# Patient Record
Sex: Male | Born: 1945 | Race: White | Hispanic: No | Smoking: Current every day smoker
Health system: Southern US, Community
[De-identification: ages and names within clinical notes are randomized; demographics above are authoritative.]

## PROBLEM LIST (undated history)

## (undated) DIAGNOSIS — E876 Hypokalemia: Secondary | ICD-10-CM

## (undated) DIAGNOSIS — R55 Syncope and collapse: Secondary | ICD-10-CM

## (undated) DIAGNOSIS — R269 Unspecified abnormalities of gait and mobility: Secondary | ICD-10-CM

## (undated) DIAGNOSIS — M4712 Other spondylosis with myelopathy, cervical region: Secondary | ICD-10-CM

## (undated) DIAGNOSIS — R4781 Slurred speech: Secondary | ICD-10-CM

## (undated) DIAGNOSIS — N529 Male erectile dysfunction, unspecified: Secondary | ICD-10-CM

## (undated) DIAGNOSIS — N400 Enlarged prostate without lower urinary tract symptoms: Secondary | ICD-10-CM

## (undated) DIAGNOSIS — S065X9A Traumatic subdural hemorrhage with loss of consciousness of unspecified duration, initial encounter: Secondary | ICD-10-CM

## (undated) DIAGNOSIS — G609 Hereditary and idiopathic neuropathy, unspecified: Secondary | ICD-10-CM

## (undated) DIAGNOSIS — D649 Anemia, unspecified: Secondary | ICD-10-CM

## (undated) DIAGNOSIS — K275 Chronic or unspecified peptic ulcer, site unspecified, with perforation: Secondary | ICD-10-CM

## (undated) DIAGNOSIS — K279 Peptic ulcer, site unspecified, unspecified as acute or chronic, without hemorrhage or perforation: Secondary | ICD-10-CM

## (undated) DIAGNOSIS — R1319 Other dysphagia: Secondary | ICD-10-CM

## (undated) DIAGNOSIS — G959 Disease of spinal cord, unspecified: Secondary | ICD-10-CM

## (undated) DIAGNOSIS — E785 Hyperlipidemia, unspecified: Secondary | ICD-10-CM

## (undated) DIAGNOSIS — M1990 Unspecified osteoarthritis, unspecified site: Secondary | ICD-10-CM

## (undated) DIAGNOSIS — Z720 Tobacco use: Secondary | ICD-10-CM

## (undated) DIAGNOSIS — F419 Anxiety disorder, unspecified: Secondary | ICD-10-CM

## (undated) HISTORY — DX: Hypokalemia: E87.6

## (undated) HISTORY — DX: Anemia, unspecified: D64.9

## (undated) HISTORY — DX: Male erectile dysfunction, unspecified: N52.9

## (undated) HISTORY — DX: Anxiety disorder, unspecified: F41.9

## (undated) HISTORY — PX: HEMORRHOID SURGERY: SHX153

## (undated) HISTORY — DX: Disease of spinal cord, unspecified: G95.9

## (undated) HISTORY — DX: Other dysphagia: R13.19

## (undated) HISTORY — DX: Hereditary and idiopathic neuropathy, unspecified: G60.9

## (undated) HISTORY — DX: Unspecified abnormalities of gait and mobility: R26.9

## (undated) HISTORY — PX: PARTIAL GASTRECTOMY: SHX2172

## (undated) HISTORY — DX: Tobacco use: Z72.0

## (undated) HISTORY — DX: Benign prostatic hyperplasia without lower urinary tract symptoms: N40.0

## (undated) HISTORY — DX: Traumatic subdural hemorrhage with loss of consciousness of unspecified duration, initial encounter: S06.5X9A

## (undated) HISTORY — DX: Peptic ulcer, site unspecified, unspecified as acute or chronic, without hemorrhage or perforation: K27.9

## (undated) HISTORY — DX: Unspecified osteoarthritis, unspecified site: M19.90

## (undated) HISTORY — DX: Slurred speech: R47.81

## (undated) HISTORY — DX: Syncope and collapse: R55

## (undated) HISTORY — DX: Hyperlipidemia, unspecified: E78.5

## (undated) HISTORY — DX: Other spondylosis with myelopathy, cervical region: M47.12

---

## 2002-12-27 ENCOUNTER — Encounter: Admission: RE | Admit: 2002-12-27 | Discharge: 2002-12-27 | Payer: Self-pay | Admitting: Family Medicine

## 2003-01-12 ENCOUNTER — Ambulatory Visit (HOSPITAL_COMMUNITY): Admission: RE | Admit: 2003-01-12 | Discharge: 2003-01-12 | Payer: Self-pay | Admitting: Family Medicine

## 2003-01-23 ENCOUNTER — Emergency Department (HOSPITAL_COMMUNITY): Admission: EM | Admit: 2003-01-23 | Discharge: 2003-01-23 | Payer: Self-pay | Admitting: Emergency Medicine

## 2003-03-21 ENCOUNTER — Encounter: Admission: RE | Admit: 2003-03-21 | Discharge: 2003-03-30 | Payer: Self-pay | Admitting: Occupational Medicine

## 2003-04-05 ENCOUNTER — Encounter: Admission: RE | Admit: 2003-04-05 | Discharge: 2003-04-05 | Payer: Self-pay | Admitting: Family Medicine

## 2003-07-03 ENCOUNTER — Ambulatory Visit (HOSPITAL_BASED_OUTPATIENT_CLINIC_OR_DEPARTMENT_OTHER): Admission: RE | Admit: 2003-07-03 | Discharge: 2003-07-03 | Payer: Self-pay | Admitting: Orthopedic Surgery

## 2004-03-22 ENCOUNTER — Ambulatory Visit: Payer: Self-pay | Admitting: Family Medicine

## 2008-02-12 ENCOUNTER — Emergency Department (HOSPITAL_COMMUNITY): Admission: EM | Admit: 2008-02-12 | Discharge: 2008-02-13 | Payer: Self-pay | Admitting: Emergency Medicine

## 2008-03-03 ENCOUNTER — Encounter: Admission: RE | Admit: 2008-03-03 | Discharge: 2008-03-03 | Payer: Self-pay | Admitting: Neurology

## 2008-03-22 ENCOUNTER — Inpatient Hospital Stay (HOSPITAL_COMMUNITY): Admission: RE | Admit: 2008-03-22 | Discharge: 2008-03-24 | Payer: Self-pay | Admitting: Neurosurgery

## 2008-07-18 ENCOUNTER — Encounter: Admission: RE | Admit: 2008-07-18 | Discharge: 2008-07-18 | Payer: Self-pay | Admitting: Internal Medicine

## 2008-08-22 ENCOUNTER — Encounter: Payer: Self-pay | Admitting: Internal Medicine

## 2008-08-25 ENCOUNTER — Emergency Department (HOSPITAL_COMMUNITY): Admission: EM | Admit: 2008-08-25 | Discharge: 2008-08-25 | Payer: Self-pay | Admitting: Emergency Medicine

## 2008-09-06 ENCOUNTER — Ambulatory Visit: Payer: Self-pay | Admitting: Internal Medicine

## 2008-09-06 DIAGNOSIS — D649 Anemia, unspecified: Secondary | ICD-10-CM

## 2008-09-06 DIAGNOSIS — R1319 Other dysphagia: Secondary | ICD-10-CM

## 2008-09-06 DIAGNOSIS — D539 Nutritional anemia, unspecified: Secondary | ICD-10-CM | POA: Insufficient documentation

## 2008-09-06 HISTORY — DX: Anemia, unspecified: D64.9

## 2008-09-06 HISTORY — DX: Other dysphagia: R13.19

## 2008-09-13 ENCOUNTER — Telehealth (INDEPENDENT_AMBULATORY_CARE_PROVIDER_SITE_OTHER): Payer: Self-pay | Admitting: *Deleted

## 2008-09-14 ENCOUNTER — Ambulatory Visit: Payer: Self-pay | Admitting: Internal Medicine

## 2008-09-14 LAB — CONVERTED CEMR LAB
Ferritin: 249.9 ng/mL (ref 22.0–322.0)
Folate: 14.8 ng/mL
Iron: 80 ug/dL (ref 42–165)
Saturation Ratios: 29.5 % (ref 20.0–50.0)
Transferrin: 194 mg/dL — ABNORMAL LOW (ref 212.0–360.0)
Vitamin B-12: 564 pg/mL (ref 211–911)

## 2008-09-18 ENCOUNTER — Encounter: Payer: Self-pay | Admitting: Internal Medicine

## 2008-09-22 ENCOUNTER — Ambulatory Visit (HOSPITAL_COMMUNITY): Admission: RE | Admit: 2008-09-22 | Discharge: 2008-09-22 | Payer: Self-pay | Admitting: Internal Medicine

## 2008-09-22 ENCOUNTER — Ambulatory Visit: Payer: Self-pay | Admitting: Internal Medicine

## 2009-02-24 HISTORY — PX: BACK SURGERY: SHX140

## 2010-01-14 ENCOUNTER — Observation Stay (HOSPITAL_COMMUNITY): Admission: EM | Admit: 2010-01-14 | Discharge: 2010-01-16 | Payer: Self-pay | Admitting: Emergency Medicine

## 2010-01-15 ENCOUNTER — Encounter (INDEPENDENT_AMBULATORY_CARE_PROVIDER_SITE_OTHER): Payer: Self-pay | Admitting: Internal Medicine

## 2010-03-18 ENCOUNTER — Encounter: Payer: Self-pay | Admitting: Internal Medicine

## 2010-03-28 NOTE — Miscellaneous (Signed)
Summary: Waiver of Liability/[employer], industrial/product of Liability/[facility]   Imported By: Lester Manito 09/08/2008 09:59:25  _____________________________________________________________________  External Attachment:    Type:   Image     Comment:   External Document

## 2010-05-07 LAB — CK TOTAL AND CKMB (NOT AT ARMC)
CK, MB: 5.8 ng/mL — ABNORMAL HIGH (ref 0.3–4.0)
CK, MB: 7.4 ng/mL (ref 0.3–4.0)
Relative Index: 1.6 (ref 0.0–2.5)
Relative Index: 1.7 (ref 0.0–2.5)
Total CK: 380 U/L — ABNORMAL HIGH (ref 7–232)
Total CK: 470 U/L — ABNORMAL HIGH (ref 7–232)

## 2010-05-07 LAB — BASIC METABOLIC PANEL
BUN: 4 mg/dL — ABNORMAL LOW (ref 6–23)
BUN: 6 mg/dL (ref 6–23)
BUN: 6 mg/dL (ref 6–23)
CO2: 24 mEq/L (ref 19–32)
CO2: 26 mEq/L (ref 19–32)
CO2: 27 mEq/L (ref 19–32)
Calcium: 8.8 mg/dL (ref 8.4–10.5)
Calcium: 8.9 mg/dL (ref 8.4–10.5)
Chloride: 110 mEq/L (ref 96–112)
Chloride: 110 mEq/L (ref 96–112)
Creatinine, Ser: 0.75 mg/dL (ref 0.4–1.5)
Creatinine, Ser: 0.98 mg/dL (ref 0.4–1.5)
GFR calc Af Amer: >60 mL/min (ref >60)
GFR calc Af Amer: >60 mL/min (ref >60)
GFR calc non Af Amer: >60 mL/min (ref >60)
GFR calc non Af Amer: >60 mL/min (ref >60)
Glucose, Bld: 79 mg/dL (ref 70–99)
Glucose, Bld: 85 mg/dL (ref 70–99)
Glucose, Bld: 98 mg/dL (ref 70–99)
Potassium: 3.7 mEq/L (ref 3.5–5.1)
Potassium: 3.8 mEq/L (ref 3.5–5.1)
Potassium: 4 mEq/L (ref 3.5–5.1)
Sodium: 139 mEq/L (ref 135–145)
Sodium: 142 mEq/L (ref 135–145)
Sodium: 142 mEq/L (ref 135–145)

## 2010-05-07 LAB — POCT I-STAT, CHEM 8
BUN: 5 mg/dL — ABNORMAL LOW (ref 6–23)
Calcium, Ion: 1.09 mmol/L — ABNORMAL LOW (ref 1.12–1.32)
Chloride: 109 mEq/L (ref 96–112)
Creatinine, Ser: 1.2 mg/dL (ref 0.4–1.5)
Glucose, Bld: 87 mg/dL (ref 70–99)
HCT: 36 % — ABNORMAL LOW (ref 39.0–52.0)
Hemoglobin: 12.2 g/dL — ABNORMAL LOW (ref 13.0–17.0)
Potassium: 4 mEq/L (ref 3.5–5.1)
Sodium: 140 mEq/L (ref 135–145)
TCO2: 22 mmol/L (ref 0–100)

## 2010-05-07 LAB — DIFFERENTIAL
Basophils Absolute: 0 10*3/uL (ref 0.0–0.1)
Basophils Relative: 0 % (ref 0–1)
Eosinophils Absolute: 0.2 10*3/uL (ref 0.0–0.7)
Eosinophils Relative: 2 % (ref 0–5)
Lymphocytes Relative: 17 % (ref 12–46)
Lymphs Abs: 1.7 10*3/uL (ref 0.7–4.0)
Monocytes Absolute: 0.7 10*3/uL (ref 0.1–1.0)
Monocytes Relative: 7 % (ref 3–12)
Neutro Abs: 7.3 10*3/uL (ref 1.7–7.7)
Neutrophils Relative %: 74 % (ref 43–77)

## 2010-05-07 LAB — CBC
HCT: 34.4 % — ABNORMAL LOW (ref 39.0–52.0)
HCT: 34.7 % — ABNORMAL LOW (ref 39.0–52.0)
HCT: 35 % — ABNORMAL LOW (ref 39.0–52.0)
Hemoglobin: 11.7 g/dL — ABNORMAL LOW (ref 13.0–17.0)
Hemoglobin: 11.8 g/dL — ABNORMAL LOW (ref 13.0–17.0)
Hemoglobin: 11.8 g/dL — ABNORMAL LOW (ref 13.0–17.0)
MCH: 33.9 pg (ref 26.0–34.0)
MCH: 34 pg (ref 26.0–34.0)
MCH: 34.1 pg — ABNORMAL HIGH (ref 26.0–34.0)
MCHC: 33.7 g/dL (ref 30.0–36.0)
MCHC: 34 g/dL (ref 30.0–36.0)
MCHC: 34 g/dL (ref 30.0–36.0)
MCV: 100 fL (ref 78.0–100.0)
MCV: 100.6 fL — ABNORMAL HIGH (ref 78.0–100.0)
Platelets: 229 10*3/uL (ref 150–400)
RBC: 3.44 MIL/uL — ABNORMAL LOW (ref 4.22–5.81)
RBC: 3.46 MIL/uL — ABNORMAL LOW (ref 4.22–5.81)
RDW: 13.7 % (ref 11.5–15.5)
RDW: 14 % (ref 11.5–15.5)
WBC: 9.9 10*3/uL (ref 4.0–10.5)

## 2010-05-07 LAB — TROPONIN I
Troponin I: 0.01 ng/mL (ref 0.00–0.06)
Troponin I: 0.01 ng/mL (ref 0.00–0.06)

## 2010-05-07 LAB — CORTISOL-AM, BLOOD: Cortisol - AM: 6.4 ug/dL (ref 4.3–22.4)

## 2010-05-07 LAB — POCT CARDIAC MARKERS
CKMB, poc: 1.9 ng/mL (ref 1.0–8.0)
Myoglobin, poc: 265 ng/mL (ref 12–200)
Troponin i, poc: <0.05 ng/mL (ref 0.00–0.09)

## 2010-05-07 LAB — TSH: TSH: 1.337 u[IU]/mL (ref 0.350–4.500)

## 2010-06-02 LAB — CBC
HCT: 34.3 % — ABNORMAL LOW (ref 39.0–52.0)
MCHC: 35 g/dL (ref 30.0–36.0)
MCV: 103.7 fL — ABNORMAL HIGH (ref 78.0–100.0)
Platelets: ADEQUATE 10*3/uL (ref 150–400)
RDW: 13.9 % (ref 11.5–15.5)

## 2010-06-02 LAB — POCT I-STAT, CHEM 8
BUN: 10 mg/dL (ref 6–23)
Calcium, Ion: 1.24 mmol/L (ref 1.12–1.32)
HCT: 35 % — ABNORMAL LOW (ref 39.0–52.0)
Hemoglobin: 11.9 g/dL — ABNORMAL LOW (ref 13.0–17.0)
Sodium: 141 mEq/L (ref 135–145)
TCO2: 27 mmol/L (ref 0–100)

## 2010-06-02 LAB — HEMOCCULT GUIAC POC 1CARD (OFFICE): Fecal Occult Bld: NEGATIVE

## 2010-06-10 LAB — CBC
HCT: 41.7 % (ref 39.0–52.0)
Platelets: 238 10*3/uL (ref 150–400)
RDW: 12.3 % (ref 11.5–15.5)
WBC: 7.9 10*3/uL (ref 4.0–10.5)

## 2010-07-09 NOTE — Op Note (Signed)
NAME:  Curtis Gentry, Curtis Gentry NO.:  [id number]   MEDICAL RECORD NO.:  [id number]          PATIENT TYPE:  INP   LOCATION:  3533                         FACILITY:  [facility]   PHYSICIAN:  Cristi Loron, M.D.DATE OF BIRTH:  1945-07-16   DATE OF PROCEDURE:  DATE OF DISCHARGE:                               OPERATIVE REPORT   BRIEF HISTORY:  The patient is a 65 year old white male who had suffered  from neck and arm pain with weakness, consistent with a cervical  myelopathy.  He was worked up with a cervical CT and MRI, which  demonstrated he had severe stenosis at C3-4, C4-5, and C6-7 and evidence  of spinal cord signal change.  I discussed the various treatment options  with the patient including surgery.  He has weighed the risks, benefits,  and alternatives of the surgery and decided to proceed with the surgery,  i.e., C3-4, C4-5, and C6-7 anterior cervical diskectomy fusion and  plating.   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   PROCEDURE:  C3-4, C4-5, C6-7 extensive anterior cervical  diskectomy/decompression; C3-4, C4-5 and C6-7 anterior interbody  arthrodesis with local morselized autograft bone and active fused bone  graft extender; insertion of C3-4, C4-5 and C6-7 interbody prosthesis  (Novel PEEK interbody prosthesis); C3, C5, and C6-7 intracervical  plating with Codman Slim-Loc titanium plate and screws.   SURGEON:  Cristi Loron, MD   ASSISTANT:  Payton Doughty, MD   ANESTHESIA:  General endotracheal.   ESTIMATED BLOOD LOSS:  100 mL.   SPECIMENS:  None.   DRAINS:  One prevertebral Jackson-Pratt drain.   COMPLICATIONS:  None.   DESCRIPTION OF PROCEDURE:  The patient was brought to the operating  by  the anesthesia team.  General endotracheal anesthesia was induced.  The  patient remained in the supine position.  A roll was placed under  shoulders and placed his neck in slight extension.  His anterior  cervical region was then prepared with Betadine scrub with Betadine  solution after we shaved with the clippers.  Sterile drapes were  applied.  I then injected the area to be incised with Marcaine with  epinephrine solution.  I used a scalpel to make a transverse incision in  the patient's left anterior neck.  I used a Metzenbaum scissors to  divide the platysma muscle and then to dissect medial to  sternocleidomastoid muscle, jugular vein, and carotid artery.  I  carefully dissected down towards the anterior cervical spine identifying  the esophagus and retracting it medially.  I then used skin swabs to  clear the soft tissue from the anterior cervical spine, and then  inserted a bent spinal needle into the upper exposed intervertebral disk  space.  We obtained intraoperative radiograph to confirm our location.  I then used electrocautery to detach the medial border of the longus  colli muscle bilaterally from the C3-4, C4-5, C6-7  intervertebral disk  space.  We inserted a Caspar self-retaining retractor underneath the  longus colli muscle bilaterally to provide exposure.  We began  decompression at C3-4.  I incised the C3-4 intervertebral disk with a 15-  blade scalpel and performed a partial intervertebral diskectomy with a  pituitary forceps and Carlen curettes.  I inserted traction screws to C3-  4, distracted interspace, and then used a high-speed drill to  decorticate the vertebral endplates, drilled away the remaining  intervertebral disk to thin out the posterior longitudinal ligament, and  drill away some posterior spondylosis.  We then incised the thinned out  ligament with arachnoid knife and then removed it with Kerrison punch  undercutting the vertebral endplates at  C3-4 decompressing the thecal  sac.  We then performed foraminotomies above the bilateral C4 nerve root  completing the decompression at this level.   We then repeated this decompression in an analogous fashion at C4-5 and  C6-7, decompressing the C4-5, C6-7 thecal sac  and a bilateral C5 and C7  nerve roots, this completed our decompression.  We now turned our  attention to the arthrodesis.  We used the trial spacers and determined  the use a medium 6-mm Novel PEEK interbody prosthesis at C3-4 and C4-5,  and a medium 7-mm prosthesis at C6-7.  We prefilled this prosthesis with  combination of local autograft bone we obtained during the decompression  as well as Actifuse bone graft extender.  We inserted the prosthesis  into distracted interspaces and then removed distraction screws.  There  was a good snug fit of the prosthesis in each level.   We now turned the attention to instrumentation.  We obtained appropriate  length Codman Slim-Loc anterior cervical plate and bent to the  appropriate lordosis.  We laid it along the vertebral bodies from C3,  C5, and at C6-7.  We then drilled to 40-mm holes at C3-4, C5, C6, and  C7.  We secured the plates to the vertebral bodies by placing two 40-mm  self-tapping screws at the C3-4, C5, and as well as C6-7.  We then  obtained intraoperative radiograph in demonstrating good position of  plate, screws, interbody prosthesis.  We therefore secured the screws  and plate by locking each cam.   We then obtained hemostasis using bipolar electrocautery.  We irrigated  the wound out with bacitracin solution.  We then removed the retractor  and inspected the esophagus for any damage, there was none apparent.  We  placed a 10-mm flat Jackson-Pratt drain in the prevertebral space and  tunneled it out through a seperate stab wound.  We then reapproximated  the patient's platysma muscle with interrupted 3-0 Vicryl suture and  subcutaneous tissue with  interrupted 3-0 Vicryl suture and skin with  Steri-Strips and Benzoin.  The wound was then coated with bacitracin  ointment.  A sterile dressing was applied.  The drapes were removed.  The patient was subsequently extubated by the anesthesia team and  transported to post anesthesia care unit in stable condition.  All  sponge, instrument, and needle counts were correct at the end of this  case.      Cristi Loron, M.D.  Electronically Signed     JDJ/MEDQ  D:  03/22/2008  T:  03/23/2008  Job:  [id number]

## 2010-07-12 NOTE — Op Note (Signed)
NAME:  TAJI, BARRETTO                           ACCOUNT NO.:  [id number]   MEDICAL RECORD NO.:  [id number]                   PATIENT TYPE:  AMB   LOCATION:  DSC                                  FACILITY:  [facility]   PHYSICIAN:  Feliberto Gottron. Turner Daniels, M.D.                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   PROCEDURE:  Left carpal tunnel release, left Guyon's canal release.   SURGEON:  Feliberto Gottron. Turner Daniels, M.D.   FIRST ASSISTANT:  Skip Mayer, P.A.-C.   ANESTHESIA:  IV regional.   ESTIMATED BLOOD LOSS:  Minimal.   FLUID REPLACEMENT:  800 mL crystalloid.   DRAINS PLACED:  None.   TOURNIQUET TIME:  20 minutes.   INDICATIONS:  This 65 year old gentleman with EMG proven severe left carpal  tunnel syndrome and ulnar nerve impingement or entrapment at Guyon's canal.  He was taken for elective decompression of both and has failed conservative  treatment with splinting and anti-inflammatory medicine and because of  excellent drop out is a candidate for a sooner than later release.   DESCRIPTION OF PROCEDURE:  The patient was identified by the arm band and  taken to the operating room at [facility].  Appropriate  anesthetic monitors were attached and IV regional anesthesia induced into  the left upper extremity.  The left hand was prepped and draped into the  usual sterile fashion from the fingertips to the forearm tourniquet.  We  began the procedure by making a volar incision starting at the wrist flexion  crease and going distally and 2 mm to the ulnar side of the thenar crease.  Smaller bleeders in the skin and subcutaneous tissue were identified and  cauterized.  The length of the incision was about 5 cm.  We found the palmar  fascia and made a small incision in it distally, passed a Therapist, nutritional  into the carpal tunnel and cut down on the [place] elevator keeping medial and  lateral traction performing a carpal tunnel release which was extended into  the forearm fascia with the black-handled scissors.  We then found the ulnar  artery and neurovascular bundle distally and traced it back to Guyon's canal  and then carefully decompressed Guyon's canal to the ulnar side of the  carpal tunnel until this bundle  was also freed up.  The wound was then irrigated out with normal saline  solution and the skin only closed with a running 5-0 nylon suture.  A  dressing of Xeroform, 4 x 4 dressings, sponges, Webril, and an Ace wrap were  applied.  The patient was then awakened and taken to the recovery room  without difficulty.                                               Feliberto Gottron. Turner Daniels, M.D.    Ovid Curd  D:  07/03/2003  T:  07/03/2003  Job:  [id number]

## 2010-11-29 LAB — BASIC METABOLIC PANEL
BUN: 8 mg/dL (ref 6–23)
GFR calc Af Amer: >60 mL/min (ref >60)
GFR calc non Af Amer: >60 mL/min (ref >60)
Potassium: 4.1 mEq/L (ref 3.5–5.1)

## 2010-11-29 LAB — DIFFERENTIAL
Lymphocytes Relative: 27 % (ref 12–46)
Lymphs Abs: 1.6 10*3/uL (ref 0.7–4.0)
Monocytes Relative: 10 % (ref 3–12)
Neutrophils Relative %: 58 % (ref 43–77)

## 2010-11-29 LAB — CBC
HCT: 45.2 % (ref 39.0–52.0)
Platelets: 198 10*3/uL (ref 150–400)
RBC: 4.12 MIL/uL — ABNORMAL LOW (ref 4.22–5.81)
WBC: 6.1 10*3/uL (ref 4.0–10.5)

## 2010-11-29 LAB — POCT CARDIAC MARKERS
Myoglobin, poc: 196 ng/mL (ref 12–200)
Troponin i, poc: <0.05 ng/mL (ref 0.00–0.09)

## 2011-08-20 ENCOUNTER — Observation Stay (HOSPITAL_COMMUNITY)
Admission: EM | Admit: 2011-08-20 | Discharge: 2011-08-21 | Disposition: A | Discharge status: Home / Self Care | Payer: Medicare Other | Attending: Internal Medicine | Admitting: Internal Medicine

## 2011-08-20 ENCOUNTER — Emergency Department (HOSPITAL_COMMUNITY): Payer: Medicare Other

## 2011-08-20 ENCOUNTER — Encounter (HOSPITAL_COMMUNITY): Payer: Self-pay

## 2011-08-20 DIAGNOSIS — R55 Syncope and collapse: Secondary | ICD-10-CM

## 2011-08-20 DIAGNOSIS — I951 Orthostatic hypotension: Secondary | ICD-10-CM

## 2011-08-20 DIAGNOSIS — F17200 Nicotine dependence, unspecified, uncomplicated: Secondary | ICD-10-CM | POA: Insufficient documentation

## 2011-08-20 DIAGNOSIS — D649 Anemia, unspecified: Secondary | ICD-10-CM

## 2011-08-20 DIAGNOSIS — R1319 Other dysphagia: Secondary | ICD-10-CM

## 2011-08-20 DIAGNOSIS — J438 Other emphysema: Secondary | ICD-10-CM | POA: Insufficient documentation

## 2011-08-20 DIAGNOSIS — E782 Mixed hyperlipidemia: Secondary | ICD-10-CM

## 2011-08-20 DIAGNOSIS — E785 Hyperlipidemia, unspecified: Secondary | ICD-10-CM | POA: Diagnosis present

## 2011-08-20 DIAGNOSIS — E876 Hypokalemia: Secondary | ICD-10-CM | POA: Diagnosis present

## 2011-08-20 HISTORY — DX: Chronic or unspecified peptic ulcer, site unspecified, with perforation: K27.5

## 2011-08-20 HISTORY — DX: Syncope and collapse: R55

## 2011-08-20 LAB — CBC
HCT: 33 % — ABNORMAL LOW (ref 39.0–52.0)
HCT: 38.2 % — ABNORMAL LOW (ref 39.0–52.0)
Hemoglobin: 12.9 g/dL — ABNORMAL LOW (ref 13.0–17.0)
MCH: 33.6 pg (ref 26.0–34.0)
MCHC: 34.5 g/dL (ref 30.0–36.0)
MCHC: 33.8 g/dL (ref 30.0–36.0)
MCV: 97.3 fL (ref 78.0–100.0)
MCV: 100.5 fL — ABNORMAL HIGH (ref 78.0–100.0)
RDW: 13.9 % (ref 11.5–15.5)
RDW: 14.1 % (ref 11.5–15.5)
WBC: 7 10*3/uL (ref 4.0–10.5)

## 2011-08-20 LAB — CREATININE, SERUM: GFR calc Af Amer: >90 mL/min (ref >90)

## 2011-08-20 LAB — CARDIAC PANEL(CRET KIN+CKTOT+MB+TROPI)
CK, MB: 3.9 ng/mL (ref 0.3–4.0)
Relative Index: INVALID (ref 0.0–2.5)
Total CK: 80 U/L (ref 7–232)

## 2011-08-20 LAB — BASIC METABOLIC PANEL
BUN: 7 mg/dL (ref 6–23)
Calcium: 8.5 mg/dL (ref 8.4–10.5)
Creatinine, Ser: 1.03 mg/dL (ref 0.50–1.35)
GFR calc Af Amer: 85 mL/min — ABNORMAL LOW (ref >90)
GFR calc non Af Amer: 74 mL/min — ABNORMAL LOW (ref >90)

## 2011-08-20 LAB — URINALYSIS, ROUTINE W REFLEX MICROSCOPIC
Bilirubin Urine: NEGATIVE
Hgb urine dipstick: NEGATIVE
Specific Gravity, Urine: 1.009 (ref 1.005–1.030)
pH: 5.5 (ref 5.0–8.0)

## 2011-08-20 LAB — MAGNESIUM: Magnesium: 1.8 mg/dL (ref 1.5–2.5)

## 2011-08-20 MED ORDER — ALBUTEROL SULFATE (5 MG/ML) 0.5% IN NEBU
2.5000 mg | INHALATION_SOLUTION | Freq: Four times a day (QID) | RESPIRATORY_TRACT | Status: DC
  Administered 2011-08-20 – 2011-08-21 (×2): 2.5 mg via RESPIRATORY_TRACT
  Filled 2011-08-20 (×2): qty 0.5

## 2011-08-20 MED ORDER — SODIUM CHLORIDE 0.9 % IV BOLUS (SEPSIS)
1000.0000 mL | Freq: Once | INTRAVENOUS | Status: AC
  Administered 2011-08-20: 1000 mL via INTRAVENOUS

## 2011-08-20 MED ORDER — ATORVASTATIN CALCIUM 10 MG PO TABS
10.0000 mg | ORAL_TABLET | Freq: Every day | ORAL | Status: DC
  Administered 2011-08-21: 10 mg via ORAL
  Filled 2011-08-20: qty 1

## 2011-08-20 MED ORDER — POLYETHYLENE GLYCOL 3350 17 G PO PACK
17.0000 g | PACK | Freq: Every day | ORAL | Status: DC | PRN
  Filled 2011-08-20: qty 1

## 2011-08-20 MED ORDER — ONDANSETRON HCL 4 MG/2ML IJ SOLN: 4.0000 mg | Freq: Four times a day (QID) | INTRAMUSCULAR | Status: DC | PRN

## 2011-08-20 MED ORDER — DIAZEPAM 5 MG PO TABS: 5.0000 mg | ORAL_TABLET | Freq: Four times a day (QID) | ORAL | Status: DC | PRN

## 2011-08-20 MED ORDER — ACETAMINOPHEN 325 MG PO TABS: 650.0000 mg | ORAL_TABLET | Freq: Four times a day (QID) | ORAL | Status: DC | PRN

## 2011-08-20 MED ORDER — SODIUM CHLORIDE 0.9 % IV SOLN
INTRAVENOUS | Status: AC
  Administered 2011-08-20: 20:00:00 via INTRAVENOUS

## 2011-08-20 MED ORDER — ONDANSETRON HCL 4 MG PO TABS: 4.0000 mg | ORAL_TABLET | Freq: Four times a day (QID) | ORAL | Status: DC | PRN

## 2011-08-20 MED ORDER — HYDROCODONE-ACETAMINOPHEN 5-325 MG PO TABS: 1.0000 | ORAL_TABLET | ORAL | Status: DC | PRN

## 2011-08-20 MED ORDER — ACETAMINOPHEN 650 MG RE SUPP: 650.0000 mg | Freq: Four times a day (QID) | RECTAL | Status: DC | PRN

## 2011-08-20 MED ORDER — SODIUM CHLORIDE 0.9 % IV SOLN: INTRAVENOUS | Status: AC

## 2011-08-20 MED ORDER — SODIUM CHLORIDE 0.9 % IJ SOLN
3.0000 mL | Freq: Two times a day (BID) | INTRAMUSCULAR | Status: DC
  Administered 2011-08-20 – 2011-08-21 (×2): 3 mL via INTRAVENOUS

## 2011-08-20 MED ORDER — HEPARIN SODIUM (PORCINE) 5000 UNIT/ML IJ SOLN
5000.0000 [IU] | Freq: Three times a day (TID) | INTRAMUSCULAR | Status: DC
  Administered 2011-08-20 – 2011-08-21 (×3): 5000 [IU] via SUBCUTANEOUS
  Filled 2011-08-20 (×5): qty 1

## 2011-08-20 NOTE — ED Provider Notes (Signed)
I saw and evaluated the patient, reviewed the resident's note and I agree with the findings and plan.   Shayda Kalka, MD 08/20/11 2351 

## 2011-08-20 NOTE — H&P (Signed)
[facility] History and Physical  Curtis Gentry ZOX:[id number] DOB: 15-Sep-1945 DOA: 08/20/2011   PCP: Hoyle Sauer, MD   Chief Complaint: Syncope and collapse  HPI:  This is a 66 year old male with no significant past medical history is ongoing tobacco abuse one pack a day that comes in for syncopal episode this afternoon. He relates he was mowing his lawn when this happened. He sat down and he light up a cigarette and then lost consciousness. His Levoxine. He was unresponsive. For about 30-50 seconds. He relates no prodromal symptoms no palpitations , chest pain shortness of breath, notes recent diarrhea, no fever or melanotic stools. There is no seizure activity no loss of control of sphincters.  Review of Systems:  Constitutional:  No weight loss, night sweats, Fevers, chills, fatigue.  HEENT:  No headaches, Difficulty swallowing,Tooth/dental problems,Sore throat,  No sneezing, itching, ear ache, nasal congestion, post nasal drip,  Cardio-vascular:  No chest pain, Orthopnea, PND, swelling in lower extremities, anasarca, dizziness, palpitations  GI:  No heartburn, indigestion, abdominal pain, nausea, vomiting, diarrhea, change in bowel habits, loss of appetite  Resp:  No shortness of breath with exertion or at rest. No excess mucus, no productive cough, No non-productive cough, No coughing up of blood.No change in color of mucus.No wheezing.No chest wall deformity  Skin:  no rash or lesions.  GU:  no dysuria, change in color of urine, no urgency or frequency. No flank pain.  Musculoskeletal:  No joint pain or swelling. No decreased range of motion. No back pain.  Psych:  No change in mood or affect. No depression or anxiety. No memory loss.    Past Medical History  Diagnosis Date  . Perforated ulcer    Past Surgical History  Procedure Date  . Back surgery    Social History:  reports that he has been smoking.  He does not have any smokeless tobacco history on file.  He reports that he does not drink alcohol or use illicit drugs.  No Known Allergies  Family History  Problem Relation Age of Onset  . Stroke Mother   . COPD Father   . Heart attack Father     Prior to Admission medications   Medication Sig Start Date End Date Taking? Authorizing Provider  atorvastatin (LIPITOR) 10 MG tablet Take 10 mg by mouth daily.   Yes Historical Provider, MD  diazepam (VALIUM) 5 MG tablet Take 5 mg by mouth every 6 (six) hours as needed. For nerves.   Yes Historical Provider, MD  HYDROcodone-acetaminophen (VICODIN) 5-500 MG per tablet Take 1 tablet by mouth 4 (four) times daily as needed. For pain.   Yes Historical Provider, MD   Physical Exam: Filed Vitals:   08/20/11 1516 08/20/11 1518 08/20/11 1520 08/20/11 1530  BP: 61/47 59/46 96/58  92/61  Pulse: 73 81  71  Temp:      TempSrc:      Resp:    21  SpO2:    98%   BP 92/61  Pulse 71  Temp 97.3 F (36.3 C) (Oral)  Resp 21  SpO2 98%  General Appearance:    Alert, cooperative, no distress,  thin appearing appearsolder than his age   Head:    Normocephalic, without obvious abnormality, atraumatic  Eyes:    PERRL, conjunctiva/corneas clear, EOM's intact, fundi    benign, both eyes       Ears:    Normal TM's and external ear canals, both ears  Nose:   Nares normal, septum  midline, mucosa normal, no drainage    or sinus tenderness  Throat:  dry lips and mucosa.   Neck:   Supple, symmetrical, trachea midline, no adenopathy;       thyroid:  No enlargement/tenderness/nodules; no carotid   bruit or JVD  Back:     Symmetric, no curvature, ROM normal, no CVA tenderness  Lungs:     Clear to auscultation bilaterally, respirations unlabored  Chest wall:    No tenderness or deformity  Heart:    Regular rate and rhythm, S1 and S2 normal, no murmur, rub   or gallop  Abdomen:     Soft, non-tender, bowel sounds active all four quadrants,    no masses, no organomegaly        Extremities:   Extremities normal,  atraumatic, no cyanosis or edema  Pulses:   2+ and symmetric all extremities  Skin:   Skin color, texture, turgor normal, no rashes or lesions  Lymph nodes:   Cervical, supraclavicular, and axillary nodes normal  Neurologic:   CNII-XII intact. Normal strength, sensation and reflexes      throughout    Labs on Admission:  Basic Metabolic Panel:  Lab 08/20/11 1610  NA 139  K 3.7  CL 106  CO2 23  GLUCOSE 101*  BUN 7  CREATININE 1.03  CALCIUM 8.5  MG --  PHOS --   Liver Function Tests: No results found for this basename: AST:5,ALT:5,ALKPHOS:5,BILITOT:5,PROT:5,ALBUMIN:5 in the last 168 hours No results found for this basename: LIPASE:5,AMYLASE:5 in the last 168 hours No results found for this basename: AMMONIA:5 in the last 168 hours CBC:  Lab 08/20/11 1435  WBC 8.0  NEUTROABS --  HGB 11.4*  HCT 33.0*  MCV 97.3  PLT 189   Cardiac Enzymes: No results found for this basename: CKTOTAL:5,CKMB:5,CKMBINDEX:5,TROPONINI:5 in the last 168 hours BNP: No components found with this basename: POCBNP:5 CBG: No results found for this basename: GLUCAP:5 in the last 168 hours  Radiological Exams on Admission: Dg Chest 2 View  08/20/2011  *RADIOLOGY REPORT*  Clinical Data: Loss of consciousness.  Smoking history.  CHEST - 2 VIEW  Comparison: 01/14/2010  Findings: The lungs are hyperinflated with scarring consistent with diffuse emphysema.  No evidence of infiltrate, collapse or effusion.  Heart size is normal.  Mediastinal shadows are unremarkable.  No significant bony finding.  IMPRESSION: Severe diffuse emphysema.  No acute process evident.  Original Report Authenticated By: Thomasenia Sales, M.D.    EKG: Independently reviewed. Normal sinus rhythm  Assessment/Plan: Principal Problem:  *Syncope and collapse: Is most likely secondary to orthostatic hypotension. His blood pressure was low at the scene he was given a 250 cc bolus bthe EMS staff. Orthostatics were done in the ED that was  positive. The omega bolus. At this time his creatinine is at baseline as is his BUN. We'll go ahead and give him another bolus of IV fluids and continue him on 125 cc an hour for the next 24 hours. Will admit to telemetry check for any cardiac events, this is unlikely. We'll check a TSH and a course alone the morning. We'll check strict I.'s and O.'s and we'll cycle his cardiac enzymes. At this time he doesn't have a murmur. His loss of consciousness is not exertional. So I doubt this aortic stenosis.   Hyperlipidemia Continue statin  Time spend: 30 minute Code Status: Full code Family Communication: Clearence Cheek [phone] Disposition Plan: inpatient  Marinda Elk, MD  [facility] Pager [phone]  If 7PM-7AM, please contact night-coverage [email or web address] Password [facility] 08/20/2011, 4:34 PM

## 2011-08-20 NOTE — ED Provider Notes (Signed)
History     CSN: [id number]  Arrival date & time 08/20/11  1356   First MD Initiated Contact with Patient 08/20/11 1505      Chief Complaint  Patient presents with  . Loss of Consciousness    Patient is a 67 y.o. male presenting with syncope. The history is provided by the patient, the spouse and a relative.  Loss of Consciousness This is a new problem. The current episode started today (at approximately noon). Episode frequency: once today, while smoking a cigarette. The problem has been resolved. Pertinent negatives include no abdominal pain, chest pain, chills, coughing, fever, headaches, nausea, neck pain, numbness, rash, vomiting or weakness. Exacerbated by: Patient had been mowing his lawn this morning and then sat down aqnd was smoking a cigarette outside when he syncopized. No prodromal signs or symptoms. Patient is asymptomatic now.  He has tried rest (250 mL NS bolus) for the symptoms. The treatment provided moderate relief.    Past Medical History  Diagnosis Date  . Perforated ulcer     Past Surgical History  Procedure Date  . Back surgery     History reviewed. No pertinent family history.  History  Substance Use Topics  . Smoking status: Current Everyday Smoker -- 1.0 packs/day  . Smokeless tobacco: Not on file  . Alcohol Use: No      Review of Systems  Constitutional: Negative for fever, chills, activity change and appetite change.  HENT: Negative for neck pain and neck stiffness.   Respiratory: Negative for cough and shortness of breath.   Cardiovascular: Positive for syncope. Negative for chest pain, palpitations and leg swelling.  Gastrointestinal: Negative for nausea, vomiting, abdominal pain, diarrhea and constipation.  Skin: Negative for rash and wound.  Neurological: Positive for syncope. Negative for dizziness, seizures, facial asymmetry, speech difficulty, weakness, light-headedness, numbness and headaches.  All other systems reviewed and are  negative.    Allergies  Review of patient's allergies indicates no known allergies.  Home Medications   Current Outpatient Rx  Name Route Sig Dispense Refill  . ATORVASTATIN CALCIUM 10 MG PO TABS Oral Take 10 mg by mouth daily.    Marland Kitchen DIAZEPAM 5 MG PO TABS Oral Take 5 mg by mouth every 6 (six) hours as needed. For nerves.    Marland Kitchen HYDROCODONE-ACETAMINOPHEN 5-500 MG PO TABS Oral Take 1 tablet by mouth 4 (four) times daily as needed. For pain.      BP 96/58  Pulse 81  Temp 97.3 F (36.3 C) (Oral)  Resp 16  SpO2 98%  Physical Exam  Nursing note and vitals reviewed. Constitutional: He is oriented to person, place, and time. He appears well-developed and well-nourished.  HENT:  Head: Normocephalic and atraumatic.  Right Ear: External ear normal.  Left Ear: External ear normal.  Nose: Nose normal.  Mouth/Throat: Oropharynx is clear and moist. No oropharyngeal exudate.  Eyes: Conjunctivae are normal. Pupils are equal, round, and reactive to light.  Neck: Normal range of motion. Neck supple.  Cardiovascular: Normal rate, regular rhythm, normal heart sounds and intact distal pulses.   Pulmonary/Chest: Effort normal. No respiratory distress. He has wheezes. He has no rales. He exhibits no tenderness.  Abdominal: Soft. Bowel sounds are normal. He exhibits no distension and no mass. There is no tenderness. There is no rebound and no guarding.  Musculoskeletal: Normal range of motion. He exhibits no edema and no tenderness.  Neurological: He is alert and oriented to person, place, and time. He displays normal reflexes.  No cranial nerve deficit. He exhibits normal muscle tone. Coordination normal.       Decreased sensation to hands bilaterally, which patient states is at its baseline since a surgery to his upper back   Skin: Skin is warm and dry. No rash noted. No erythema. No pallor.  Psychiatric: He has a normal mood and affect. His behavior is normal. Judgment and thought content normal.     ED Course  Procedures (including critical care time)  Labs Reviewed  CBC - Abnormal; Notable for the following:    RBC 3.39 (*)     Hemoglobin 11.4 (*)     HCT 33.0 (*)     All other components within normal limits  BASIC METABOLIC PANEL - Abnormal; Notable for the following:    Glucose, Bld 101 (*)     GFR calc non Af Amer 74 (*)     GFR calc Af Amer 85 (*)     All other components within normal limits  POCT I-STAT TROPONIN I  URINALYSIS, ROUTINE W REFLEX MICROSCOPIC   Dg Chest 2 View  08/20/2011  *RADIOLOGY REPORT*  Clinical Data: Loss of consciousness.  Smoking history.  CHEST - 2 VIEW  Comparison: 01/14/2010  Findings: The lungs are hyperinflated with scarring consistent with diffuse emphysema.  No evidence of infiltrate, collapse or effusion.  Heart size is normal.  Mediastinal shadows are unremarkable.  No significant bony finding.  IMPRESSION: Severe diffuse emphysema.  No acute process evident.  Original Report Authenticated By: Thomasenia Sales, M.D.     1. Syncope   2. Orthostatic syncope      Date: 08/20/2011  Rate: 50 bpm  Rhythm: sinus bradycardia  QRS Axis: normal  Intervals: normal  ST/T Wave abnormalities: normal  Conduction Disutrbances:none  Narrative Interpretation: Sinus bradycardia without evidence of ischemia, arrythmia, or heart block  Old EKG Reviewed: unchanged    MDM  66 yo M presents after syncopal episode. No prodromal symptoms and patient awoke and returned to his baseline alertness quickly. Patient did not hit head but slumped over while smoking a cigarette. No burns from the cigarette. Patient's systolic blood pressure was 84 on scene, which improved to 112 with a NS bolus. SBP 93 here. No focal neuro signs or symptoms and picture not concerning for CVA; head CT not indicated. No dyspnea or chest pain and clinical picture not concerning for PE or ACS. Orthostatics positive and pt's response to IVF also suggests dehydration. Will continue  to rehydrate. Hospitalist service consulted and will admit.         Clemetine Marker, MD 08/20/11 1701

## 2011-08-20 NOTE — ED Notes (Signed)
Per ems- pt experienced syncopal episode at home while sitting on front porch. Pt did not fall. Per family pt was out for . Pt did urinate on self. Pt initial BP was 84 systolic. Pt given 250 bolus and BP went up to 112/58. Pt was alert and oriented entire trip with EMS. Pt c/o bilateral arm pain and dizziness prior to syncopal episode. Denies any cardiac history.

## 2011-08-20 NOTE — ED Notes (Signed)
Patient transported to X-ray 

## 2011-08-20 NOTE — ED Provider Notes (Signed)
66 year old male had a syncopal episode at home. He had been using the weed whack her and was sitting down the porch following that drinking some iced tea and smoking a cigarette when he was noted to pass out. He was not dizzy before then he denies any usual amount of sweating. Denies any chest pain. He did have urinary incontinence but no fecal incontinence. He did have a prior syncopal episode where he was admitted and had no clear cause was found. He was noted to be hypotensive in the field and in the ED, he is noted to have marked orthostatic drop in blood pressure. On exam, lungs are clear and heart has regular rate and rhythm. He is noted to have muscle wasting of the intrinsic muscles of his hands which he relates to being a sequelae of neck surgery. Old records were reviewed and indeed he was admitted in November of 2011 for syncope with no clear cause being found. He will need to be admitted for further workup. Also, his bradycardia is inappropriate for her degree of hypotension and may indicate some degree of sick sinus syndrome.   Date: 08/20/2011  Rate: 50  Rhythm: sinus bradycardia  QRS Axis: normal  Intervals: normal  ST/T Wave abnormalities: normal  Conduction Disutrbances:none  Narrative Interpretation: Sinus bradycardia, otherwise normal ECG. No old ECG available for comparison.  Old EKG Reviewed: unchanged    Dione Booze, MD 08/20/11 [phone]

## 2011-08-20 NOTE — ED Notes (Signed)
Pt states that he was resting, felt dizzy and then experienced syncopal episode. Pt states that he had this happen 1 year ago and "my doctor didn't find anything wrong with me." Pt in NAD. Pt states he has Bilateral arm pain but that is normal for him since he had back surgery. Denies any new complaints.

## 2011-08-20 NOTE — ED Notes (Signed)
Pt given peanut butter & crackers. 

## 2011-08-21 DIAGNOSIS — E876 Hypokalemia: Secondary | ICD-10-CM

## 2011-08-21 DIAGNOSIS — R55 Syncope and collapse: Secondary | ICD-10-CM

## 2011-08-21 DIAGNOSIS — E782 Mixed hyperlipidemia: Secondary | ICD-10-CM

## 2011-08-21 HISTORY — DX: Hypokalemia: E87.6

## 2011-08-21 LAB — VITAMIN B12: Vitamin B-12: 766 pg/mL (ref 211–911)

## 2011-08-21 LAB — COMPREHENSIVE METABOLIC PANEL
Albumin: 2.7 g/dL — ABNORMAL LOW (ref 3.5–5.2)
BUN: 5 mg/dL — ABNORMAL LOW (ref 6–23)
Calcium: 7.6 mg/dL — ABNORMAL LOW (ref 8.4–10.5)
Creatinine, Ser: 0.86 mg/dL (ref 0.50–1.35)
GFR calc Af Amer: >90 mL/min (ref >90)
Total Protein: 4.9 g/dL — ABNORMAL LOW (ref 6.0–8.3)

## 2011-08-21 LAB — CBC
HCT: 32.8 % — ABNORMAL LOW (ref 39.0–52.0)
MCH: 32.7 pg (ref 26.0–34.0)
MCHC: 32.9 g/dL (ref 30.0–36.0)
MCV: 99.4 fL (ref 78.0–100.0)
RDW: 14.3 % (ref 11.5–15.5)

## 2011-08-21 LAB — CARDIAC PANEL(CRET KIN+CKTOT+MB+TROPI): Total CK: 73 U/L (ref 7–232)

## 2011-08-21 MED ORDER — POTASSIUM CHLORIDE CRYS ER 20 MEQ PO TBCR
40.0000 meq | EXTENDED_RELEASE_TABLET | Freq: Three times a day (TID) | ORAL | Status: AC
  Administered 2011-08-21 (×3): 40 meq via ORAL
  Filled 2011-08-21 (×3): qty 2

## 2011-08-21 MED ORDER — ALBUTEROL SULFATE (5 MG/ML) 0.5% IN NEBU: 2.5000 mg | INHALATION_SOLUTION | Freq: Two times a day (BID) | RESPIRATORY_TRACT | Status: DC

## 2011-08-21 MED ORDER — POTASSIUM CHLORIDE 20 MEQ/15ML (10%) PO LIQD
40.0000 meq | Freq: Every day | ORAL | Status: DC
  Administered 2011-08-21: 40 meq via ORAL
  Filled 2011-08-21: qty 30

## 2011-08-21 MED ORDER — SODIUM CHLORIDE 0.9 % IV BOLUS (SEPSIS)
500.0000 mL | Freq: Once | INTRAVENOUS | Status: AC
  Administered 2011-08-21: 500 mL via INTRAVENOUS

## 2011-08-21 NOTE — Discharge Summary (Signed)
Physician Discharge Summary  Curtis Gentry ZOX:[id number] DOB: 08-14-1945 DOA: 08/20/2011  PCP: Hoyle Sauer, MD  Admit date: 08/20/2011 Discharge date: 08/21/2011  Discharge Diagnoses:  Principal Problem:  *Syncope and collapse Active Problems:  Hyperlipidemia   Discharge Condition: Stable at time of discharge  Disposition:   Diet: Regular diet  History of present illness:  66 year old male with no significant past medical history with ongoing tobacco abuse one pack a day that comes in for syncopal episode this afternoon. He relates he was mowing his lawn when this happened. He sat down and he light up a cigarette and then lost consciousness. His Levoxine. He was unresponsive. For about 30-50 seconds. He relates no prodromal symptoms no palpitations , chest pain shortness of breath, notes recent diarrhea, no fever or melanotic stools. There is no seizure activity no loss of control of sphincters.   Hospital Course:  Syncope and collapse: He was admitted to the telemetry unit and monitored overnight orthostatics were checked in the emergency room which were positive he was given a liter bolus and his orthostasis does not improve. An EKG was done that showed normal sinus rhythm, in telemetry he was monitored overnight and he remained in normal sinus rhythm with a couple PVCs. Cardiac enzymes were checked which were negative x2, his creatinine dropped from 1.1-0.8. Which assures that he was significantly intravascularly depleted. I vitamin B12 was checked which was 766 TSH was within normal limits. After being overnight at 150 cc of normal saline and a positive urine output orthostatics were checked which were negative. He was informed of the results and was discharged in stable condition.  Hypokalemia: After IV hydration overnight his potassium dropped to 3.1 it was repleted her magnesium was checked and it was within normal. This probably secondary to decreased intravascular  volume. Discharge Exam: Filed Vitals:   08/21/11 1400  BP: 117/71  Pulse: 55  Temp: 97.9 F (36.6 C)  Resp:    Filed Vitals:   08/21/11 1205 08/21/11 1206 08/21/11 1209 08/21/11 1400  BP: 105/55 94/60 96/63  117/71  Pulse: 79 71 90 55  Temp:    97.9 F (36.6 C)  TempSrc:    Oral  Resp:      Height:      Weight:      SpO2:    94%   General: Alert and oriented x3 in no acute distress Cardiovascular: Regular rate and rhythm with positive S1-S2 no murmurs rubs gallops Respiratory: Good air movement clear to auscultation.  Discharge Instructions  Discharge Orders    Future Orders Please Complete By Expires   Diet - low sodium heart healthy      Increase activity slowly        Medication List  As of 08/21/2011  4:07 PM   TAKE these medications         atorvastatin 10 MG tablet   Commonly known as: LIPITOR   Take 10 mg by mouth daily.      diazepam 5 MG tablet   Commonly known as: VALIUM   Take 5 mg by mouth every 6 (six) hours as needed. For nerves.      HYDROcodone-acetaminophen 5-500 MG per tablet   Commonly known as: VICODIN   Take 1 tablet by mouth 4 (four) times daily as needed. For pain.              The results of significant diagnostics from this hospitalization (including imaging, microbiology, ancillary and laboratory) are listed below for reference.  Significant Diagnostic Studies: Dg Chest 2 View  08/20/2011  *RADIOLOGY REPORT*  Clinical Data: Loss of consciousness.  Smoking history.  CHEST - 2 VIEW  Comparison: 01/14/2010  Findings: The lungs are hyperinflated with scarring consistent with diffuse emphysema.  No evidence of infiltrate, collapse or effusion.  Heart size is normal.  Mediastinal shadows are unremarkable.  No significant bony finding.  IMPRESSION: Severe diffuse emphysema.  No acute process evident.  Original Report Authenticated By: Thomasenia Sales, M.D.    Microbiology: No results found for this or any previous visit (from the past  240 hour(s)).   Labs: Basic Metabolic Panel:  Lab 08/21/11 6962 08/20/11 1928 08/20/11 1435  NA 142 -- 139  K 3.0* -- 3.7  CL 112 -- 106  CO2 21 -- 23  GLUCOSE 111* -- 101*  BUN 5* -- 7  CREATININE 0.86 0.90 1.03  CALCIUM 7.6* -- 8.5  MG -- 1.8 --  PHOS -- -- --   Liver Function Tests:  Lab 08/21/11 0253  AST 12  ALT 8  ALKPHOS 81  BILITOT 0.3  PROT 4.9*  ALBUMIN 2.7*   No results found for this basename: LIPASE:5,AMYLASE:5 in the last 168 hours No results found for this basename: AMMONIA:5 in the last 168 hours CBC:  Lab 08/21/11 0253 08/20/11 1928 08/20/11 1435  WBC 5.7 7.0 8.0  NEUTROABS -- -- --  HGB 10.8* 12.9* 11.4*  HCT 32.8* 38.2* 33.0*  MCV 99.4 100.5* 97.3  PLT 192 218 189   Cardiac Enzymes:  Lab 08/21/11 0253 08/20/11 1928  CKTOTAL 73 80  CKMB 3.6 3.9  CKMBINDEX -- --  TROPONINI <0.30 <0.30   BNP: No components found with this basename: POCBNP:5 CBG: No results found for this basename: GLUCAP:5 in the last 168 hours  Time coordinating discharge: Greater than 30 minutes  Signed:  Marinda Elk  [facility] 08/21/2011, 4:07 PM

## 2011-08-21 NOTE — Progress Notes (Signed)
UR Completed [place] Nonaka Graves-Bigelow, RN,BSN [phone]  

## 2011-08-21 NOTE — Care Management Note (Signed)
    Page 1 of 1   08/21/2011     11:36:11 AM   CARE MANAGEMENT NOTE 08/21/2011  Patient:  Curtis Gentry, Curtis Gentry   Account Number:  [id number]  Date Initiated:  08/21/2011  Documentation initiated by:  GRAVES-BIGELOW,Warren Lindahl  Subjective/Objective Assessment:   Pt admitted with syncope. Pt was mowing lawn. + for orthostatic hypotension.     Action/Plan:   CM will continue to monitor for disposition needs if any.   Anticipated DC Date:  08/23/2011   Anticipated DC Plan:  HOME/SELF CARE      DC Planning Services  CM consult      Choice offered to / List presented to:             Status of service:  Completed, signed off Medicare Important Message given?   (If response is "NO", the following Medicare IM given date fields will be blank) Date Medicare IM given:   Date Additional Medicare IM given:    Discharge Disposition:  HOME/SELF CARE  Per UR Regulation:  Reviewed for med. necessity/level of care/duration of stay  If discussed at Long Length of Stay Meetings, dates discussed:    Comments:

## 2011-08-22 LAB — FOLATE RBC: RBC Folate: 287 ng/mL — ABNORMAL LOW (ref >=366)

## 2012-03-16 ENCOUNTER — Encounter: Payer: Self-pay | Admitting: Internal Medicine

## 2012-04-16 ENCOUNTER — Encounter: Payer: Self-pay | Admitting: Internal Medicine

## 2012-04-16 ENCOUNTER — Ambulatory Visit (INDEPENDENT_AMBULATORY_CARE_PROVIDER_SITE_OTHER): Payer: Medicare Other | Admitting: Internal Medicine

## 2012-04-16 VITALS — BP 110/70 | HR 68 | Ht 66.0 in | Wt 125.6 lb

## 2012-04-16 DIAGNOSIS — Z1211 Encounter for screening for malignant neoplasm of colon: Secondary | ICD-10-CM

## 2012-04-16 DIAGNOSIS — K222 Esophageal obstruction: Secondary | ICD-10-CM

## 2012-04-16 DIAGNOSIS — R1310 Dysphagia, unspecified: Secondary | ICD-10-CM

## 2012-04-16 MED ORDER — MOVIPREP 100 G PO SOLR: 1.0000 | Freq: Once | ORAL | Status: DC

## 2012-04-16 NOTE — Patient Instructions (Addendum)
You have been given a separate informational sheet regarding your tobacco use, the importance of quitting and local resources to help you quit.  You have been scheduled for an endoscopy and colonoscopy with propofol. Please follow the written instructions given to you at your visit today. Please pick up your prep at the pharmacy within the next 1-3 days. If you use inhalers (even only as needed) or a CPAP machine, please bring them with you on the day of your procedure.

## 2012-04-16 NOTE — Progress Notes (Signed)
HISTORY OF PRESENT ILLNESS:  Curtis Gentry is a 67 y.o. 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 He presents today regarding dysphagia. The patient was evaluated in the office 09/06/2008 for dysphagia. That same month, he underwent upper endoscopy with Savary dilation of the esophagus to 17 mm. He has not been seen since. He states that the dilation may have helped his swallowing. He now reports intermittent solid food dysphagia item such as steak. He is chronically edentulous and needs to gum his food. He has had no weight loss. Actually gained weight since his last visit. He has not had screening colonoscopy. We have discussed this. Review of outside laboratories from January 2014 reveal normal CBC and comprehensive metabolic panel. Normal TSH and PSA. Negative urinalysis. Hemoccult negative stool.  REVIEW OF SYSTEMS:  All non-GI ROS negative except for back pain, ankle edema  Past Medical History  Diagnosis Date  . Perforated ulcer   . Hyperlipidemia   . Anxiety   . Unspecified disease of spinal cord     myelopahty, cervical spine  . Peptic ulcer disease   . Arthritis   . Erectile dysfunction   . Anemia     Past Surgical History  Procedure Laterality Date  . 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 Hemorrhoid surgery    . Partial gastrectomy      Social History Curtis Gentry  reports that he has been smoking.  He uses smokeless tobacco. He reports that he does not drink alcohol or use illicit drugs.  family history includes CAD in his father and unspecified family member; COPD in his father; Diabetes in an unspecified family member; Heart attack in his father; and Stroke in his mother.  No Known Allergies     PHYSICAL EXAMINATION: Vital signs: BP 110/70   Pulse 68  Ht 5\' 6"  (1.676 m)  Wt 125 lb 9.6 oz (56.972 kg)  BMI 20.28 kg/m2  Constitutional: Thin, generally well-appearing, no acute distress Psychiatric: alert and oriented x3, cooperative Eyes: extraocular movements intact, anicteric, conjunctiva pink Mouth: oral pharynx moist, no lesions. Edentulous Neck: supple no lymphadenopathy Cardiovascular: heart regular rate and rhythm, no murmur Lungs: clear to auscultation bilaterally Abdomen: soft, nontender, nondistended, no obvious ascites, no peritoneal signs, normal bowel sounds, no organomegaly Rectal: Deferred until colonoscopy Extremities: no lower extremity edema bilaterally Skin: no lesions on visible extremities Neuro: No focal deficits.   ASSESSMENT:  #1. Recurrent dysphagia. Rule out peptic stricture. #2. Status post Billroth I gastrectomy #3. Screening colonoscopy. Baseline risk #4. General medical problems. Stable   PLAN:  #1. Upper endoscopy with possible esophageal dilation.The nature of the procedure, as well as the risks, benefits, and alternatives were carefully and thoroughly reviewed with the patient. Ample time for discussion and questions allowed. The patient understood, was satisfied, and agreed to proceed. #2. Screening colonoscopy.The nature of the procedure, as well as the risks, benefits, and alternatives were carefully and thoroughly reviewed with the patient. Ample time for discussion and questions allowed. The patient understood, was satisfied, and agreed to proceed. #3. Movi prep prescribed. The patient instructed on its use.

## 2012-04-26 ENCOUNTER — Encounter: Payer: Self-pay | Admitting: Internal Medicine

## 2012-04-29 ENCOUNTER — Encounter: Payer: Medicare Other | Admitting: Internal Medicine

## 2012-05-03 ENCOUNTER — Encounter: Payer: Medicare Other | Admitting: Internal Medicine

## 2012-05-26 ENCOUNTER — Ambulatory Visit (AMBULATORY_SURGERY_CENTER): Payer: Medicare Other | Admitting: Internal Medicine

## 2012-05-26 ENCOUNTER — Encounter: Payer: Self-pay | Admitting: Internal Medicine

## 2012-05-26 VITALS — BP 112/63 | HR 63 | Temp 97.4°F | Resp 18 | Ht 66.0 in | Wt 125.0 lb

## 2012-05-26 DIAGNOSIS — Z1211 Encounter for screening for malignant neoplasm of colon: Secondary | ICD-10-CM

## 2012-05-26 DIAGNOSIS — R1310 Dysphagia, unspecified: Secondary | ICD-10-CM

## 2012-05-26 DIAGNOSIS — K222 Esophageal obstruction: Secondary | ICD-10-CM

## 2012-05-26 DIAGNOSIS — D126 Benign neoplasm of colon, unspecified: Secondary | ICD-10-CM

## 2012-05-26 MED ORDER — SODIUM CHLORIDE 0.9 % IV SOLN: 500.0000 mL | INTRAVENOUS | Status: DC

## 2012-05-26 NOTE — Patient Instructions (Addendum)
YOU HAD AN ENDOSCOPIC PROCEDURE TODAY AT THE [facility]: Refer to the procedure report that was given to you for any specific questions about what was found during the examination.  If the procedure report does not answer your questions, please call your gastroenterologist to clarify.  If you requested that your care partner not be given the details of your procedure findings, then the procedure report has been included in a sealed envelope for you to review at your convenience later.  YOU SHOULD EXPECT: Some feelings of bloating in the abdomen. Passage of more gas than usual.  Walking can help get rid of the air that was put into your GI tract during the procedure and reduce the bloating. If you had a lower endoscopy (such as a colonoscopy or flexible sigmoidoscopy) you may notice spotting of blood in your stool or on the toilet paper. If you underwent a bowel prep for your procedure, then you may not have a normal bowel movement for a few days.  DIET: See dilation diet- Drink plenty of fluids but you should avoid alcoholic beverages for 24 hours.  ACTIVITY: Your care partner should take you home directly after the procedure.  You should plan to take it easy, moving slowly for the rest of the day.  You can resume normal activity the day after the procedure however you should NOT DRIVE or use heavy machinery for 24 hours (because of the sedation medicines used during the test).    SYMPTOMS TO REPORT IMMEDIATELY: A gastroenterologist can be reached at any hour.  During normal business hours, 8:30 AM to 5:00 PM Monday through Friday, call [phone].  After hours and on weekends, please call the GI answering service at [phone] who will take a message and have the physician on call contact you.   Following lower endoscopy (colonoscopy or flexible sigmoidoscopy):  Excessive amounts of blood in the stool  Significant tenderness or worsening of abdominal pains  Swelling of the  abdomen that is new, acute  Fever of 100F or higher  Following upper endoscopy (EGD)  Vomiting of blood or coffee ground material  New chest pain or pain under the shoulder blades  Painful or persistently difficult swallowing  New shortness of breath  Fever of 100F or higher  Black, tarry-looking stools  FOLLOW UP: If any biopsies were taken you will be contacted by phone or by letter within the next 1-3 weeks.  Call your gastroenterologist if you have not heard about the biopsies in 3 weeks.  Our staff will call the home number listed on your records the next business day following your procedure to check on you and address any questions or concerns that you may have at that time regarding the information given to you following your procedure. This is a courtesy call and so if there is no answer at the home number and we have not heard from you through the emergency physician on call, we will assume that you have returned to your regular daily activities without incident.  SIGNATURES/CONFIDENTIALITY: You and/or your care partner have signed paperwork which will be entered into your electronic medical record.  These signatures attest to the fact that that the information above on your After Visit Summary has been reviewed and is understood.  Full responsibility of the confidentiality of this discharge information lies with you and/or your care-partner.  Polyps, diverticulosis, and high fiber diet-handouts given  Repeat colonoscopy in 6 months depending on pathology  Dilation Diet-handout given  Stricture-handout given

## 2012-05-26 NOTE — Progress Notes (Signed)
Did not schedule colonoscopy at this time, schedule not out that far, advised pt to call in closer to time to schedule

## 2012-05-26 NOTE — Progress Notes (Signed)
Pt was drowsy so long after procedure, I kept him on my monitors for 10 extra mins to make sure he was good and awake before he left

## 2012-05-26 NOTE — Op Note (Signed)
[facility] [address] [place], [address]   ENDOSCOPY PROCEDURE REPORT  PATIENT: Curtis, Gentry  MR#: [id number] BIRTHDATE: 03-14-1945 , 67  yrs. old GENDER: Male ENDOSCOPIST: Roxy Cedar, MD REFERRED BY:  Chilton Greathouse, M.D. PROCEDURE DATE:  05/26/2012 PROCEDURE:  EGD, diagnostic and Maloney dilation of esophagus  - 89 F ASA CLASS:     Class II INDICATIONS:  Dysphagia. MEDICATIONS: MAC sedation, administered by CRNA and propofol (Diprivan) 250mg  IV TOPICAL ANESTHETIC: none  DESCRIPTION OF PROCEDURE: After the risks benefits and alternatives of the procedure were thoroughly explained, informed consent was obtained.  The LB GIF-H180 [id number] endoscope was introduced through the mouth and advanced to the second portion of the duodenum. Without limitations.  The instrument was slowly withdrawn as the mucosa was fully examined.      The esophagus revealed mild stricture at the gastroesophageal junction.  Mild inflammation as well.  Otherwise normal.  The stomach revealed evidence of Billroth I gastrectomy was otherwise normal. Retroflex view revealed a small hiatal hernia.  The duodenum was normal.  THERAPY: The 67 French Maloney dilator was passed into the esophagus without resistance or heme.  Tolerated well.  a.     The scope was then withdrawn from the patient and the procedure completed.  COMPLICATIONS: There were no complications. ENDOSCOPIC IMPRESSION: 1. 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 Status post Billroth I gastrectomy.  RECOMMENDATIONS: Clear liquids until  , then soft foods rest iof day.  Resume prior diet tomorrow.  REPEAT EXAM:  eSigned:  Roxy Cedar, MD 05/26/2012 3:03 PM   XB:[id number] Avva, MD and The Patient  PATIENT NAME:  Curtis, Gentry MR#: [id number]

## 2012-05-26 NOTE — Progress Notes (Signed)
Patient did not experience any of the following events: a burn prior to discharge; a fall within the facility; wrong site/side/patient/procedure/implant event; or a hospital transfer or hospital admission upon discharge from the facility. (G8907) Patient did not have preoperative order for IV antibiotic SSI prophylaxis. (G8918)  

## 2012-05-26 NOTE — Op Note (Signed)
[facility] [address] [place], [address]   COLONOSCOPY PROCEDURE REPORT  PATIENT: Sathvik, Tiedt  MR#: [id number] BIRTHDATE: 1945/11/09 , 67  yrs. old GENDER: Male ENDOSCOPIST: Roxy Cedar, MD REFERRED QM:[id number] Avva, M.D. PROCEDURE DATE:  05/26/2012 PROCEDURE:   Colonoscopy with snare polypectomy x 17 and Submucosal injection, [place] ink tattoo x 2 ASA CLASS:   Class II INDICATIONS:average risk screening. MEDICATIONS: MAC sedation, administered by CRNA and propofol (Diprivan) 500mg  IV  DESCRIPTION OF PROCEDURE:   After the risks benefits and alternatives of the procedure were thoroughly explained, informed consent was obtained.  A digital rectal exam revealed no abnormalities of the rectum.   The LB CF-H180AL [id number]  endoscope was introduced through the anus and advanced to the cecum, which was identified by both the appendix and ileocecal valve. No adverse events experienced.   The quality of the prep was excellent, using MoviPrep  The instrument was then slowly withdrawn as the colon was fully examined.      COLON FINDINGS: There was severe diverticulosis throughout the entire colon with associated sigmoid stenosis.  Multiple moderate and large-size polyps were identified throughout the colon.  They were located in the cecum, descending colon (5), transverse colon (8), descending( 3) and sigmoid colon.  2 polyps in the ascending colon were 15 mm and sessile.  These were removed with hot snare. In addition, 2 polyps in the distal transverse and or proximal descending colon (labeled descending) measured 25 mm (piecemeal, snare cautery) and 20 mm (hot snare).  The remainder of the polyps measured between 5 and 9 mm and were removed with cold snare.  All polyps were retrieved.  The 2 largest polyps were marked with tattoo Ink for future identification.  Retroflexed views revealed no abnormalities. The time to cecum=8 minutes 0  seconds. Withdrawal time=37 minutes 0 seconds.  The scope was withdrawn and the procedure completed.  COMPLICATIONS: There were no complications.  ENDOSCOPIC IMPRESSION: 1. Multiple polyps throughout the colon removed with cold snare 2. Sessile proximal ascending polyps removed with snare cautery 3. LargeProximal descending (possible distal transverse) polyps removed with snare cautery piecemeal and snare cautery whole . [place] ink tattoos placed around these lesions for future identification  RECOMMENDATIONS: 1.  Await pathology results 2.  Repeat Colonoscopy in 6 months if no cancer found. 3. EGD today. See results.   eSigned:  Roxy Cedar, MD 05/26/2012 2:55 PM   cc: Chilton Greathouse, MD and The Patient   PATIENT NAME:  Oniel, Meleski MR#: [id number]

## 2012-05-26 NOTE — Progress Notes (Signed)
Called to room to assist during endoscopic procedure.  Patient ID and intended procedure confirmed with present staff. Received instructions for my participation in the procedure from the performing physician.  

## 2012-05-27 ENCOUNTER — Telehealth: Payer: Self-pay | Admitting: *Deleted

## 2012-05-27 NOTE — Telephone Encounter (Signed)
  Follow up Call-  Call back number 05/26/2012  Post procedure Call Back phone  # [phone]  Permission to leave phone message Yes     Patient questions:  Do you have a fever, pain , or abdominal swelling? no Pain Score  0 *  Have you tolerated food without any problems? yes  Have you been able to return to your normal activities? yes  Do you have any questions about your discharge instructions: Diet   no Medications  no Follow up visit  no  Do you have questions or concerns about your Care? no  Actions: * If pain score is 4 or above: No action needed, pain <4.

## 2012-06-01 ENCOUNTER — Encounter: Payer: Self-pay | Admitting: Internal Medicine

## 2012-06-15 ENCOUNTER — Telehealth: Payer: Self-pay | Admitting: Internal Medicine

## 2012-06-15 NOTE — Telephone Encounter (Signed)
Dr. Vicente Males Office requested 04/16/2012 Consult Note on 06/07/2012  Faxed 8 pages Office Note from 04/16/2012, Colon and Endo Reports from 05/26/2012 on 06/08/2012  asw

## 2012-10-02 ENCOUNTER — Encounter: Payer: Self-pay | Admitting: Internal Medicine

## 2013-05-16 ENCOUNTER — Encounter: Payer: Self-pay | Admitting: Internal Medicine

## 2014-02-23 ENCOUNTER — Encounter (HOSPITAL_COMMUNITY): Payer: Self-pay | Admitting: Emergency Medicine

## 2014-02-23 ENCOUNTER — Emergency Department (HOSPITAL_COMMUNITY)
Admission: EM | Admit: 2014-02-23 | Discharge: 2014-02-23 | Disposition: A | Discharge status: Home / Self Care | Payer: Medicare HMO | Attending: Emergency Medicine | Admitting: Emergency Medicine

## 2014-02-23 DIAGNOSIS — M1990 Unspecified osteoarthritis, unspecified site: Secondary | ICD-10-CM | POA: Diagnosis not present

## 2014-02-23 DIAGNOSIS — Z87438 Personal history of other diseases of male genital organs: Secondary | ICD-10-CM | POA: Insufficient documentation

## 2014-02-23 DIAGNOSIS — R61 Generalized hyperhidrosis: Secondary | ICD-10-CM | POA: Diagnosis not present

## 2014-02-23 DIAGNOSIS — Z862 Personal history of diseases of the blood and blood-forming organs and certain disorders involving the immune mechanism: Secondary | ICD-10-CM | POA: Insufficient documentation

## 2014-02-23 DIAGNOSIS — E785 Hyperlipidemia, unspecified: Secondary | ICD-10-CM | POA: Diagnosis not present

## 2014-02-23 DIAGNOSIS — Z8719 Personal history of other diseases of the digestive system: Secondary | ICD-10-CM | POA: Insufficient documentation

## 2014-02-23 DIAGNOSIS — Z8669 Personal history of other diseases of the nervous system and sense organs: Secondary | ICD-10-CM | POA: Insufficient documentation

## 2014-02-23 DIAGNOSIS — R42 Dizziness and giddiness: Secondary | ICD-10-CM | POA: Diagnosis not present

## 2014-02-23 DIAGNOSIS — R55 Syncope and collapse: Secondary | ICD-10-CM | POA: Insufficient documentation

## 2014-02-23 DIAGNOSIS — Z79899 Other long term (current) drug therapy: Secondary | ICD-10-CM | POA: Diagnosis not present

## 2014-02-23 DIAGNOSIS — Z720 Tobacco use: Secondary | ICD-10-CM | POA: Diagnosis not present

## 2014-02-23 LAB — COMPREHENSIVE METABOLIC PANEL
ALK PHOS: 86 U/L (ref 39–117)
ALT: 19 U/L (ref 0–53)
ANION GAP: 7 (ref 5–15)
AST: 26 U/L (ref 0–37)
Albumin: 3.5 g/dL (ref 3.5–5.2)
BILIRUBIN TOTAL: 0.6 mg/dL (ref 0.3–1.2)
BUN: 10 mg/dL (ref 6–23)
CHLORIDE: 107 meq/L (ref 96–112)
CO2: 25 mmol/L (ref 19–32)
Calcium: 8.8 mg/dL (ref 8.4–10.5)
Creatinine, Ser: 1.04 mg/dL (ref 0.50–1.35)
GFR, EST AFRICAN AMERICAN: 83 mL/min — AB (ref >90)
GFR, EST NON AFRICAN AMERICAN: 72 mL/min — AB (ref >90)
GLUCOSE: 110 mg/dL — AB (ref 70–99)
POTASSIUM: 4.5 mmol/L (ref 3.5–5.1)
Sodium: 139 mmol/L (ref 135–145)
Total Protein: 5.9 g/dL — ABNORMAL LOW (ref 6.0–8.3)

## 2014-02-23 LAB — CBC WITH DIFFERENTIAL/PLATELET
Basophils Absolute: 0 10*3/uL (ref 0.0–0.1)
Basophils Relative: 0 % (ref 0–1)
Eosinophils Absolute: 0 10*3/uL (ref 0.0–0.7)
Eosinophils Relative: 0 % (ref 0–5)
HEMATOCRIT: 37.1 % — AB (ref 39.0–52.0)
HEMOGLOBIN: 12.5 g/dL — AB (ref 13.0–17.0)
LYMPHS ABS: 1.5 10*3/uL (ref 0.7–4.0)
Lymphocytes Relative: 14 % (ref 12–46)
MCH: 33.6 pg (ref 26.0–34.0)
MCHC: 33.7 g/dL (ref 30.0–36.0)
MCV: 99.7 fL (ref 78.0–100.0)
MONOS PCT: 4 % (ref 3–12)
Monocytes Absolute: 0.5 10*3/uL (ref 0.1–1.0)
NEUTROS ABS: 9 10*3/uL — AB (ref 1.7–7.7)
NEUTROS PCT: 82 % — AB (ref 43–77)
Platelets: 225 10*3/uL (ref 150–400)
RBC: 3.72 MIL/uL — AB (ref 4.22–5.81)
RDW: 13.8 % (ref 11.5–15.5)
WBC: 11.1 10*3/uL — ABNORMAL HIGH (ref 4.0–10.5)

## 2014-02-23 LAB — TROPONIN I: Troponin I: <0.03 ng/mL (ref <0.031)

## 2014-02-23 MED ORDER — SODIUM CHLORIDE 0.9 % IV BOLUS (SEPSIS)
1000.0000 mL | Freq: Once | INTRAVENOUS | Status: AC
  Administered 2014-02-23: 1000 mL via INTRAVENOUS

## 2014-02-23 NOTE — ED Notes (Signed)
pt reports standing in line at [employer], syncopal episode from standing postiion.  Pt was sat up and had another syncopal episode.  Systolic bp went from 90 to 70s upon sitting up.  Pt was diaphoretic, no pain, no dizziness, no n/v/d or bloody stools.  Pt hit head when he fell, c/o soreness.  No neck or back pain noted.

## 2014-02-23 NOTE — ED Provider Notes (Signed)
CSN: [id number]     Arrival date & time 02/23/14  1440 History   First MD Initiated Contact with Patient 02/23/14 1458     Chief Complaint  Patient presents with  . Loss of Consciousness      HPI Patient reports syncopal episode while standing in line for approximately 10 minutes at a store today.  He states he ate and drank normally yesterday.  He states he began feeling slightly lightheaded and diaphoretic and then ended up on the floor.  No preceding chest pain or shortness of breath.  No preceding palpitations.  He states he's had intermittent episodes like this before in the past.  He was admitted to the hospital in 2013 for syncope and it was determined at that time that he was orthostatic.  EMS reported vital signs were checked at the store and he was orthostatic on scene.  IV fluids infused on arrival to the emergency department.  At this time patient is without complaints.  He denies chest pain or shortness of breath.  He feels fine.  No tongue biting.  Denies significant head injury.  Denies headache.  Denies neck pain or hip pain..  Denies weakness in his arms or legs.  Denies abdominal pain.  Denies nausea vomiting diarrhea.  Denies bloody stools.   Past Medical History  Diagnosis Date  . Perforated ulcer   . Hyperlipidemia   . Anxiety   . Unspecified disease of spinal cord     myelopahty, cervical spine  . Peptic ulcer disease   . Arthritis   . Erectile dysfunction   . Anemia    Past Surgical History  Procedure Laterality Date  . 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 Hemorrhoid surgery    . Partial gastrectomy     Family History  Problem Relation Age of Onset  . Stroke Mother   . COPD Father     ?  Marland Kitchen Heart attack Father   . CAD Father   . Diabetes      family members  . CAD      family members   History  Substance Use Topics  . Smoking status: Current Every Day Smoker -- 1.00 packs/day for 50 years  . Smokeless tobacco: Current User   . Alcohol Use: No     Comment: 2-4 ounces alcohol per day    Review of Systems  All other systems reviewed and are negative.     Allergies  Review of patient's allergies indicates no known allergies.  Home Medications   Prior to Admission medications   Medication Sig Start Date End Date Taking? Authorizing Provider  atorvastatin (LIPITOR) 10 MG tablet Take 10 mg by mouth daily.   Yes Historical Provider, MD  diazepam (VALIUM) 5 MG tablet Take 5 mg by mouth every 6 (six) hours as needed. For nerves.   Yes Historical Provider, MD  gabapentin (NEURONTIN) 300 MG capsule Take 300 mg by mouth 3 (three) times daily.   Yes Historical Provider, MD  HYDROcodone-acetaminophen (NORCO/VICODIN) 5-325 MG per tablet Take 1-2 tablets by mouth every 6 (six) hours as needed for severe pain.  02/23/14  Yes Historical Provider, MD  HYDROcodone-acetaminophen (VICODIN) 5-500 MG per tablet Take 1 tablet by mouth 4 (four) times daily as needed. For pain.    Historical Provider, MD  sertraline (ZOLOFT) 25 MG tablet Take 25 mg by mouth at bedtime as needed.    Historical Provider, MD  BP 117/60 mmHg  Pulse 53  Temp(Src) 97.5 F (36.4 C) (Oral)  Resp 16  SpO2 100% Physical Exam  Constitutional: He is oriented to person, place, and time. He appears well-developed and well-nourished.  HENT:  Head: Normocephalic and atraumatic.  Eyes: EOM are normal.  Neck: Normal range of motion.  Cardiovascular: Normal rate, regular rhythm, normal heart sounds and intact distal pulses.   Pulmonary/Chest: Effort normal and breath sounds normal. No respiratory distress.  Abdominal: Soft. He exhibits no distension. There is no tenderness.  Musculoskeletal: Normal range of motion.  Neurological: He is alert and oriented to person, place, and time.  Skin: Skin is warm and dry.  Psychiatric: He has a normal mood and affect. Judgment normal.  Nursing note and vitals reviewed.   ED Course  Procedures (including critical  care time) Labs Review Labs Reviewed  CBC WITH DIFFERENTIAL - Abnormal; Notable for the following:    WBC 11.1 (*)    RBC 3.72 (*)    Hemoglobin 12.5 (*)    HCT 37.1 (*)    Neutrophils Relative % 82 (*)    Neutro Abs 9.0 (*)    All other components within normal limits  COMPREHENSIVE METABOLIC PANEL  TROPONIN I    Imaging Review No results found.   EKG Interpretation   Date/Time:  Thursday February 23 2014 14:44:23 EST Ventricular Rate:  55 PR Interval:  164 QRS Duration: 94 QT Interval:  481 QTC Calculation: 460 R Axis:   63 Text Interpretation:  Sinus rhythm No significant change was found  Confirmed by Tanairi Cypert  MD, Lennette Bihari ([id number]) on 02/23/2014 3:27:59 PM      MDM   Final diagnoses:  None    Patient is without complaints at this time.  He states he would like to go home.  He ambulated in the emergency department without any difficulty.  Normal sinus rhythm in the emergency department.  Plan will be to discharge home with primary care follow-up.  I will also refer the patient to outpatient cardiology for follow-up.  He may benefit from Holter monitoring.    Hoy Morn, MD 02/23/14 [phone]

## 2014-02-23 NOTE — ED Notes (Addendum)
Per EMS: pt reports standing in line at [employer], syncopal episode from standing postiion.  Pt was sat up and had another syncopal episode.  Systolic bp went from 90 to 70s upon sitting up.  Pt was diaphoretic, no pain, no dizziness, no n/v/d or bloody stools.  Pt hit head when he fell, c/o soreness.  No neck or back pain noted.

## 2014-02-27 ENCOUNTER — Other Ambulatory Visit: Payer: Self-pay | Admitting: *Deleted

## 2014-02-27 ENCOUNTER — Telehealth: Payer: Self-pay | Admitting: Cardiology

## 2014-02-27 NOTE — Telephone Encounter (Signed)
02-27-14 Per Curtis Gentry- after looking over notes from ed - she advised that patient need to see cardiology 1st. to decieded which monitor he would need. Patient is  Schedule to see Dr.Skains on 03-01-14. Patient is aware.  Will remove order for 72 hr holter -which we don't do.

## 2014-03-01 ENCOUNTER — Encounter: Payer: Self-pay | Admitting: Cardiology

## 2014-03-01 ENCOUNTER — Ambulatory Visit (INDEPENDENT_AMBULATORY_CARE_PROVIDER_SITE_OTHER): Payer: Medicare HMO | Admitting: Cardiology

## 2014-03-01 VITALS — BP 118/74 | HR 62 | Ht 66.0 in | Wt 122.0 lb

## 2014-03-01 DIAGNOSIS — R55 Syncope and collapse: Secondary | ICD-10-CM

## 2014-03-01 DIAGNOSIS — Z720 Tobacco use: Secondary | ICD-10-CM

## 2014-03-01 NOTE — Patient Instructions (Addendum)
Your physician recommends that you continue on your current medications as directed. Please refer to the Current Medication list given to you today.  Increase your salt intake. Try drinking sport drinks (Gatorade).   Your physician wants you to follow-up in: 1 year with Dr.Skians You will receive a reminder letter in the mail two months in advance. If you don't receive a letter, please call our office to schedule the follow-up appointment.

## 2014-03-01 NOTE — Progress Notes (Signed)
[facility]. [address], [address] [place], [place]  [address] Phone: [phone] Fax:  [phone]  Date:  03/01/2014   ID:  Curtis Gentry, Curtis Gentry 18-Nov-1945, MRN [id number]  PCP:  Tivis Ringer, MD   History of Present Illness: Curtis Gentry is a 69 y.o. male here for evaluation of syncope. He was in the emergency department on 02/23/14, reported syncopal episode while standing in line for approximate 10 minutes at a store, [place] paying for medication.  Getting money out.  He began to feel a prodrome of lightheadedness, diaphoresis, shirt wet then ended up on the floor. Had this happen at [facility] once before, 2 years ago.  No chest pain, no palpitations. He has had this previously. In 2013 he was admitted to the hospital with syncope and was determined at the time to have orthostasis. EMS reported that he was orthostatic on the scene. IV fluids were administered and he felt fine shortly thereafter.  Prior cardiac history, no prior stroke history. He is a smoker.  Had back surgery, after Dr. Arnoldo Morale. Trouble walking. No claudication, palpable distal pulses. Shakes in the evening sometimes. Stops when warm up. Daytime OK.    Wt Readings from Last 3 Encounters:  03/01/14 122 lb (55.339 kg)  05/26/12 125 lb (56.7 kg)  04/16/12 125 lb 9.6 oz (56.972 kg)     Past Medical History  Diagnosis Date  . Perforated ulcer   . Hyperlipidemia   . Anxiety   . Unspecified disease of spinal cord     myelopahty, cervical spine  . Peptic ulcer disease   . Arthritis   . Erectile dysfunction   . Anemia     Past Surgical History  Procedure Laterality Date  . 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 Hemorrhoid surgery    . Partial gastrectomy      Current Outpatient Prescriptions  Medication Sig Dispense Refill  . atorvastatin (LIPITOR) 10 MG tablet Take 10 mg by mouth daily.    . diazepam (VALIUM) 5 MG tablet Take 5 mg by mouth every 6 (six) hours as needed. For  nerves.    . gabapentin (NEURONTIN) 300 MG capsule Take 300 mg by mouth 3 (three) times daily.    Marland Kitchen HYDROcodone-acetaminophen (NORCO/VICODIN) 5-325 MG per tablet Take 1-2 tablets by mouth every 6 (six) hours as needed for severe pain.     . Multiple Vitamin (MULTIVITAMIN) capsule Take 1 capsule by mouth daily.    . sertraline (ZOLOFT) 25 MG tablet Take 25 mg by mouth at bedtime as needed.     No current facility-administered medications for this visit.    Allergies:   No Known Allergies  Social History:  The patient  reports that he has been smoking.  He uses smokeless tobacco. He reports that he does not drink alcohol or use illicit drugs.   Family History  Problem Relation Age of Onset  . Stroke Mother   . COPD Father     ?  Marland Kitchen Heart attack Father   . CAD Father   . Diabetes      family members  . CAD      family members    ROS:  Please see the history of present illness.   Chronic back pain, arm paresthesias, weakness, leg weakness following surgeries. No strokelike symptoms, no chest pain, no orthopnea, no rashes.   All other systems reviewed and negative.   PHYSICAL  EXAM: VS:  BP 118/74 mmHg  Pulse 62  Ht 5\' 6"  (1.676 m)  Wt 122 lb (55.339 kg)  BMI 19.70 kg/m2 Thin, in no acute distress HEENT: normal, [place]/AT, EOMI Neck: no JVD, normal carotid upstroke, no bruit Cardiac:  normal S1, S2; RRR; no murmur Lungs:  clear to auscultation bilaterally, no wheezing, rhonchi or rales Abd: soft, nontender, no hepatomegaly, no bruits Ext: no edema, 2+ distal pulses Skin: warm and dry GU: deferred Neuro: no focal abnormalities noted, AAO x 3  EKG:  02/23/17-sinus rhythm, 55, no other abnormalities, slightly prominent T waves precordial leads, normal intervals.  Echocardiogram 01/15/10-normal ejection fraction, no valvular abnormalities   Labs: Troponin normal, hemoglobin 12.5, potassium 4.5, creatinine 1.0, glucose 110, ALT 19  ASSESSMENT AND PLAN:  1. Syncope-likely  vasovagal syncope given his prodrome which is classic of lightheadedness, diaphoresis "drenching his shirt with sweat". He does not describe any tachycardia arrhythmias prior to episode. Previous echocardiogram reassuring. EKG reassuring. No signs of QT prolongation or other interval changes. For now, I would like to take a conservative approach and continue with aggressive hydration, salt liberalization, sports drink etc. We discussed at length. Also, upon the first onset of symptoms, he is to sit down/lay down. IV hydration seemed to alleviate his symptoms. Orthostatics playing a role in this as. Occasionally medications such as fludrocortisone or needed to bolster blood pressure but we will continue with salt/hydration. If symptoms return or become more worrisome, we will then proceed with event monitor. For now, seems like classic vasovagal syncope. He states that these episodes are rare, occurring about once every 2 years. No evidence of carotid bruit, palpable distal pulses. 2. Tobacco cessation discussed. 3. We will see him back in one year for follow-up.  Signed, Candee Furbish, MD [facility]  03/01/2014 8:34 AM

## 2014-05-11 ENCOUNTER — Ambulatory Visit (AMBULATORY_SURGERY_CENTER): Payer: Self-pay

## 2014-05-11 VITALS — Ht 68.0 in | Wt 120.4 lb

## 2014-05-11 DIAGNOSIS — Z860100 Personal history of colon polyps, unspecified: Secondary | ICD-10-CM

## 2014-05-11 MED ORDER — MOVIPREP 100 G PO SOLR: 1.0000 | Freq: Once | ORAL | Status: DC

## 2014-05-11 NOTE — Progress Notes (Signed)
No allergies to eggs or soy No diet/weight loss meds No home oxygen No past problems with anesthesia  No email 

## 2014-05-25 ENCOUNTER — Encounter: Payer: Medicare HMO | Admitting: Internal Medicine

## 2015-01-04 DIAGNOSIS — Z23 Encounter for immunization: Secondary | ICD-10-CM | POA: Diagnosis not present

## 2015-04-11 DIAGNOSIS — R7309 Other abnormal glucose: Secondary | ICD-10-CM | POA: Diagnosis not present

## 2015-04-11 DIAGNOSIS — Z125 Encounter for screening for malignant neoplasm of prostate: Secondary | ICD-10-CM | POA: Diagnosis not present

## 2015-04-11 DIAGNOSIS — E784 Other hyperlipidemia: Secondary | ICD-10-CM | POA: Diagnosis not present

## 2015-04-18 DIAGNOSIS — K635 Polyp of colon: Secondary | ICD-10-CM | POA: Diagnosis not present

## 2015-04-18 DIAGNOSIS — Z0000 Encounter for general adult medical examination without abnormal findings: Secondary | ICD-10-CM | POA: Diagnosis not present

## 2015-04-18 DIAGNOSIS — K279 Peptic ulcer, site unspecified, unspecified as acute or chronic, without hemorrhage or perforation: Secondary | ICD-10-CM | POA: Diagnosis not present

## 2015-04-18 DIAGNOSIS — L8991 Pressure ulcer of unspecified site, stage 1: Secondary | ICD-10-CM | POA: Diagnosis not present

## 2015-04-18 DIAGNOSIS — G6289 Other specified polyneuropathies: Secondary | ICD-10-CM | POA: Diagnosis not present

## 2015-04-18 DIAGNOSIS — H9193 Unspecified hearing loss, bilateral: Secondary | ICD-10-CM | POA: Diagnosis not present

## 2015-04-18 DIAGNOSIS — R7309 Other abnormal glucose: Secondary | ICD-10-CM | POA: Diagnosis not present

## 2015-04-18 DIAGNOSIS — E784 Other hyperlipidemia: Secondary | ICD-10-CM | POA: Diagnosis not present

## 2015-04-18 DIAGNOSIS — J449 Chronic obstructive pulmonary disease, unspecified: Secondary | ICD-10-CM | POA: Diagnosis not present

## 2015-04-18 DIAGNOSIS — I499 Cardiac arrhythmia, unspecified: Secondary | ICD-10-CM | POA: Diagnosis not present

## 2015-04-23 DIAGNOSIS — Z1212 Encounter for screening for malignant neoplasm of rectum: Secondary | ICD-10-CM | POA: Diagnosis not present

## 2015-05-01 ENCOUNTER — Encounter: Payer: Self-pay | Admitting: Neurology

## 2015-05-01 ENCOUNTER — Ambulatory Visit (INDEPENDENT_AMBULATORY_CARE_PROVIDER_SITE_OTHER): Payer: Medicare HMO | Admitting: Neurology

## 2015-05-01 ENCOUNTER — Telehealth: Payer: Self-pay | Admitting: *Deleted

## 2015-05-01 VITALS — BP 103/71 | HR 73 | Resp 20 | Ht 67.0 in | Wt 120.0 lb

## 2015-05-01 DIAGNOSIS — G609 Hereditary and idiopathic neuropathy, unspecified: Secondary | ICD-10-CM

## 2015-05-01 DIAGNOSIS — E538 Deficiency of other specified B group vitamins: Secondary | ICD-10-CM | POA: Diagnosis not present

## 2015-05-01 DIAGNOSIS — G629 Polyneuropathy, unspecified: Secondary | ICD-10-CM | POA: Diagnosis not present

## 2015-05-01 DIAGNOSIS — R269 Unspecified abnormalities of gait and mobility: Secondary | ICD-10-CM | POA: Diagnosis not present

## 2015-05-01 DIAGNOSIS — M4712 Other spondylosis with myelopathy, cervical region: Secondary | ICD-10-CM

## 2015-05-01 DIAGNOSIS — E531 Pyridoxine deficiency: Secondary | ICD-10-CM | POA: Diagnosis not present

## 2015-05-01 DIAGNOSIS — E519 Thiamine deficiency, unspecified: Secondary | ICD-10-CM | POA: Diagnosis not present

## 2015-05-01 NOTE — Progress Notes (Addendum)
[facility]    Provider:  Dr Jaynee Eagles Referring Provider: [name], MD Primary Care Physician:  Tivis Ringer, MD  CC:  Peripheral neuropathy  HPI:  Curtis Gentry is a 70 y.o. male here as a referral from Dr. Dagmar Hait for worsening upper and new lower peripheral neuropathy. PMHx anxiety, dysphagia, unsteady gait, peptic ulcer dz, cervical myelopathy, COPD, 3-level ACD with fusion and plating 2011, HLD, arrhythmia. Feet feel warm, started 3-4 months ago or longer. He feels it on the bottom of the feet of both. They get hot, burning. He had an operation  on his spine which he feels caused all his symptoms. He used have a lot of strength and now his arms feel numb. It can be 80 degress outside he will shiver like he is freezing to death. When he gets to the house and car he feels better. He describes the sensory changes as encompassing the whole arm to the shoudlers from the fingers to the neck. No alcohol in the last 5 years but he used to drink mixed drinks at night. He smokes a pack a day ever since he was 55. The arms symptoms have not worsened since the operation, stable since the operation and not worsening. He has a lot of weakness, he can't pick things up and his walking ability is bad since the operation. Smokes 1PPD. No headache, dysarthria or dysphagia, diplopia.   Reviewed notes, labs and imaging from outside physicians, which showed:  HgbA1c 5.8  CT HEAD WITHOUT CONTRAST 12/2009 (personally reviewed and agree with following)  Technique: Contiguous axial images were obtained from the base of the skull through the vertex without contrast.  Comparison: Head CT scan 02/12/2008.  Findings: There is some cortical atrophy. No evidence of acute abnormality including acute infarction, hemorrhage, mass lesion, mass effect, midline shift or abnormal extra-axial fluid collection. No hydrocephalus. The calvarium is intact.  IMPRESSION: No acute  finding.  Reviewed notes from [facility] 03/02/2008. Patient had a fall 01/2008 resulting in ambulation difficulties, tingling and dysesthesias in both arms and tingling sensations in both arms. CT of the neck showed significant cervical degenerative disease at c4-c5. Reported smoking 1/2 pack per day and 2-3 oz of liquor a night. Exam c/w weakness of the intrinsic muscles of the hands with distal atrophy mostly of the FDI. 4-/5 and triceps weakness of the left arm. There was sensory loss in the distal upper extremities with intact position sense and vibration. Gait is wide based, tandem unsteady, romberg negative.   MRI of the cervical spine 02/2008 showed cervical spondylosis from c3-c5, c5-c6 with severe cord compression at c4-c5 and c6-c7 with altered signal in these areas.   Review of Systems: Patient complains of symptoms per HPI as well as the following symptoms: chills, weight loss, easy bleeding, hearing loss, ringing in ears, joint pain, cramps, aching muscles, moles, urination problems, weakness, passing out. Pertinent negatives per HPI. All others negative.   Social History   Social History  . Marital Status: Divorced    Spouse Name: N/A  . Number of Children: 3  . Years of Education: N/A   Occupational History  . retired    Social History Main Topics  . Smoking status: Current Every Day Smoker -- 1.00 packs/day for 50 years  . Smokeless tobacco: Current User  . Alcohol Use: No     Comment: 2-4 ounces alcohol per day  . Drug Use: No  . Sexual Activity: No   Other Topics Concern  .  Not on file   Social History Narrative    Family History  Problem Relation Age of Onset  . Stroke Mother   . COPD Father     ?  Marland Kitchen Heart attack Father   . CAD Father   . Diabetes      family members  . CAD      family members  . Colon cancer Neg Hx   . Neuropathy Neg Hx     Past Medical History  Diagnosis Date  . Perforated ulcer ([place])   . Hyperlipidemia   . Anxiety   . Unspecified  disease of spinal cord     myelopahty, cervical spine  . Peptic ulcer disease   . Arthritis   . Erectile dysfunction   . Anemia     Past Surgical History  Procedure Laterality Date  . 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 Hemorrhoid surgery    . Partial gastrectomy      Current Outpatient Prescriptions  Medication Sig Dispense Refill  . atorvastatin (LIPITOR) 10 MG tablet Take 10 mg by mouth daily.    . diazepam (VALIUM) 5 MG tablet Take 5 mg by mouth every 6 (six) hours as needed. For nerves.    . gabapentin (NEURONTIN) 300 MG capsule Take 300 mg by mouth 3 (three) times daily.    Marland Kitchen HYDROcodone-acetaminophen (NORCO/VICODIN) 5-325 MG per tablet Take 1-2 tablets by mouth every 6 (six) hours as needed for severe pain.     Marland Kitchen MOVIPREP 100 G SOLR Take 1 kit (200 g total) by mouth once. 1 kit 0  . Multiple Vitamin (MULTIVITAMIN) capsule Take 1 capsule by mouth daily.    Marland Kitchen OVER THE COUNTER MEDICATION Fish Oil 2 tabs daily     No current facility-administered medications for this visit.    Allergies as of 05/01/2015  . (No Known Allergies)    Vitals: BP 103/71 mmHg  Pulse 73  Resp 20  Ht '5\' 7"'  (1.702 m)  Wt 120 lb (54.432 kg)  BMI 18.79 kg/m2 Last Weight:  Wt Readings from Last 1 Encounters:  05/01/15 120 lb (54.432 kg)   Last Height:   Ht Readings from Last 1 Encounters:  05/01/15 '5\' 7"'  (1.702 m)   Physical exam: Exam: Gen: NAD, conversant, poorly groomed and heavily smelling of smoke                CV: RRR, no MRG. No Carotid Bruits. No peripheral edema, warm, nontender Eyes: Conjunctivae clear without exudates or hemorrhage  Neuro: Detailed Neurologic Exam  Speech:    Speech is slightly dysarthric but he adentulous today; fluent and spontaneous with normal comprehension.  Cognition:    The patient is oriented to person, place, and time;     recent and remote memory intact;     language fluent;     normal attention, concentration,      fund of knowledge Cranial Nerves:    The pupils are equal, round, and reactive to light. Attempted fundoscopic exam could not visualize. . Visual fields are full to finger confrontation. Extraocular movements are intact. Trigeminal sensation is intact and the muscles of mastication are normal. The face is symmetric. The palate elevates in the midline. Hearing impaired. Voice is normal. Shoulder shrug is normal. The tongue has normal motion without fasciculations.   Coordination:    Mild dysmetria finger to nose and heel to shin. Impaired Fine motor skills.  Gait:  Myelopathic gait, impaired coordination and balance,   Motor Observation: Distal UE atrophy most pronounced in the FDI no involuntary movements noted. Tone:    Normal muscle tone.    Posture:    Posture is erect    Strength: Left triceps weakness 3+/5. Weakness in the distal upper extremities 3/5 most prounounced in the ulnar innervated muscles. otherwise strength is intact.      Sensation: Decreased pin prick and temp to the elbows. Intact distally in the legs. Intact proprioception and vibration in the toes. Romberg negative     Reflex Exam:  DTR's:    Deep tendon reflexes in the upper and lower extremities are hyperreflexic bilaterally.   Toes:    The toes are downgoing bilaterally.   Clonus:    3 beats bilat AJs.       Assessment/Plan:  70 year old male with PMHx of cervical myelopathy with resultant stable UE numbness and weakness, gait disorder and new onset burning in the feet likely peripheral neuropathy. His exam is significant for mostly distal UE weakness and significant atrophy of the intrinsic hand muscles, abnormal gait, distal small-fiber sensory loss in the upper extremities but intact in the lower extremities, hyperreflexia and clonus. MRI of the cervical spine 02/2008 showed cervical spondylosis from c3-c5, c5-c6 with severe cord compression at c4-c5 and c6-c7 with altered signal in these areas and he  underwent 3-level anterior cervical diskectomy, fusion and plating.  EMG/NCS: One leg and one arm  Upper extremity weakness and numbness: stable and likely due to his cervical spondylosis and myelopathy. Exam today is similar to exam performed at [facility] in 01/2008 and in 2010 post surgery. Will perform an emg/ncs and afterwards consider MRI of the cervical spine.   Abnormal gait: Likely also due to to his cervical spondylosis and myelopathy. He reports burning on the bottom of his feet but his sensory exam is not significantly impaired in the lower extremities and his Romberg is negative so his neuropathy may not be significantly contributing.  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 Notes from 2009 stated 2-3 alcoholic drinks an evening, past alcohol use may be contributory but unclear. Will perform an emg/ncs. Will perform a thorough serum neuropathy panel. (tsh, hiv,sed, pan-anca, rpr, hepc, rf, celiac, b6,b1, b12,folate, IFE,lyme,mma,cmp)   Sarina Ill, MD  [facility] [address] [place], [place] [address]  Phone [phone] Fax [phone]

## 2015-05-01 NOTE — Telephone Encounter (Signed)
I faxed a release requesting patient office notes and labs, imaging reports on 05/01/15.

## 2015-05-01 NOTE — Telephone Encounter (Signed)
[place]. [phone] ext [phone] called to advise there is no information available to send to our office, patient last seen in 2010, any information would be stored offsite.

## 2015-05-01 NOTE — Patient Instructions (Signed)
Remember to drink plenty of fluid, eat healthy meals and do not skip any meals. Try to eat protein with a every meal and eat a healthy snack such as fruit or nuts in between meals. Try to keep a regular sleep-wake schedule and try to exercise daily, particularly in the form of walking, 20-30 minutes a day, if you can.   As far as diagnostic testing: labs  I would like to see you back in for emg/ncs, sooner if we need to. Please call us with any interim questions, concerns, problems, updates or refill requests.   Our phone number is [phone]. We also have an after hours call service for urgent matters and there is a physician on-call for urgent questions. For any emergencies you know to call 911 or go to the nearest emergency room

## 2015-05-03 ENCOUNTER — Encounter: Payer: Self-pay | Admitting: Neurology

## 2015-05-03 DIAGNOSIS — G609 Hereditary and idiopathic neuropathy, unspecified: Secondary | ICD-10-CM

## 2015-05-03 DIAGNOSIS — R269 Unspecified abnormalities of gait and mobility: Secondary | ICD-10-CM | POA: Insufficient documentation

## 2015-05-03 DIAGNOSIS — M4712 Other spondylosis with myelopathy, cervical region: Secondary | ICD-10-CM

## 2015-05-03 HISTORY — DX: Other spondylosis with myelopathy, cervical region: M47.12

## 2015-05-03 HISTORY — DX: Unspecified abnormalities of gait and mobility: R26.9

## 2015-05-03 HISTORY — DX: Hereditary and idiopathic neuropathy, unspecified: G60.9

## 2015-05-03 LAB — COMPREHENSIVE METABOLIC PANEL
A/G RATIO: 1.6 (ref 1.1–2.5)
ALT: 20 IU/L (ref 0–44)
AST: 25 IU/L (ref 0–40)
Albumin: 4 g/dL (ref 3.5–4.8)
Alkaline Phosphatase: 104 IU/L (ref 39–117)
BUN/Creatinine Ratio: 11 (ref 10–22)
BUN: 10 mg/dL (ref 8–27)
Bilirubin Total: 0.4 mg/dL (ref 0.0–1.2)
CALCIUM: 9.5 mg/dL (ref 8.6–10.2)
CO2: 23 mmol/L (ref 18–29)
CREATININE: 0.93 mg/dL (ref 0.76–1.27)
Chloride: 103 mmol/L (ref 96–106)
GFR calc Af Amer: 96 mL/min/{1.73_m2} (ref >59)
GFR, EST NON AFRICAN AMERICAN: 83 mL/min/{1.73_m2} (ref >59)
GLOBULIN, TOTAL: 2.5 g/dL (ref 1.5–4.5)
Glucose: 93 mg/dL (ref 65–99)
Potassium: 4.4 mmol/L (ref 3.5–5.2)
Sodium: 145 mmol/L — ABNORMAL HIGH (ref 134–144)
TOTAL PROTEIN: 6.5 g/dL (ref 6.0–8.5)

## 2015-05-03 LAB — B. BURGDORFI ANTIBODIES

## 2015-05-03 LAB — MULTIPLE MYELOMA PANEL, SERUM
ALPHA 1: 0.1 g/dL (ref 0.0–0.4)
ALPHA2 GLOB SERPL ELPH-MCNC: 0.8 g/dL (ref 0.4–1.0)
Albumin SerPl Elph-Mcnc: 3.6 g/dL (ref 2.9–4.4)
Albumin/Glob SerPl: 1.3 (ref 0.7–1.7)
B-GLOBULIN SERPL ELPH-MCNC: 1 g/dL (ref 0.7–1.3)
GLOBULIN, TOTAL: 2.9 g/dL (ref 2.2–3.9)
Gamma Glob SerPl Elph-Mcnc: 1 g/dL (ref 0.4–1.8)
IgA/Immunoglobulin A, Serum: 341 mg/dL (ref 61–437)
IgG (Immunoglobin G), Serum: 953 mg/dL (ref 700–1600)
IgM (Immunoglobulin M), Srm: 84 mg/dL (ref 20–172)

## 2015-05-03 LAB — PAN-ANCA
ANCA Proteinase 3: <3.5 U/mL (ref 0.0–3.5)
Myeloperoxidase Ab: <9 U/mL (ref 0.0–9.0)

## 2015-05-03 LAB — VITAMIN B1: Thiamine: 187.3 nmol/L (ref 66.5–200.0)

## 2015-05-03 LAB — SEDIMENTATION RATE: Sed Rate: 11 mm/hr (ref 0–30)

## 2015-05-03 LAB — B12 AND FOLATE PANEL
Folate: >20 ng/mL (ref >3.0)
Vitamin B-12: 653 pg/mL (ref 211–946)

## 2015-05-03 LAB — METHYLMALONIC ACID, SERUM: METHYLMALONIC ACID: 253 nmol/L (ref 0–378)

## 2015-05-03 LAB — ANGIOTENSIN CONVERTING ENZYME: Angio Convert Enzyme: 89 U/L — ABNORMAL HIGH (ref 14–82)

## 2015-05-03 LAB — RHEUMATOID FACTOR: Rhuematoid fact SerPl-aCnc: <10 IU/mL (ref 0.0–13.9)

## 2015-05-03 LAB — GLIADIN ANTIBODIES, SERUM
Antigliadin Abs, IgA: 4 units (ref 0–19)
Gliadin IgG: 5 units (ref 0–19)

## 2015-05-03 LAB — HIV ANTIBODY (ROUTINE TESTING W REFLEX): HIV Screen 4th Generation wRfx: NONREACTIVE

## 2015-05-03 LAB — HEAVY METALS, BLOOD
Arsenic: 6 ug/L (ref 2–23)
Lead, Blood: 3 ug/dL (ref 0–19)
MERCURY: NOT DETECTED ug/L (ref 0.0–14.9)

## 2015-05-03 LAB — RPR: RPR: NONREACTIVE

## 2015-05-03 LAB — TSH: TSH: 2.66 u[IU]/mL (ref 0.450–4.500)

## 2015-05-03 LAB — TISSUE TRANSGLUTAMINASE, IGA: Transglutaminase IgA: <2 U/mL (ref 0–3)

## 2015-05-03 LAB — VITAMIN B6: VITAMIN B6: 16.5 ug/L (ref 5.3–46.7)

## 2015-05-03 LAB — HEPATITIS C ANTIBODY

## 2015-05-04 ENCOUNTER — Telehealth: Payer: Self-pay | Admitting: *Deleted

## 2015-05-04 NOTE — Telephone Encounter (Signed)
Called pt and relayed unremarkable labs per Dr Jaynee Eagles. Pt verbalized understanding.

## 2015-05-04 NOTE — Telephone Encounter (Signed)
-----   Message from Melvenia Beam, MD sent at 05/03/2015  6:17 PM EST ----- Labs unremarkable thanks

## 2015-05-07 ENCOUNTER — Telehealth: Payer: Self-pay | Admitting: *Deleted

## 2015-05-07 NOTE — Telephone Encounter (Signed)
Per Dr Arnoldo Morale office patient records are off site, it could take up to 3 weeks before we receive.

## 2015-05-10 ENCOUNTER — Ambulatory Visit (INDEPENDENT_AMBULATORY_CARE_PROVIDER_SITE_OTHER): Payer: Self-pay | Admitting: Neurology

## 2015-05-10 ENCOUNTER — Ambulatory Visit (INDEPENDENT_AMBULATORY_CARE_PROVIDER_SITE_OTHER): Payer: Medicare HMO | Admitting: Neurology

## 2015-05-10 DIAGNOSIS — Z0289 Encounter for other administrative examinations: Secondary | ICD-10-CM

## 2015-05-10 DIAGNOSIS — G629 Polyneuropathy, unspecified: Secondary | ICD-10-CM

## 2015-05-10 DIAGNOSIS — R531 Weakness: Secondary | ICD-10-CM

## 2015-05-14 NOTE — Progress Notes (Signed)
See procedure note.

## 2015-05-17 NOTE — Progress Notes (Signed)
  [facility]    Provider:  Dr Jaynee Eagles Referring Provider: [name], MD Primary Care Physician:  Tivis Ringer, MD  HPI:  Curtis Gentry is a 70 year old male with PMHx of cervical myelopathy with resultant stable UE numbness and weakness, gait disorder and new onset burning in the feet likely peripheral neuropathy. His exam is significant for mostly distal UE weakness and significant atrophy of the intrinsic hand muscles, abnormal gait, distal sensory loss in the upper extremities but intact sensory exam of the lower extremities, hyperreflexia and clonus. 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   Summary  Nerve conduction studies were performed on the left upper left right lower extremities:  Left APB Median motor nerve showed normal conductions with normal F Wave latency Left  ADM Ulnar motor nerve showed normal conductions with normal F Wave latency Left  Peroneal motor nerve showed normal conductions with normal F Wave latency Left  Tibial motor nerve showed normal conductions with normal F Wave latency Left  second-digit Median sensory nerve conduction was within normal limits Left  fifth-digit Ulnar sensory nerve conduction was within normal limits Left  Sural sensory nerve conduction was within normal limits Left  Superficial Peroneal sensory nerve conduction was within normal limits Left H Reflex showed normal latencies  EMG Needle study was performed on selected left upper and lower extremity muscles:   The left Deltoid and Triceps showed increased motor unit amplitude and reduced motor unit recruitment. The left Biceps was within normal limits. The left Pronator Teres showed reduced motor unit recruitment. The left First Dorsal Interosseous and left opponens  Pollicis showed giant motor unit amplitude, reduced motor unit recruitment and prolonged motor unit duration. The left  Vastus Medialis, Anterior Tibialis, Medial Gastrocnemius, Extensor Hallucis Longus and Abductor Hallucis muscles were within normal limits.  Conclusion: There is no electrophysiologic evidence for ongoing/acute cervical radiculopathy, lower extremity large fiber peripheral polyneuropathy. However a small fiber neuropathy could be responsible for patient's distal foot paresthesias and still evade detection by this study.   Sarina Ill, MD  [facility] [address] [place], [place] [address]  Phone [phone] Fax [phone]

## 2015-05-17 NOTE — Procedures (Signed)
[facility]    Provider:  Dr Jaynee Eagles Referring Provider: [name], MD Primary Care Physician:  Tivis Ringer, MD  HPI:  Curtis Gentry is a 70 year old male with PMHx of cervical myelopathy with resultant stable UE numbness and weakness, gait disorder and new onset burning in the feet likely peripheral neuropathy. His exam is significant for mostly distal UE weakness and significant atrophy of the intrinsic hand muscles, abnormal gait, distal sensory loss in the upper extremities but intact sensory exam of the lower extremities, hyperreflexia and clonus. 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   Summary  Nerve conduction studies were performed on the left upper left right lower extremities:  Left APB Median motor nerve showed normal conductions with normal F Wave latency Left  ADM Ulnar motor nerve showed normal conductions with normal F Wave latency Left  Peroneal motor nerve showed normal conductions with normal F Wave latency Left  Tibial motor nerve showed normal conductions with normal F Wave latency Left  second-digit Median sensory nerve conduction was within normal limits Left  fifth-digit Ulnar sensory nerve conduction was within normal limits Left  Sural sensory nerve conduction was within normal limits Left  Superficial Peroneal sensory nerve conduction was within normal limits Left H Reflex showed normal latencies  EMG Needle study was performed on selected left upper and lower extremity muscles:   The left Deltoid and Triceps showed increased motor unit amplitude and reduced motor unit recruitment. The left Biceps was within normal limits. The left Pronator Teres showed reduced motor unit recruitment. The left First Dorsal Interosseous and left opponens  Pollicis showed giant motor unit amplitude, reduced motor unit recruitment and prolonged motor unit duration. The left  Vastus Medialis, Anterior Tibialis, Medial Gastrocnemius, Extensor Hallucis Longus and Abductor Hallucis muscles were within normal limits.  Conclusion: There is no electrophysiologic evidence for ongoing/acute cervical radiculopathy or lower extremity large fiber peripheral polyneuropathy. However a small fiber neuropathy could be responsible for patient's distal foot paresthesias and still evade detection by this study.   Sarina Ill, MD  [facility] [address] [place], [place] [address]  Phone [phone] Fax [phone]

## 2015-08-30 DIAGNOSIS — R69 Illness, unspecified: Secondary | ICD-10-CM | POA: Diagnosis not present

## 2015-08-30 DIAGNOSIS — Z0000 Encounter for general adult medical examination without abnormal findings: Secondary | ICD-10-CM | POA: Diagnosis not present

## 2015-08-30 DIAGNOSIS — E785 Hyperlipidemia, unspecified: Secondary | ICD-10-CM | POA: Diagnosis not present

## 2015-08-31 DIAGNOSIS — R69 Illness, unspecified: Secondary | ICD-10-CM | POA: Diagnosis not present

## 2015-08-31 DIAGNOSIS — G6289 Other specified polyneuropathies: Secondary | ICD-10-CM | POA: Diagnosis not present

## 2015-08-31 DIAGNOSIS — G959 Disease of spinal cord, unspecified: Secondary | ICD-10-CM | POA: Diagnosis not present

## 2015-08-31 DIAGNOSIS — E538 Deficiency of other specified B group vitamins: Secondary | ICD-10-CM | POA: Diagnosis not present

## 2015-08-31 DIAGNOSIS — J449 Chronic obstructive pulmonary disease, unspecified: Secondary | ICD-10-CM | POA: Diagnosis not present

## 2015-08-31 DIAGNOSIS — G629 Polyneuropathy, unspecified: Secondary | ICD-10-CM | POA: Diagnosis not present

## 2015-08-31 DIAGNOSIS — R739 Hyperglycemia, unspecified: Secondary | ICD-10-CM | POA: Diagnosis not present

## 2016-01-03 DIAGNOSIS — J449 Chronic obstructive pulmonary disease, unspecified: Secondary | ICD-10-CM | POA: Diagnosis not present

## 2016-01-03 DIAGNOSIS — G9589 Other specified diseases of spinal cord: Secondary | ICD-10-CM | POA: Diagnosis not present

## 2016-01-03 DIAGNOSIS — R69 Illness, unspecified: Secondary | ICD-10-CM | POA: Diagnosis not present

## 2016-01-03 DIAGNOSIS — Z681 Body mass index (BMI) 19 or less, adult: Secondary | ICD-10-CM | POA: Diagnosis not present

## 2016-01-03 DIAGNOSIS — R7309 Other abnormal glucose: Secondary | ICD-10-CM | POA: Diagnosis not present

## 2016-01-03 DIAGNOSIS — K279 Peptic ulcer, site unspecified, unspecified as acute or chronic, without hemorrhage or perforation: Secondary | ICD-10-CM | POA: Diagnosis not present

## 2016-01-03 DIAGNOSIS — Z23 Encounter for immunization: Secondary | ICD-10-CM | POA: Diagnosis not present

## 2016-01-03 DIAGNOSIS — G6289 Other specified polyneuropathies: Secondary | ICD-10-CM | POA: Diagnosis not present

## 2016-03-06 DIAGNOSIS — R55 Syncope and collapse: Secondary | ICD-10-CM | POA: Diagnosis not present

## 2016-03-06 DIAGNOSIS — J449 Chronic obstructive pulmonary disease, unspecified: Secondary | ICD-10-CM | POA: Diagnosis not present

## 2016-03-06 DIAGNOSIS — I959 Hypotension, unspecified: Secondary | ICD-10-CM | POA: Diagnosis not present

## 2016-03-06 DIAGNOSIS — J209 Acute bronchitis, unspecified: Secondary | ICD-10-CM | POA: Diagnosis not present

## 2016-03-06 DIAGNOSIS — G629 Polyneuropathy, unspecified: Secondary | ICD-10-CM | POA: Diagnosis not present

## 2016-03-25 DIAGNOSIS — M159 Polyosteoarthritis, unspecified: Secondary | ICD-10-CM | POA: Diagnosis not present

## 2016-03-25 DIAGNOSIS — G8929 Other chronic pain: Secondary | ICD-10-CM | POA: Diagnosis not present

## 2016-03-25 DIAGNOSIS — Z681 Body mass index (BMI) 19 or less, adult: Secondary | ICD-10-CM | POA: Diagnosis not present

## 2016-03-25 DIAGNOSIS — E785 Hyperlipidemia, unspecified: Secondary | ICD-10-CM | POA: Diagnosis not present

## 2016-03-25 DIAGNOSIS — G629 Polyneuropathy, unspecified: Secondary | ICD-10-CM | POA: Diagnosis not present

## 2016-03-25 DIAGNOSIS — R69 Illness, unspecified: Secondary | ICD-10-CM | POA: Diagnosis not present

## 2016-03-25 DIAGNOSIS — Z974 Presence of external hearing-aid: Secondary | ICD-10-CM | POA: Diagnosis not present

## 2016-03-25 DIAGNOSIS — Z0000 Encounter for general adult medical examination without abnormal findings: Secondary | ICD-10-CM | POA: Diagnosis not present

## 2016-03-25 DIAGNOSIS — Z79891 Long term (current) use of opiate analgesic: Secondary | ICD-10-CM | POA: Diagnosis not present

## 2016-03-25 DIAGNOSIS — H9113 Presbycusis, bilateral: Secondary | ICD-10-CM | POA: Diagnosis not present

## 2016-03-25 DIAGNOSIS — M545 Low back pain: Secondary | ICD-10-CM | POA: Diagnosis not present

## 2016-04-14 DIAGNOSIS — R739 Hyperglycemia, unspecified: Secondary | ICD-10-CM | POA: Diagnosis not present

## 2016-04-14 DIAGNOSIS — E784 Other hyperlipidemia: Secondary | ICD-10-CM | POA: Diagnosis not present

## 2016-04-14 DIAGNOSIS — Z125 Encounter for screening for malignant neoplasm of prostate: Secondary | ICD-10-CM | POA: Diagnosis not present

## 2016-04-21 DIAGNOSIS — J449 Chronic obstructive pulmonary disease, unspecified: Secondary | ICD-10-CM | POA: Diagnosis not present

## 2016-04-21 DIAGNOSIS — M1990 Unspecified osteoarthritis, unspecified site: Secondary | ICD-10-CM | POA: Diagnosis not present

## 2016-04-21 DIAGNOSIS — Z681 Body mass index (BMI) 19 or less, adult: Secondary | ICD-10-CM | POA: Diagnosis not present

## 2016-04-21 DIAGNOSIS — R69 Illness, unspecified: Secondary | ICD-10-CM | POA: Diagnosis not present

## 2016-04-21 DIAGNOSIS — E784 Other hyperlipidemia: Secondary | ICD-10-CM | POA: Diagnosis not present

## 2016-04-21 DIAGNOSIS — Z0000 Encounter for general adult medical examination without abnormal findings: Secondary | ICD-10-CM | POA: Diagnosis not present

## 2016-04-21 DIAGNOSIS — K279 Peptic ulcer, site unspecified, unspecified as acute or chronic, without hemorrhage or perforation: Secondary | ICD-10-CM | POA: Diagnosis not present

## 2016-04-21 DIAGNOSIS — K635 Polyp of colon: Secondary | ICD-10-CM | POA: Diagnosis not present

## 2016-04-21 DIAGNOSIS — R3911 Hesitancy of micturition: Secondary | ICD-10-CM | POA: Diagnosis not present

## 2016-05-12 DIAGNOSIS — Z1212 Encounter for screening for malignant neoplasm of rectum: Secondary | ICD-10-CM | POA: Diagnosis not present

## 2016-07-08 ENCOUNTER — Emergency Department (HOSPITAL_COMMUNITY)
Admission: EM | Admit: 2016-07-08 | Discharge: 2016-07-08 | Disposition: A | Discharge status: Home / Self Care | Payer: Medicare HMO | Attending: Emergency Medicine | Admitting: Emergency Medicine

## 2016-07-08 ENCOUNTER — Encounter (HOSPITAL_COMMUNITY): Payer: Self-pay | Admitting: *Deleted

## 2016-07-08 ENCOUNTER — Other Ambulatory Visit: Payer: Self-pay

## 2016-07-08 DIAGNOSIS — F17200 Nicotine dependence, unspecified, uncomplicated: Secondary | ICD-10-CM | POA: Diagnosis not present

## 2016-07-08 DIAGNOSIS — R55 Syncope and collapse: Secondary | ICD-10-CM | POA: Diagnosis not present

## 2016-07-08 DIAGNOSIS — Z79899 Other long term (current) drug therapy: Secondary | ICD-10-CM | POA: Diagnosis not present

## 2016-07-08 DIAGNOSIS — R404 Transient alteration of awareness: Secondary | ICD-10-CM | POA: Diagnosis not present

## 2016-07-08 DIAGNOSIS — R69 Illness, unspecified: Secondary | ICD-10-CM | POA: Diagnosis not present

## 2016-07-08 LAB — CBC WITH DIFFERENTIAL/PLATELET
Basophils Absolute: 0 10*3/uL (ref 0.0–0.1)
Basophils Relative: 0 %
Eosinophils Absolute: 0.1 10*3/uL (ref 0.0–0.7)
Eosinophils Relative: 1 %
HCT: 37.2 % — ABNORMAL LOW (ref 39.0–52.0)
Hemoglobin: 12.7 g/dL — ABNORMAL LOW (ref 13.0–17.0)
Lymphocytes Relative: 15 %
Lymphs Abs: 1.4 10*3/uL (ref 0.7–4.0)
MCH: 35 pg — ABNORMAL HIGH (ref 26.0–34.0)
MCHC: 34.1 g/dL (ref 30.0–36.0)
MCV: 102.5 fL — ABNORMAL HIGH (ref 78.0–100.0)
Monocytes Absolute: 0.4 10*3/uL (ref 0.1–1.0)
Monocytes Relative: 4 %
Neutro Abs: 7.4 10*3/uL (ref 1.7–7.7)
Neutrophils Relative %: 80 %
Platelets: 205 10*3/uL (ref 150–400)
RBC: 3.63 MIL/uL — ABNORMAL LOW (ref 4.22–5.81)
RDW: 14.2 % (ref 11.5–15.5)
WBC: 9.3 10*3/uL (ref 4.0–10.5)

## 2016-07-08 LAB — BASIC METABOLIC PANEL
Anion gap: 7 (ref 5–15)
BUN: <5 mg/dL — ABNORMAL LOW (ref 6–20)
CO2: 25 mmol/L (ref 22–32)
Calcium: 8.6 mg/dL — ABNORMAL LOW (ref 8.9–10.3)
Chloride: 109 mmol/L (ref 101–111)
Creatinine, Ser: 0.8 mg/dL (ref 0.61–1.24)
GFR calc Af Amer: >60 mL/min (ref >60)
GFR calc non Af Amer: >60 mL/min (ref >60)
Glucose, Bld: 101 mg/dL — ABNORMAL HIGH (ref 65–99)
Potassium: 4 mmol/L (ref 3.5–5.1)
Sodium: 141 mmol/L (ref 135–145)

## 2016-07-08 LAB — URINALYSIS, ROUTINE W REFLEX MICROSCOPIC
Bilirubin Urine: NEGATIVE
Glucose, UA: 50 mg/dL — AB
Hgb urine dipstick: NEGATIVE
Ketones, ur: NEGATIVE mg/dL
Leukocytes, UA: NEGATIVE
Nitrite: NEGATIVE
Protein, ur: NEGATIVE mg/dL
Specific Gravity, Urine: 1.004 — ABNORMAL LOW (ref 1.005–1.030)
pH: 6 (ref 5.0–8.0)

## 2016-07-08 LAB — TROPONIN I

## 2016-07-08 MED ORDER — SODIUM CHLORIDE 0.9 % IV BOLUS (SEPSIS)
1000.0000 mL | Freq: Once | INTRAVENOUS | Status: AC
  Administered 2016-07-08: 1000 mL via INTRAVENOUS

## 2016-07-08 NOTE — ED Notes (Signed)
Got patient undress on the monitor did ekg shown to Dr Canary Brim patient is resting

## 2016-07-08 NOTE — Discharge Instructions (Addendum)
Drink plenty of fluids. Please follow-up with family doctor closely for further evaluation. Return if worsening symptoms.

## 2016-07-08 NOTE — ED Notes (Signed)
Pt ambulated the hall with a steady gait, No sign of dizziness, or headache

## 2016-07-08 NOTE — ED Provider Notes (Signed)
Patient signed out to me at shift change. Patient with syncopal episode while standing up in line. He reports multiple prior similar episodes. He is currently asymptomatic. No chest pain or shortness of breath. Denies headache. No injuries during the fall. He does report drinking lots of sodas during the day, admits to not may be drinking enough water. Initially orthostatic by EMS, improving with IV fluids. Labs pending. EKG with no acute findings.   7:20 PM Labs unremarkable. Patient states he feels well and denies any complaints. He would like to be discharged home. He was ambulatory and in department with no difficulty. His family is at bedside and agreeable to take him home. He will follow up with family doctor closely for further evaluation and syncope workup. Patient does not want to be admitted to the hospital. He again redirected it to me that he has had "tons of fainting spells in the past." VS normal. Pt in NAD. Stable for dc home.   Vitals:   07/08/16 1800 07/08/16 1815 07/08/16 1830 07/08/16 1900  BP: 125/76 135/73 124/76 138/70  Pulse: 67 70 70 64  Resp: '18 18 15 16  '$ Temp:      TempSrc:      SpO2: 99% 100% 98% 99%      Jeannett Senior, PA-C 07/08/16 Thelma Barge, MD 07/09/16 [phone]

## 2016-07-08 NOTE — ED Provider Notes (Addendum)
[facility] DEPT Provider Note   CSN: [id number] Arrival date & time: 07/08/16  1351     History   Chief Complaint Chief Complaint  Patient presents with  . Loss of Consciousness    HPI Curtis Gentry is a 71 y.o. male with history of hyperlipidemia, peptic ulcer disease who presents following syncopal episode. Patient was at the pharmacy at [facility] sitting in a chair when he began feeling lightheaded and passed out. Patient was unconscious for approximately a minute per bystanders. Patient noted he had associated nausea around the time of passing out, however this is resolved now. Patient did not fall or hit his head. Per EMS, patient was noted to have orthostatic hypotension changes upon standing from 104/70 to 80/40. Patient states he is feeling well now. Patient denies any chest pain, shortness of breath, palpitations, abdominal pain, vomiting, urinary symptoms, headache, dizziness. Patient states he has an episode of syncope around once a year, However patient's family states that he has had simple episodes about once a month for the past year. Patient states he ate and drank normally today. Patient has seen by cardiology on 03/01/2014 about prior episodes of syncope that were that should be due to vasovagal syncope and was treated with aggressive hydration, salt increase, sports drinks.  HPI  Past Medical History:  Diagnosis Date  . Anemia   . Anxiety   . Arthritis   . Erectile dysfunction   . Hyperlipidemia   . Peptic ulcer disease   . Perforated ulcer ([place])   . Unspecified disease of spinal cord    myelopahty, cervical spine    Patient Active Problem List   Diagnosis Date Noted  . 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 Abnormality of gait 05/03/2015  . Hereditary and idiopathic peripheral neuropathy 05/03/2015  . Syncope and collapse 08/20/2011  . ANEMIA-UNSPECIFIED 09/06/2008  . DYSPHAGIA 09/06/2008    Past Surgical History:  Procedure Laterality  Date  . BACK SURGERY  2011   3 level ACD with fusion and plating  . HEMORRHOID SURGERY    . PARTIAL GASTRECTOMY         Home Medications    Prior to Admission medications   Medication Sig Start Date End Date Taking? Authorizing Provider  atorvastatin (LIPITOR) 10 MG tablet Take 10 mg by mouth daily.   Yes [provider]  gabapentin (NEURONTIN) 300 MG capsule Take 300 mg by mouth 3 (three) times daily.   Yes [provider]  HYDROcodone-acetaminophen (NORCO/VICODIN) 5-325 MG per tablet Take 1-2 tablets by mouth every 6 (six) hours as needed for severe pain.  02/23/14  Yes [provider]  Multiple Vitamin (MULTIVITAMIN WITH MINERALS) TABS tablet Take 1 tablet by mouth daily.   Yes [provider]  Omega-3 Fatty Acids (FISH OIL PO) Take 1 capsule by mouth 2 (two) times daily.   Yes [provider]  pantoprazole (PROTONIX) 40 MG tablet Take 40 mg by mouth daily. 05/22/16  Yes [provider]  tamsulosin (FLOMAX) 0.4 MG CAPS capsule Take 0.4 mg by mouth daily. 06/25/16  Yes [provider]  MOVIPREP 100 G SOLR Take 1 kit (200 g total) by mouth once. Patient not taking: Reported on 07/08/2016 05/11/14   Irene Shipper, MD    Family History Family History  Problem Relation Age of Onset  . Stroke Mother   . COPD Father        ?  Marland Kitchen Heart attack Father   . CAD Father   .  Diabetes Unknown        family members  . CAD Unknown        family members  . Colon cancer Neg Hx   . Neuropathy Neg Hx     Social History Social History  Substance Use Topics  . Smoking status: Current Every Day Smoker    Packs/day: 1.00    Years: 50.00  . Smokeless tobacco: Current User  . Alcohol use No     Comment: 2-4 ounces alcohol per day     Allergies   Patient has no known allergies.   Review of Systems Review of Systems  Constitutional: Negative for chills and fever.  HENT: Negative for facial swelling and sore throat.     Respiratory: Negative for shortness of breath.   Cardiovascular: Negative for chest pain and palpitations.  Gastrointestinal: Negative for abdominal pain, nausea and vomiting.  Genitourinary: Negative for dysuria.  Musculoskeletal: Negative for back pain.  Skin: Negative for rash and wound.  Neurological: Positive for syncope. Negative for dizziness and headaches.  Psychiatric/Behavioral: The patient is not nervous/anxious.      Physical Exam Updated Vital Signs BP 131/66   Pulse 92   Temp 97.8 F (36.6 C) (Oral)   Resp 18   SpO2 100%   Physical Exam  Constitutional: He appears well-developed and well-nourished. No distress.  HENT:  Head: Normocephalic and atraumatic.  Mouth/Throat: Oropharynx is clear and moist. No oropharyngeal exudate.  Eyes: Conjunctivae are normal. Pupils are equal, round, and reactive to light. Right eye exhibits no discharge. Left eye exhibits no discharge. No scleral icterus.  Neck: Normal range of motion. Neck supple. No thyromegaly present.  Cardiovascular: Normal rate, regular rhythm, normal heart sounds and intact distal pulses.  Exam reveals no gallop and no friction rub.   No murmur heard. Pulmonary/Chest: Effort normal and breath sounds normal. No stridor. No respiratory distress. He has no wheezes. He has no rales.  Abdominal: Soft. Bowel sounds are normal. He exhibits no distension. There is no tenderness. There is no rebound and no guarding.  Musculoskeletal: He exhibits no edema.  Lymphadenopathy:    He has no cervical adenopathy.  Neurological: He is alert. Coordination normal.  Skin: Skin is warm and dry. No rash noted. He is not diaphoretic. No pallor.  Psychiatric: He has a normal mood and affect.  Nursing note and vitals reviewed.    ED Treatments / Results  Labs (all labs ordered are listed, but only abnormal results are displayed) Labs Reviewed  CBC WITH DIFFERENTIAL/PLATELET - Abnormal; Notable for the following:       Result  Value   RBC 3.63 (*)    Hemoglobin 12.7 (*)    HCT 37.2 (*)    MCV 102.5 (*)    MCH 35.0 (*)    All other components within normal limits  BASIC METABOLIC PANEL  URINALYSIS, ROUTINE W REFLEX MICROSCOPIC  TROPONIN I    EKG  EKG Interpretation None       Radiology No results found.  Procedures Procedures (including critical care time)  Medications Ordered in ED Medications  sodium chloride 0.9 % bolus 1,000 mL (1,000 mLs Intravenous New Bag/Given 07/08/16 1449)     Initial Impression / Assessment and Plan / ED Course  I have reviewed the triage vital signs and the nursing notes.  Pertinent labs & imaging results that were available during my care of the patient were reviewed by me and considered in my medical decision making (see chart for  details).     Patient with frequent syncopal episodes. Fluids given in ED. Patient mildly orthostatic. Labs pending. At shift change, patient care transferred to [facility], PA-C for continued evaluation, follow up of labs and determination of disposition.    Final Clinical Impressions(s) / ED Diagnoses   Final diagnoses:  Syncope, unspecified syncope type    New Prescriptions New Prescriptions   No medications on file     Caryl Ada 07/08/16 1658    Alfonzo Beers, MD 07/09/16 0812    Frederica Kuster, PA-C 08/25/16 1007    Alfonzo Beers, MD 08/25/16 1028

## 2016-07-08 NOTE — ED Provider Notes (Signed)
ED ECG REPORT    Date: 07/08/2016  Rate: 51  Rhythm: sinus bradycardia  QRS Axis: normal  Intervals: normal  ST/T Wave abnormalities: normal  Conduction Disutrbances: none  Narrative Interpretation: unremarkable Link not available in epic for interpretation into Curtis Loges, MD 07/08/16 [phone]

## 2016-07-08 NOTE — ED Triage Notes (Signed)
Pt arrives from [facility] via [facility]. Pt states he was getting dizzy and he sat down. Upon standing pt had a LOC and was unconscious for approx. 1 min per bystanders. EMS reports he had a significant orthostatic changes upon standing from 104/70 to 80/40 and pulses went from bounding to being unable to palpate per EMS.

## 2016-07-09 DIAGNOSIS — R2689 Other abnormalities of gait and mobility: Secondary | ICD-10-CM | POA: Diagnosis not present

## 2016-07-09 DIAGNOSIS — R69 Illness, unspecified: Secondary | ICD-10-CM | POA: Diagnosis not present

## 2016-07-09 DIAGNOSIS — J449 Chronic obstructive pulmonary disease, unspecified: Secondary | ICD-10-CM | POA: Diagnosis not present

## 2016-07-09 DIAGNOSIS — R7309 Other abnormal glucose: Secondary | ICD-10-CM | POA: Diagnosis not present

## 2016-07-09 DIAGNOSIS — R55 Syncope and collapse: Secondary | ICD-10-CM | POA: Diagnosis not present

## 2016-07-09 DIAGNOSIS — Z681 Body mass index (BMI) 19 or less, adult: Secondary | ICD-10-CM | POA: Diagnosis not present

## 2016-07-28 ENCOUNTER — Encounter: Payer: Self-pay | Admitting: Cardiology

## 2016-07-28 ENCOUNTER — Ambulatory Visit (INDEPENDENT_AMBULATORY_CARE_PROVIDER_SITE_OTHER): Payer: Medicare HMO | Admitting: Cardiology

## 2016-07-28 VITALS — BP 130/76 | HR 68 | Ht 68.0 in | Wt 118.6 lb

## 2016-07-28 DIAGNOSIS — R55 Syncope and collapse: Secondary | ICD-10-CM | POA: Diagnosis not present

## 2016-07-28 NOTE — Patient Instructions (Signed)
Medication Instructions:  The current medical regimen is effective;  continue present plan and medications.  Testing/Procedures: Your physician has requested that you have an echocardiogram. Echocardiography is a painless test that uses sound waves to create images of your heart. It provides your doctor with information about the size and shape of your heart and how well your heart's chambers and valves are working. This procedure takes approximately one hour. There are no restrictions for this procedure.  Your physician has recommended that you wear an event monitor. Event monitors are medical devices that record the heart's electrical activity. Doctors most often [place] these monitors to diagnose arrhythmias. Arrhythmias are problems with the speed or rhythm of the heartbeat. The monitor is a small, portable device. You can wear one while you do your normal daily activities. This is usually used to diagnose what is causing palpitations/syncope (passing out).  Follow-Up: Follow up in 6 months with Dr. Marlou Porch.  You will receive a letter in the mail 2 months before you are due.  Please call us when you receive this letter to schedule your follow up appointment.  If you need a refill on your cardiac medications before your next appointment, please call your pharmacy.  Thank you for choosing [facility]!!

## 2016-07-28 NOTE — Progress Notes (Signed)
[facility]. [address] [place], [place]  [address] Phone: [phone] Fax:  [phone]  Date:  07/28/2016   ID:  Curtis, Gentry 26-Jul-1945, MRN [id number]  PCP:  [name], MD   History of Present Illness: Curtis Gentry is a 71 y.o. male here for follow up of syncope.   Prior note:  ---He was in the emergency department on 02/23/14, reported syncopal episode while standing in line for approximate 10 minutes at a store, [place] paying for medication.  Getting money out.  He began to feel a prodrome of lightheadedness, diaphoresis, shirt wet then ended up on the floor. Had this happen at [facility] once before, 2 years ago.  No chest pain, no palpitations. He has had this previously. In 2013 he was admitted to the hospital with syncope and was determined at the time to have orthostasis. EMS reported that he was orthostatic on the scene. IV fluids were administered and he felt fine shortly thereafter.  Prior cardiac history, no prior stroke history. He is a smoker.  Had back surgery, after Dr. Arnoldo Morale. Trouble walking. No claudication, palpable distal pulses. Shakes in the evening sometimes. Stops when warm up. Daytime OK.   07/28/16-had another syncopal episode. Actually 2. One at the bank in line, one at [place]. Vasovagal like. During previous evaluation, orthostatics were positive. Denies any palpitations, chest pain.  Wt Readings from Last 3 Encounters:  07/28/16 118 lb 9.6 oz (53.8 kg)  05/01/15 120 lb (54.4 kg)  05/11/14 120 lb 6.4 oz (54.6 kg)     Past Medical History:  Diagnosis Date  . Anemia   . Anxiety   . Arthritis   . Erectile dysfunction   . Hyperlipidemia   . Peptic ulcer disease   . Perforated ulcer ([place])   . Unspecified disease of spinal cord    myelopahty, cervical spine    Past Surgical History:  Procedure Laterality Date  . BACK SURGERY  2011   3 level ACD with fusion and plating  . HEMORRHOID SURGERY    . PARTIAL GASTRECTOMY       Current Outpatient Prescriptions  Medication Sig Dispense Refill  . atorvastatin (LIPITOR) 10 MG tablet Take 10 mg by mouth daily.    . diazepam (VALIUM) 5 MG tablet Take 5 mg by mouth every 8 (eight) hours.    Marland Kitchen escitalopram (LEXAPRO) 5 MG tablet Take 5 mg by mouth daily.    Marland Kitchen gabapentin (NEURONTIN) 300 MG capsule Take 300 mg by mouth 3 (three) times daily.    Marland Kitchen HYDROcodone-acetaminophen (NORCO/VICODIN) 5-325 MG per tablet Take 1-2 tablets by mouth every 6 (six) hours as needed for severe pain.     . Multiple Vitamin (MULTIVITAMIN WITH MINERALS) TABS tablet Take 1 tablet by mouth daily.    . Omega-3 Fatty Acids (FISH OIL PO) Take 1 capsule by mouth 2 (two) times daily.    . tamsulosin (FLOMAX) 0.4 MG CAPS capsule Take 0.4 mg by mouth daily.     No current facility-administered medications for this visit.     Allergies:   No Known Allergies  Social History:  The patient  reports that he has been smoking.  He has a 50.00 pack-year smoking history. He uses smokeless tobacco. He reports that he does not drink alcohol or use drugs.   Family History  Problem Relation Age of Onset  . Stroke Mother   . COPD Father        ?  Marland Kitchen  Heart attack Father   . CAD Father   . Diabetes Unknown        family members  . CAD Unknown        family members  . Colon cancer Neg Hx   . Neuropathy Neg Hx     ROS:  Please see the history of present illness.   Has chronic back pain, leg weakness, occasional right leg tremor. Good distal pulses.  All other systems reviewed and negative.   PHYSICAL EXAM: VS:  BP 130/76   Pulse 68   Ht 5\' 8"  (1.727 m)   Wt 118 lb 9.6 oz (53.8 kg)   BMI 18.03 kg/m  GEN: Well nourished, well developed, in no acute distress  HEENT: normal  Neck: no JVD, carotid bruits, or masses Cardiac: RRR; no murmurs, rubs, or gallops,no edema  Respiratory:  clear to auscultation bilaterally, normal work of breathing GI: soft, nontender, nondistended, + BS MS: no deformity or  atrophy  Skin: warm and dry, no rash Neuro:  Alert and Oriented x 3, Strength and sensation are intact Psych: euthymic mood, full affect   EKG:  02/23/17-sinus rhythm, 55, no other abnormalities, slightly prominent T waves precordial leads, normal intervals.  Echocardiogram 01/15/10-normal ejection fraction, no valvular abnormalities   Labs: Troponin normal, hemoglobin 12.5, potassium 4.5, creatinine 1.0, glucose 110, ALT 19  ASSESSMENT AND PLAN:  Syncope  - Has happened again, prompting ER evaluation. [employer]. Prodrome, vasovagal like. Diaphoretic. Sweaty. Does not recall any tachycardia arrhythmias beforehand. Looking back in his records, he was admitted back in 2013 with similar symptoms. Telemetry at that time showed no adverse arrhythmias. I agree with Dr. Dagmar Hait, I will check an echocardiogram and event monitor now.  - Continue to aggressively hydrate. He did have a water bottle with him. Suggest Gatorade, salt liberalization. One could consider Florinef in the future. During prior hospitalization in 2013 his blood pressure did drop into the 90s over 60s.  Tobacco cessation discussed.  6 month follow up  Signed, Candee Furbish, MD [facility]  07/28/2016 5:02 PM

## 2016-07-31 DIAGNOSIS — M1990 Unspecified osteoarthritis, unspecified site: Secondary | ICD-10-CM | POA: Diagnosis not present

## 2016-07-31 DIAGNOSIS — R251 Tremor, unspecified: Secondary | ICD-10-CM | POA: Diagnosis not present

## 2016-07-31 DIAGNOSIS — R55 Syncope and collapse: Secondary | ICD-10-CM | POA: Diagnosis not present

## 2016-07-31 DIAGNOSIS — Z1389 Encounter for screening for other disorder: Secondary | ICD-10-CM | POA: Diagnosis not present

## 2016-07-31 DIAGNOSIS — J449 Chronic obstructive pulmonary disease, unspecified: Secondary | ICD-10-CM | POA: Diagnosis not present

## 2016-07-31 DIAGNOSIS — R69 Illness, unspecified: Secondary | ICD-10-CM | POA: Diagnosis not present

## 2016-07-31 DIAGNOSIS — Z681 Body mass index (BMI) 19 or less, adult: Secondary | ICD-10-CM | POA: Diagnosis not present

## 2016-08-12 ENCOUNTER — Ambulatory Visit (HOSPITAL_COMMUNITY): Payer: Medicare HMO | Attending: Cardiovascular Disease

## 2016-08-12 ENCOUNTER — Other Ambulatory Visit: Payer: Self-pay

## 2016-08-12 ENCOUNTER — Ambulatory Visit (INDEPENDENT_AMBULATORY_CARE_PROVIDER_SITE_OTHER): Payer: Medicare HMO

## 2016-08-12 DIAGNOSIS — R55 Syncope and collapse: Secondary | ICD-10-CM | POA: Diagnosis not present

## 2016-08-13 DIAGNOSIS — R55 Syncope and collapse: Secondary | ICD-10-CM | POA: Diagnosis not present

## 2016-08-15 ENCOUNTER — Telehealth: Payer: Self-pay | Admitting: Cardiology

## 2016-08-15 NOTE — Telephone Encounter (Signed)
Returned call-spoke to daughter (ok per DPR)-aware of results and verbalized understanding.

## 2016-08-15 NOTE — Telephone Encounter (Signed)
°  New Message   pt wife gladys verbalized that she is returning call for rn

## 2016-10-03 ENCOUNTER — Other Ambulatory Visit: Payer: Self-pay

## 2016-10-03 ENCOUNTER — Observation Stay (HOSPITAL_COMMUNITY)
Admission: EM | Admit: 2016-10-03 | Discharge: 2016-10-04 | Disposition: A | Discharge status: Home / Self Care | Payer: Medicare HMO | Attending: Family Medicine | Admitting: Family Medicine

## 2016-10-03 ENCOUNTER — Other Ambulatory Visit (HOSPITAL_COMMUNITY): Payer: Self-pay

## 2016-10-03 ENCOUNTER — Encounter (HOSPITAL_COMMUNITY): Payer: Self-pay | Admitting: *Deleted

## 2016-10-03 ENCOUNTER — Emergency Department (HOSPITAL_COMMUNITY): Payer: Medicare HMO

## 2016-10-03 DIAGNOSIS — Z833 Family history of diabetes mellitus: Secondary | ICD-10-CM | POA: Diagnosis not present

## 2016-10-03 DIAGNOSIS — R200 Anesthesia of skin: Secondary | ICD-10-CM | POA: Insufficient documentation

## 2016-10-03 DIAGNOSIS — G609 Hereditary and idiopathic neuropathy, unspecified: Secondary | ICD-10-CM | POA: Diagnosis not present

## 2016-10-03 DIAGNOSIS — Z8249 Family history of ischemic heart disease and other diseases of the circulatory system: Secondary | ICD-10-CM | POA: Diagnosis not present

## 2016-10-03 DIAGNOSIS — H02402 Unspecified ptosis of left eyelid: Secondary | ICD-10-CM | POA: Diagnosis not present

## 2016-10-03 DIAGNOSIS — N400 Enlarged prostate without lower urinary tract symptoms: Secondary | ICD-10-CM | POA: Diagnosis not present

## 2016-10-03 DIAGNOSIS — I6200 Nontraumatic subdural hemorrhage, unspecified: Secondary | ICD-10-CM | POA: Diagnosis not present

## 2016-10-03 DIAGNOSIS — Z7982 Long term (current) use of aspirin: Secondary | ICD-10-CM | POA: Insufficient documentation

## 2016-10-03 DIAGNOSIS — M1990 Unspecified osteoarthritis, unspecified site: Secondary | ICD-10-CM | POA: Insufficient documentation

## 2016-10-03 DIAGNOSIS — Z823 Family history of stroke: Secondary | ICD-10-CM | POA: Insufficient documentation

## 2016-10-03 DIAGNOSIS — Z8711 Personal history of peptic ulcer disease: Secondary | ICD-10-CM | POA: Diagnosis not present

## 2016-10-03 DIAGNOSIS — Z79899 Other long term (current) drug therapy: Secondary | ICD-10-CM | POA: Diagnosis not present

## 2016-10-03 DIAGNOSIS — Z720 Tobacco use: Secondary | ICD-10-CM | POA: Diagnosis present

## 2016-10-03 DIAGNOSIS — R4781 Slurred speech: Secondary | ICD-10-CM | POA: Diagnosis not present

## 2016-10-03 DIAGNOSIS — R29810 Facial weakness: Secondary | ICD-10-CM | POA: Diagnosis not present

## 2016-10-03 DIAGNOSIS — S065X9A Traumatic subdural hemorrhage with loss of consciousness of unspecified duration, initial encounter: Secondary | ICD-10-CM

## 2016-10-03 DIAGNOSIS — R270 Ataxia, unspecified: Secondary | ICD-10-CM | POA: Diagnosis not present

## 2016-10-03 DIAGNOSIS — D649 Anemia, unspecified: Secondary | ICD-10-CM | POA: Insufficient documentation

## 2016-10-03 DIAGNOSIS — M4712 Other spondylosis with myelopathy, cervical region: Secondary | ICD-10-CM | POA: Insufficient documentation

## 2016-10-03 DIAGNOSIS — R69 Illness, unspecified: Secondary | ICD-10-CM | POA: Diagnosis not present

## 2016-10-03 DIAGNOSIS — X58XXXS Exposure to other specified factors, sequela: Secondary | ICD-10-CM | POA: Diagnosis not present

## 2016-10-03 DIAGNOSIS — F419 Anxiety disorder, unspecified: Secondary | ICD-10-CM | POA: Insufficient documentation

## 2016-10-03 DIAGNOSIS — S065X0S Traumatic subdural hemorrhage without loss of consciousness, sequela: Secondary | ICD-10-CM | POA: Insufficient documentation

## 2016-10-03 DIAGNOSIS — S065X0A Traumatic subdural hemorrhage without loss of consciousness, initial encounter: Secondary | ICD-10-CM | POA: Diagnosis not present

## 2016-10-03 DIAGNOSIS — F17200 Nicotine dependence, unspecified, uncomplicated: Secondary | ICD-10-CM | POA: Diagnosis not present

## 2016-10-03 DIAGNOSIS — J449 Chronic obstructive pulmonary disease, unspecified: Secondary | ICD-10-CM | POA: Diagnosis not present

## 2016-10-03 DIAGNOSIS — R55 Syncope and collapse: Secondary | ICD-10-CM | POA: Diagnosis not present

## 2016-10-03 DIAGNOSIS — S065XAA Traumatic subdural hemorrhage with loss of consciousness status unknown, initial encounter: Secondary | ICD-10-CM

## 2016-10-03 DIAGNOSIS — R269 Unspecified abnormalities of gait and mobility: Secondary | ICD-10-CM

## 2016-10-03 DIAGNOSIS — I4581 Long QT syndrome: Secondary | ICD-10-CM | POA: Diagnosis not present

## 2016-10-03 DIAGNOSIS — E785 Hyperlipidemia, unspecified: Secondary | ICD-10-CM | POA: Diagnosis not present

## 2016-10-03 DIAGNOSIS — R0602 Shortness of breath: Secondary | ICD-10-CM | POA: Diagnosis not present

## 2016-10-03 HISTORY — DX: Benign prostatic hyperplasia without lower urinary tract symptoms: N40.0

## 2016-10-03 HISTORY — DX: Slurred speech: R47.81

## 2016-10-03 HISTORY — DX: Traumatic subdural hemorrhage with loss of consciousness of unspecified duration, initial encounter: S06.5X9A

## 2016-10-03 HISTORY — DX: Tobacco use: Z72.0

## 2016-10-03 HISTORY — DX: Traumatic subdural hemorrhage with loss of consciousness status unknown, initial encounter: S06.5XAA

## 2016-10-03 LAB — CBC
HCT: 33.8 % — ABNORMAL LOW (ref 39.0–52.0)
Hemoglobin: 11.5 g/dL — ABNORMAL LOW (ref 13.0–17.0)
MCH: 33.9 pg (ref 26.0–34.0)
MCHC: 34 g/dL (ref 30.0–36.0)
MCV: 99.7 fL (ref 78.0–100.0)
PLATELETS: 249 10*3/uL (ref 150–400)
RBC: 3.39 MIL/uL — ABNORMAL LOW (ref 4.22–5.81)
RDW: 13.9 % (ref 11.5–15.5)
WBC: 8.3 10*3/uL (ref 4.0–10.5)

## 2016-10-03 LAB — DIFFERENTIAL
BASOS PCT: 0 %
Basophils Absolute: 0 10*3/uL (ref 0.0–0.1)
EOS PCT: 1 %
Eosinophils Absolute: 0.1 10*3/uL (ref 0.0–0.7)
Lymphocytes Relative: 17 %
Lymphs Abs: 1.4 10*3/uL (ref 0.7–4.0)
MONO ABS: 0.6 10*3/uL (ref 0.1–1.0)
Monocytes Relative: 7 %
NEUTROS ABS: 6.1 10*3/uL (ref 1.7–7.7)
NEUTROS PCT: 75 %

## 2016-10-03 LAB — COMPREHENSIVE METABOLIC PANEL
ALT: 16 U/L — AB (ref 17–63)
ANION GAP: 8 (ref 5–15)
AST: 26 U/L (ref 15–41)
Albumin: 3.4 g/dL — ABNORMAL LOW (ref 3.5–5.0)
Alkaline Phosphatase: 80 U/L (ref 38–126)
BUN: 9 mg/dL (ref 6–20)
CHLORIDE: 107 mmol/L (ref 101–111)
CO2: 24 mmol/L (ref 22–32)
Calcium: 8.8 mg/dL — ABNORMAL LOW (ref 8.9–10.3)
Creatinine, Ser: 0.98 mg/dL (ref 0.61–1.24)
GFR calc non Af Amer: >60 mL/min (ref >60)
Glucose, Bld: 171 mg/dL — ABNORMAL HIGH (ref 65–99)
Potassium: 4 mmol/L (ref 3.5–5.1)
Sodium: 139 mmol/L (ref 135–145)
Total Bilirubin: 0.5 mg/dL (ref 0.3–1.2)
Total Protein: 5.9 g/dL — ABNORMAL LOW (ref 6.5–8.1)

## 2016-10-03 LAB — I-STAT TROPONIN, ED: Troponin i, poc: 0 ng/mL (ref 0.00–0.08)

## 2016-10-03 LAB — PROTIME-INR
INR: 1.19
Prothrombin Time: 15.2 seconds (ref 11.4–15.2)

## 2016-10-03 LAB — APTT: aPTT: 45 seconds — ABNORMAL HIGH (ref 24–36)

## 2016-10-03 MED ORDER — ONDANSETRON HCL 4 MG/2ML IJ SOLN: 4.0000 mg | Freq: Three times a day (TID) | INTRAMUSCULAR | Status: DC | PRN

## 2016-10-03 MED ORDER — ADULT MULTIVITAMIN W/MINERALS CH
1.0000 | ORAL_TABLET | Freq: Every day | ORAL | Status: DC
  Administered 2016-10-04: 1 via ORAL
  Filled 2016-10-03: qty 1

## 2016-10-03 MED ORDER — DIAZEPAM 5 MG PO TABS: 5.0000 mg | ORAL_TABLET | Freq: Every evening | ORAL | Status: DC | PRN

## 2016-10-03 MED ORDER — ATORVASTATIN CALCIUM 10 MG PO TABS: 10.0000 mg | ORAL_TABLET | Freq: Every day | ORAL | Status: DC

## 2016-10-03 MED ORDER — SENNOSIDES-DOCUSATE SODIUM 8.6-50 MG PO TABS: 1.0000 | ORAL_TABLET | Freq: Every evening | ORAL | Status: DC | PRN

## 2016-10-03 MED ORDER — ZOLPIDEM TARTRATE 5 MG PO TABS
5.0000 mg | ORAL_TABLET | Freq: Every evening | ORAL | Status: DC | PRN
Start: 2016-10-03 — End: 2016-10-03

## 2016-10-03 MED ORDER — ACETAMINOPHEN 325 MG PO TABS: 650.0000 mg | ORAL_TABLET | Freq: Four times a day (QID) | ORAL | Status: DC | PRN

## 2016-10-03 MED ORDER — SODIUM CHLORIDE 0.9 % IV SOLN
INTRAVENOUS | Status: DC
  Administered 2016-10-04: 02:00:00 via INTRAVENOUS

## 2016-10-03 MED ORDER — HYDROCODONE-ACETAMINOPHEN 5-325 MG PO TABS: 1.0000 | ORAL_TABLET | Freq: Every day | ORAL | Status: DC | PRN

## 2016-10-03 MED ORDER — TAMSULOSIN HCL 0.4 MG PO CAPS: 0.4000 mg | ORAL_CAPSULE | Freq: Every day | ORAL | Status: DC

## 2016-10-03 MED ORDER — STROKE: EARLY STAGES OF RECOVERY BOOK
Freq: Once | Status: AC
  Administered 2016-10-04: 02:00:00
  Filled 2016-10-03: qty 1

## 2016-10-03 MED ORDER — VITAMIN D 1000 UNITS PO TABS
2000.0000 [IU] | ORAL_TABLET | Freq: Every day | ORAL | Status: DC
  Administered 2016-10-04: 2000 [IU] via ORAL
  Filled 2016-10-03: qty 2

## 2016-10-03 MED ORDER — OMEGA-3-ACID ETHYL ESTERS 1 G PO CAPS
1.0000 g | ORAL_CAPSULE | Freq: Every day | ORAL | Status: DC
  Administered 2016-10-04: 1 g via ORAL
  Filled 2016-10-03: qty 1

## 2016-10-03 MED ORDER — GABAPENTIN 300 MG PO CAPS
300.0000 mg | ORAL_CAPSULE | Freq: Two times a day (BID) | ORAL | Status: DC | PRN
Start: 2016-10-03 — End: 2016-10-04

## 2016-10-03 MED ORDER — NICOTINE 21 MG/24HR TD PT24
21.0000 mg | MEDICATED_PATCH | Freq: Every day | TRANSDERMAL | Status: DC
  Filled 2016-10-03: qty 1

## 2016-10-03 NOTE — H&P (Signed)
History and Physical    Curtis Gentry DPO:[id number] DOB: 1945-12-04 DOA: 10/03/2016  Referring MD/NP/PA:   PCP: [name], MD   Patient coming from:  The patient is coming from home.  At baseline, pt is independent for most of ADL. SNF  Assistant living facility   [facility].       Chief Complaint: Syncope, slurred speech, ataxia, left facial droop, left ptosis  HPI: Curtis Gentry is a 71 y.o. male with medical history significant of hyperlipidemia, perforated peptic ulcer disease, anemia, arthritis, BPH, tobacco abuse, anxiety, who presents with syncope, slurred speech, ataxia, left facial droop, left ptosis.  Per his ex-wife, pt has been having syncope issue, almost once a month, which has been going on for nearly 2 years. Today, he was noted to have slurred speech and left eye ptosis by his Ex-wife when he was driving. Per his ex-wife, patient also had possible left facial droop. Patient does not have unilateral weakness, numbness or tingling in extremities. No hearing loss. Patient has chronic bilateral arm numbness, which has not changed. Patient does not have chest pain, shortness rest, cough, fever or chills. No nausea, vomiting, diarrhea or abdominal pain. Denies symptoms of UTI.  ED Course: pt was found to have  WBC 12.3, INR 1.19, PTT 45, troponin negative, electrolytes renal function okay, temperature normal, no tachycardia, O2 sats 97% on room air, negative chest x-ray. 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 Patient is placed on telemetry bed for observation. Neurosurgery will be consulted by EDP (Dr. Arnetha Massy).  Review of Systems:   General: no fevers, chills, no body weight gain, has fatigue HEENT: no blurry vision, hearing changes or sore throat Respiratory: no dyspnea, coughing, wheezing CV: no chest pain, no palpitations GI: no nausea, vomiting, abdominal pain, diarrhea,  constipation GU: no dysuria, burning on urination, increased urinary frequency, hematuria  Ext: no leg edema Neuro: has syncope, slurred speech, ataxia, left facial droop, left ptosis Skin: no rash, no skin tear. MSK: No muscle spasm, no deformity, no limitation of range of movement in spin Heme: No easy bruising.  Travel history: No recent long distant travel.  Allergy: No Known Allergies  Past Medical History:  Diagnosis Date  . Anemia   . Anxiety   . Arthritis   . Erectile dysfunction   . Hyperlipidemia   . Peptic ulcer disease   . Perforated ulcer ([place])   . Unspecified disease of spinal cord    myelopahty, cervical spine    Past Surgical History:  Procedure Laterality Date  . BACK SURGERY  2011   3 level ACD with fusion and plating  . HEMORRHOID SURGERY    . PARTIAL GASTRECTOMY      Social History:  reports that he has been smoking.  He has a 50.00 pack-year smoking history. He uses smokeless tobacco. He reports that he does not drink alcohol or use drugs.  Family History:  Family History  Problem Relation Age of Onset  . Stroke Mother   . COPD Father        ?  Marland Kitchen Heart attack Father   . CAD Father   . Diabetes Unknown        family members  . CAD Unknown        family members  . Colon cancer Neg Hx   . Neuropathy Neg Hx      Prior to Admission medications  Medication Sig Start Date End Date Taking? Authorizing Provider  aspirin EC 81 MG tablet Take 81 mg by mouth daily.   Yes [provider]  atorvastatin (LIPITOR) 10 MG tablet Take 10 mg by mouth at bedtime.    Yes [provider]  Cholecalciferol (VITAMIN D) 2000 units tablet Take 2,000 Units by mouth daily.   Yes [provider]  diazepam (VALIUM) 5 MG tablet Take 5 mg by mouth at bedtime as needed for anxiety (agitation).  04/29/16  Yes [provider]  gabapentin (NEURONTIN) 300 MG capsule Take 300 mg by mouth 2 (two) times daily as needed (nerve pain).    Yes  [provider]  HYDROcodone-acetaminophen (NORCO/VICODIN) 5-325 MG per tablet Take 1 tablet by mouth daily as needed for severe pain.  02/23/14  Yes [provider]  Multiple Vitamin (MULTIVITAMIN WITH MINERALS) TABS tablet Take 1 tablet by mouth daily.   Yes [provider]  Omega-3 Fatty Acids (FISH OIL PO) Take 1 capsule by mouth 2 (two) times daily.   Yes [provider]  tamsulosin (FLOMAX) 0.4 MG CAPS capsule Take 0.4 mg by mouth daily after supper.  06/25/16  Yes [provider]    Physical Exam: Vitals:   10/03/16 2348 10/04/16 0000 10/04/16 0015 10/04/16 0030  BP:  135/73 125/65 135/80  Pulse:  (!) 56 64 60  Resp:  19 18 19   Temp: 98 F (36.7 C)     TempSrc:      SpO2:  97% 99% 99%   General: Not in acute distress HEENT:       Eyes: PERRL, EOMI, no scleral icterus. Has ptosis in left eye       ENT: No discharge from the ears and nose, no pharynx injection, no tonsillar enlargement.        Neck: No JVD, no bruit, no mass felt. Heme: No neck lymph node enlargement. Cardiac: S1/S2, RRR, No murmurs, No gallops or rubs. Respiratory: No rales, wheezing, rhonchi or rubs. GI: Soft, nondistended, nontender, no rebound pain, no organomegaly, BS present. GU: No hematuria Ext: No pitting leg edema bilaterally. 2+DP/PT pulse bilaterally. Musculoskeletal: No joint deformities, No joint redness or warmth, no limitation of ROM in spin. Skin: No rashes.  Neuro: Alert, oriented X3, cranial nerves II-XII grossly intact. Moves all extremities normally. Muscle strength 5/5 in all extremities, sensation to light touch intact. Brachial reflex 2+ bilaterally.Negative Babinski's sign. Normal finger to nose test. Psych: Patient is not psychotic, no suicidal or hemocidal ideation.  Labs on Admission: I have personally reviewed following labs and imaging studies  CBC:  Recent Labs Lab 10/03/16 1810  WBC 8.3  NEUTROABS 6.1  HGB 11.5*  HCT 33.8*    MCV 99.7  PLT 355   Basic Metabolic Panel:  Recent Labs Lab 10/03/16 1810  NA 139  K 4.0  CL 107  CO2 24  GLUCOSE 171*  BUN 9  CREATININE 0.98  CALCIUM 8.8*   GFR: CrCl cannot be calculated (Unknown ideal weight.). Liver Function Tests:  Recent Labs Lab 10/03/16 1810  AST 26  ALT 16*  ALKPHOS 80  BILITOT 0.5  PROT 5.9*  ALBUMIN 3.4*   No results for input(s): LIPASE, AMYLASE in the last 168 hours. No results for input(s): AMMONIA in the last 168 hours. Coagulation Profile:  Recent Labs Lab 10/03/16 1810  INR 1.19   Cardiac Enzymes: No results for input(s): CKTOTAL, CKMB, CKMBINDEX, TROPONINI in the last 168 hours. BNP (last 3 results)  No results for input(s): PROBNP in the last 8760 hours. HbA1C: No results for input(s): HGBA1C in the last 72 hours. CBG: No results for input(s): GLUCAP in the last 168 hours. Lipid Profile: No results for input(s): CHOL, HDL, LDLCALC, TRIG, CHOLHDL, LDLDIRECT in the last 72 hours. Thyroid Function Tests: No results for input(s): TSH, T4TOTAL, FREET4, T3FREE, THYROIDAB in the last 72 hours. Anemia Panel: No results for input(s): VITAMINB12, FOLATE, FERRITIN, TIBC, IRON, RETICCTPCT in the last 72 hours. Urine analysis:    Component Value Date/Time   COLORURINE STRAW (A) 07/08/2016 1745   APPEARANCEUR CLEAR 07/08/2016 1745   LABSPEC 1.004 (L) 07/08/2016 1745   PHURINE 6.0 07/08/2016 1745   GLUCOSEU 50 (A) 07/08/2016 1745   HGBUR NEGATIVE 07/08/2016 1745   BILIRUBINUR NEGATIVE 07/08/2016 1745   KETONESUR NEGATIVE 07/08/2016 1745   PROTEINUR NEGATIVE 07/08/2016 1745   UROBILINOGEN 0.2 08/20/2011 1933   NITRITE NEGATIVE 07/08/2016 1745   LEUKOCYTESUR NEGATIVE 07/08/2016 1745   Sepsis Labs: @LABRCNTIP (procalcitonin:4,lacticidven:4) )No results found for this or any previous visit (from the past 240 hour(s)).   Radiological Exams on Admission: Dg Chest 2 View  Result Date: 10/03/2016 CLINICAL DATA:  Shortness of  breath EXAM: CHEST  2 VIEW COMPARISON:  Chest radiograph 08/20/2011 FINDINGS: Cardiomediastinal contours are normal. No pleural effusion or pneumothorax. Mild interstitial opacities diffusely. No focal consolidation. Lungs remain hyperexpanded. IMPRESSION: COPD without acute cardiopulmonary disease. Electronically Signed   By: Ulyses Jarred M.D.   On: 10/03/2016 19:08   Ct Head Wo Contrast  Result Date: 10/03/2016 CLINICAL DATA:  Left-sided facial droop with slurred speech EXAM: CT HEAD WITHOUT CONTRAST TECHNIQUE: Contiguous axial images were obtained from the base of the skull through the vertex without intravenous contrast. COMPARISON:  Head CT 01/14/2010 FINDINGS: Brain: There is a hypodense collection over the left parietal convexity measuring approximately 7 mm in thickness. No intraparenchymal hemorrhage. No CT evidence of acute cortical infarct. No midline shift or other mass effect. No hydrocephalus. There is periventricular hypoattenuation compatible with chronic microvascular disease. No evidence of acute cortical infarct. Vascular: No hyperdense vessel or unexpected calcification. Skull: Normal visualized skull base, calvarium and extracranial soft tissues. Sinuses/Orbits: No sinus fluid levels or advanced mucosal thickening. No mastoid effusion. Normal orbits. IMPRESSION: 1. 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 No evidence of acute hemorrhage. 2. No associated mass effect. 3. Chronic microvascular ischemia without CT evidence of acute infarct. Critical Value/emergent results were called by telephone at the time of interpretation on 10/03/2016 at 6:53 pm to Dr. Lajean Saver , who verbally acknowledged these results. Electronically Signed   By: Ulyses Jarred M.D.   On: 10/03/2016 18:54     EKG: Independently reviewed.  Not done in ED, will get one.   Assessment/Plan Principal Problem:   Slurred speech Active Problems:   Syncope and collapse    Hyperlipidemia   Tobacco abuse   BPH (benign prostatic hyperplasia)   SDH (subdural hematoma) (H[facility])  Slurred speech: Slurred speech, ataxia, left facial droop, left ptosis. 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 CT finding cannot fully explain patient's symptoms, will need to to rule out stroke/TIA and do stroke work up.  - will place to tele bed - Risk factor modification: HgbA1c, fasting lipid panel - MRI, MRA of the brain without contrast  - PT consult, OT consult - Bedside swallowing screen was ordered, will get speech consult in  AM - 2 d Echocardiogram  - Carotid dopplers   Syncope and collapse: Etiology is not clear. 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 Neurosurgery will be consulted by EDP (Dr. Arnetha Massy) -will hold ASA -Frequent neuro check  HLD: Lipitor   BPH: stable - Continue Flomax  Anxiety -continue home Valium prn QHS  Tobacco abuse: -Did counseling about importance of quitting smoking -Nicotine patch  DVT ppx: SCD Code Status: Full code Family Communication:  Yes, patient's Ex-wife at bed side Disposition Plan:  Anticipate discharge back to previous home environment Consults called:  Neurosurgery be consulted by EDP (Dr. Arnetha Massy) Admission status: Obs / tele    Date of Service 10/04/2016    Ivor Costa [facility] Pager [phone]  If 7PM-7AM, please contact night-coverage [email or web address] Password [email or web address] 10/04/2016, 1:17 AM

## 2016-10-03 NOTE — ED Triage Notes (Signed)
Pt reports feeling fine earlier today, had onset around 1500 of sob. Family reports that his skin was gray and she thought he had facial droop and slurred speech. Pt denies sob at this time, no chronic numbness to his arm. Family reports unsteady gait. At triage, no unilateral weakness noted, no facial droop noted, speech is clear.

## 2016-10-03 NOTE — ED Notes (Signed)
REpaged neurosurgery/[facility]

## 2016-10-03 NOTE — ED Provider Notes (Signed)
[facility] DEPT Provider Note   CSN: [id number] Arrival date & time: 10/03/16  1801     History   Chief Complaint Chief Complaint  Patient presents with  . Weakness  . Shortness of Breath    HPI Curtis Gentry is a 71 y.o. male.With history of a HLD, peptic ulcer disease, remote back surgery with resulting difficulty walking who presents with concerns for stroke. Ex-wife at bedside provides most of history. She was passenger while patient was driving to bank today when he became very shaky, reportedly left eye appeared "tired", and when they arrived at the bank he seemed to be dragging his left leg more than usual. When he arrived home he had an episode of slurred speech, which prompted them to take him to the ED. She reports that symptoms have resolved since arrival to the ED.  Patient reports he is currently asymptomatic, and requests to go home. He does admit to some syncopal events occurring every several months, although his ex-wife reports that he has a syncopal event at least once monthly. He presented to ED 5/15 after syncopal event. He has had a holter monitor in June, negative for arrhythmia.   HPI  Past Medical History:  Diagnosis Date  . Anemia   . Anxiety   . Arthritis   . Erectile dysfunction   . Hyperlipidemia   . Peptic ulcer disease   . Perforated ulcer ([place])   . Unspecified disease of spinal cord    myelopahty, cervical spine    Patient Active Problem List   Diagnosis Date Noted  . Tobacco abuse 10/03/2016  . BPH (benign prostatic hyperplasia) 10/03/2016  . SDH (subdural hematoma) ([place]) 10/03/2016  . Slurred speech 10/03/2016  . Hyperlipidemia   . Anxiety   . 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 Abnormality of gait 05/03/2015  . Hereditary and idiopathic peripheral neuropathy 05/03/2015  . Syncope and collapse 08/20/2011  . ANEMIA-UNSPECIFIED 09/06/2008  . DYSPHAGIA 09/06/2008    Past Surgical History:  Procedure Laterality  Date  . BACK SURGERY  2011   3 level ACD with fusion and plating  . HEMORRHOID SURGERY    . PARTIAL GASTRECTOMY         Home Medications    Prior to Admission medications   Medication Sig Start Date End Date Taking? Authorizing Provider  aspirin EC 81 MG tablet Take 81 mg by mouth daily.   Yes [provider]  atorvastatin (LIPITOR) 10 MG tablet Take 10 mg by mouth at bedtime.    Yes [provider]  Cholecalciferol (VITAMIN D) 2000 units tablet Take 2,000 Units by mouth daily.   Yes [provider]  diazepam (VALIUM) 5 MG tablet Take 5 mg by mouth at bedtime as needed for anxiety (agitation).  04/29/16  Yes [provider]  gabapentin (NEURONTIN) 300 MG capsule Take 300 mg by mouth 2 (two) times daily as needed (nerve pain).    Yes [provider]  HYDROcodone-acetaminophen (NORCO/VICODIN) 5-325 MG per tablet Take 1 tablet by mouth daily as needed for severe pain.  02/23/14  Yes [provider]  Multiple Vitamin (MULTIVITAMIN WITH MINERALS) TABS tablet Take 1 tablet by mouth daily.   Yes [provider]  Omega-3 Fatty Acids (FISH OIL PO) Take 1 capsule by mouth 2 (two) times daily.   Yes [provider]  tamsulosin (FLOMAX) 0.4 MG CAPS capsule Take 0.4 mg by mouth daily after supper.  06/25/16  Yes [provider]  Family History Family History  Problem Relation Age of Onset  . Stroke Mother   . COPD Father        ?  Marland Kitchen Heart attack Father   . CAD Father   . Diabetes Unknown        family members  . CAD Unknown        family members  . Colon cancer Neg Hx   . Neuropathy Neg Hx     Social History Social History  Substance Use Topics  . Smoking status: Current Every Day Smoker    Packs/day: 1.00    Years: 50.00  . Smokeless tobacco: Current User  . Alcohol use No     Comment: 2-4 ounces alcohol per day     Allergies   Patient has no known allergies.   Review of Systems Review of  Systems  Constitutional: Negative for chills and fever.  HENT: Negative for ear pain and sore throat.   Eyes: Negative for pain and visual disturbance.  Respiratory: Negative for cough and shortness of breath.   Cardiovascular: Negative for chest pain and palpitations.  Gastrointestinal: Negative for abdominal pain and vomiting.  Genitourinary: Negative for dysuria and hematuria.  Musculoskeletal: Negative for arthralgias and back pain.  Skin: Negative for color change and rash.  Neurological: Positive for syncope, speech difficulty and weakness. Negative for seizures.  All other systems reviewed and are negative.    Physical Exam Updated Vital Signs BP 120/74   Pulse 79   Temp 97.8 F (36.6 C) (Oral)   Resp 17   SpO2 99%   Physical Exam  Constitutional: He is oriented to person, place, and time. He appears well-developed.  Thin appearing   HENT:  Head: Normocephalic and atraumatic.  Eyes: Conjunctivae are normal.  Neck: Neck supple.  Cardiovascular: Normal rate and regular rhythm.   No murmur heard. Pulmonary/Chest: Effort normal and breath sounds normal. No respiratory distress.  Abdominal: Soft. There is no tenderness.  Musculoskeletal: He exhibits no edema.  Neurological: He is alert and oriented to person, place, and time. No cranial nerve deficit or sensory deficit. He exhibits normal muscle tone. Coordination normal.  Skin: Skin is warm and dry.  Psychiatric: He has a normal mood and affect.  Nursing note and vitals reviewed.    ED Treatments / Results  Labs (all labs ordered are listed, but only abnormal results are displayed) Labs Reviewed  APTT - Abnormal; Notable for the following:       Result Value   aPTT 45 (*)    All other components within normal limits  CBC - Abnormal; Notable for the following:    RBC 3.39 (*)    Hemoglobin 11.5 (*)    HCT 33.8 (*)    All other components within normal limits  COMPREHENSIVE METABOLIC PANEL - Abnormal; Notable  for the following:    Glucose, Bld 171 (*)    Calcium 8.8 (*)    Total Protein 5.9 (*)    Albumin 3.4 (*)    ALT 16 (*)    All other components within normal limits  PROTIME-INR  DIFFERENTIAL  I-STAT TROPONIN, ED    EKG  EKG Interpretation None       Radiology Dg Chest 2 View  Result Date: 10/03/2016 CLINICAL DATA:  Shortness of breath EXAM: CHEST  2 VIEW COMPARISON:  Chest radiograph 08/20/2011 FINDINGS: Cardiomediastinal contours are normal. No pleural effusion or pneumothorax. Mild interstitial opacities diffusely. No focal consolidation. Lungs remain hyperexpanded. IMPRESSION: COPD  without acute cardiopulmonary disease. Electronically Signed   By: Ulyses Jarred M.D.   On: 10/03/2016 19:08   Ct Head Wo Contrast  Result Date: 10/03/2016 CLINICAL DATA:  Left-sided facial droop with slurred speech EXAM: CT HEAD WITHOUT CONTRAST TECHNIQUE: Contiguous axial images were obtained from the base of the skull through the vertex without intravenous contrast. COMPARISON:  Head CT 01/14/2010 FINDINGS: Brain: There is a hypodense collection over the left parietal convexity measuring approximately 7 mm in thickness. No intraparenchymal hemorrhage. No CT evidence of acute cortical infarct. No midline shift or other mass effect. No hydrocephalus. There is periventricular hypoattenuation compatible with chronic microvascular disease. No evidence of acute cortical infarct. Vascular: No hyperdense vessel or unexpected calcification. Skull: Normal visualized skull base, calvarium and extracranial soft tissues. Sinuses/Orbits: No sinus fluid levels or advanced mucosal thickening. No mastoid effusion. Normal orbits. IMPRESSION: 1. 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 No evidence of acute hemorrhage. 2. No associated mass effect. 3. Chronic microvascular ischemia without CT evidence of acute infarct. Critical Value/emergent results were called by telephone at the time of  interpretation on 10/03/2016 at 6:53 pm to Dr. Lajean Saver , who verbally acknowledged these results. Electronically Signed   By: Ulyses Jarred M.D.   On: 10/03/2016 18:54    Procedures Procedures (including critical care time)  Medications Ordered in ED Medications  Vitamin D 2,000 Units (not administered)  diazepam (VALIUM) tablet 5 mg (not administered)  multivitamin with minerals tablet 1 tablet (not administered)  omega-3 acid ethyl esters (LOVAZA) capsule 1 g (not administered)  tamsulosin (FLOMAX) capsule 0.4 mg (not administered)  HYDROcodone-acetaminophen (NORCO/VICODIN) 5-325 MG per tablet 1 tablet (not administered)  gabapentin (NEURONTIN) capsule 300 mg (not administered)  atorvastatin (LIPITOR) tablet 10 mg (not administered)  nicotine (NICODERM CQ - dosed in mg/24 hours) patch 21 mg (not administered)  0.9 %  sodium chloride infusion (not administered)     Initial Impression / Assessment and Plan / ED Course  I have reviewed the triage vital signs and the nursing notes.  Pertinent labs & imaging results that were available during my care of the patient were reviewed by me and considered in my medical decision making (see chart for details).    Patient is a 71 y/o male with history of a HLD, peptic ulcer disease, remote back surgery with resulting difficulty walking , with recurrent syncopal events thought 2/2 orthostasis who presents with concerns for possible stroke.  Reported symptoms of left facial weakness, slurred speech, and worsening weakness on the left side have all resolved by the time patient presents to the ED. Patient arrived hemodynamically stable, in no acute distress. Exam as above, significant for nonfocal neurological examination.  Labs stable. Chest x-ray negative for acute findings. 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  Given history of syncopal event in the  setting of subdural hematoma, patient warrants admission. Would also consider further TIA/CVA work up.   Discussed with hospitalist for admission.   Patient and plan of care discussed with Attending physician, Dr. Ashok Cordia.     Final Clinical Impressions(s) / ED Diagnoses   Final diagnoses:  Subdural hematoma ([place])  Syncope, unspecified syncope type    New Prescriptions New Prescriptions   No medications on file     Arnetha Massy, MD 10/03/16 1610    Lajean Saver, MD 10/04/16 [phone]

## 2016-10-04 ENCOUNTER — Observation Stay (HOSPITAL_COMMUNITY)
Admit: 2016-10-04 | Discharge: 2016-10-04 | Disposition: A | Discharge status: Home / Self Care | Payer: Medicare HMO | Attending: Internal Medicine | Admitting: Internal Medicine

## 2016-10-04 ENCOUNTER — Observation Stay (HOSPITAL_COMMUNITY): Payer: Medicare HMO

## 2016-10-04 DIAGNOSIS — I6202 Nontraumatic subacute subdural hemorrhage: Secondary | ICD-10-CM | POA: Diagnosis not present

## 2016-10-04 DIAGNOSIS — N400 Enlarged prostate without lower urinary tract symptoms: Secondary | ICD-10-CM | POA: Diagnosis not present

## 2016-10-04 DIAGNOSIS — R269 Unspecified abnormalities of gait and mobility: Secondary | ICD-10-CM

## 2016-10-04 DIAGNOSIS — R4781 Slurred speech: Secondary | ICD-10-CM | POA: Diagnosis not present

## 2016-10-04 DIAGNOSIS — I6203 Nontraumatic chronic subdural hemorrhage: Secondary | ICD-10-CM | POA: Diagnosis not present

## 2016-10-04 DIAGNOSIS — I6200 Nontraumatic subdural hemorrhage, unspecified: Secondary | ICD-10-CM | POA: Diagnosis not present

## 2016-10-04 DIAGNOSIS — R55 Syncope and collapse: Secondary | ICD-10-CM | POA: Diagnosis not present

## 2016-10-04 DIAGNOSIS — Z720 Tobacco use: Secondary | ICD-10-CM | POA: Diagnosis not present

## 2016-10-04 LAB — URINALYSIS, COMPLETE (UACMP) WITH MICROSCOPIC
BILIRUBIN URINE: NEGATIVE
Glucose, UA: NEGATIVE mg/dL
HGB URINE DIPSTICK: NEGATIVE
Ketones, ur: NEGATIVE mg/dL
Leukocytes, UA: NEGATIVE
NITRITE: NEGATIVE
PH: 5 (ref 5.0–8.0)
Protein, ur: NEGATIVE mg/dL
RBC / HPF: NONE SEEN RBC/hpf (ref 0–5)
SPECIFIC GRAVITY, URINE: 1.006 (ref 1.005–1.030)
Squamous Epithelial / LPF: NONE SEEN

## 2016-10-04 LAB — LIPID PANEL
CHOL/HDL RATIO: 2.9 ratio
Cholesterol: 104 mg/dL (ref 0–200)
HDL: 36 mg/dL — AB (ref >40)
LDL Cholesterol: 58 mg/dL (ref 0–99)
Triglycerides: 52 mg/dL (ref <150)
VLDL: 10 mg/dL (ref 0–40)

## 2016-10-04 MED ORDER — SENNOSIDES-DOCUSATE SODIUM 8.6-50 MG PO TABS: 1.0000 | ORAL_TABLET | Freq: Every evening | ORAL | 0 refills | Status: DC | PRN

## 2016-10-04 NOTE — ED Notes (Signed)
Pt returned from MRI °

## 2016-10-04 NOTE — Progress Notes (Signed)
PT Cancellation Note  Patient Details Name: Curtis Gentry MRN: [id number] DOB: Feb 22, 1946   Cancelled Treatment:    Reason Eval/Treat Not Completed: Patient not medically ready. Pt with orders for bedrest. Please update activity order, when appropriate, for PT to proceed with eval. Thank you.   Lorriane Shire 10/04/2016, 8:35 AM

## 2016-10-04 NOTE — Procedures (Signed)
History: 71 year old being evaluated for transient neurological deficit with subdural  Sedation: None  Technique: This is a 21 channel routine scalp EEG performed at the bedside with bipolar and monopolar montages arranged in accordance to the international 10/20 system of electrode placement. One channel was dedicated to EKG recording.    Background: The background consists of intermixed alpha and beta activities. There is a well defined posterior dominant rhythm of 9 Hz that attenuates with eye opening. Sleep is recorded with normal appearing structures.   Photic stimulation: Physiologic driving is now performed  EEG Abnormalities: None  Clinical Interpretation: This normal EEG is recorded in the waking and sleep state. There was no seizure or seizure predisposition recorded on this study. Please note that a normal EEG does not preclude the possibility of epilepsy.   Roland Rack, MD [facility] [phone]  If 7pm- 7am, please page neurology on call as listed in [place].
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
## 2016-10-04 NOTE — Discharge Instructions (Addendum)
Curtis Gentry,  You were in the hospital for concern for stroke. We did an MRI which did not show any stroke but did show evidence of a chronic subdural hemorrhage. Physical and occupational therapists worked with you and recommended outpatient therapy. This chronic subdural hemorrhage will likely resolve on its own. Nothing acute. Please follow-up with your primary care physician. If you notice worsening symptoms of confusion, sleepiness, nausea or vomiting, bad headaches, or if family and friends notice seizure activity, please return for reevaluation. You are on aspirin prior to arrival and since you're taking this with her chronic bleeding, he can continue. The neurosurgeon saw you and does not recommend follow-up. Please also follow-up with your cardiologist that you have been following for your episodes of syncope.
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
## 2016-10-04 NOTE — ED Notes (Signed)
Patient transported to MRI 

## 2016-10-04 NOTE — Progress Notes (Signed)
EEG completed, results pending. 

## 2016-10-04 NOTE — Discharge Summary (Signed)
Physician Discharge Summary  Curtis Gentry SHF:[id number] DOB: 04-02-1945 DOA: 10/03/2016  PCP: [name], MD  Admit date: 10/03/2016 Discharge date: 10/04/2016  Admitted From: Home Disposition: Home  Recommendations for Outpatient Follow-up:  1. Follow up with PCP in 1 week 2. Please follow up on the following pending results: Echocardiogram  Home Health: Outpatient PT/OT Equipment/Devices: None  Discharge Condition: Stable CODE STATUS: Full code Diet recommendation: Heart healthy   Brief/Interim Summary:  Admission HPI written by Ivor Costa, MD   Chief Complaint: Syncope, slurred speech, ataxia, left facial droop, left ptosis  HPI: Curtis Gentry is a 71 y.o. male with medical history significant of hyperlipidemia, perforated peptic ulcer disease, anemia, arthritis, BPH, tobacco abuse, anxiety, who presents with syncope, slurred speech, ataxia, left facial droop, left ptosis.  Per his ex-wife, pt has been having syncope issue, almost once a month, which has been going on for nearly 2 years. Today, he was noted to have slurred speech and left eye ptosis by his Ex-wife when he was driving. Per his ex-wife, patient also had possible left facial droop. Patient does not have unilateral weakness, numbness or tingling in extremities. No hearing loss. Patient has chronic bilateral arm numbness, which has not changed. Patient does not have chest pain, shortness rest, cough, fever or chills. No nausea, vomiting, diarrhea or abdominal pain. Denies symptoms of UTI.  ED Course: pt was found to have  WBC 12.3, INR 1.19, PTT 45, troponin negative, electrolytes renal function okay, temperature normal, no tachycardia, O2 sats 97% on room air, negative chest x-ray. 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 Patient is placed on telemetry bed for observation. Neurosurgery will be consulted by EDP (Dr. Arnetha Massy).    Hospital course:  Slurred speech Unknown etiology. Patient states that he was just tired time and recently woke up. No focal neurological deficits on my exam. CT scan on admission significant for a 7 mm subdural hematoma and MRI confirmed, however, this is chronic. Patient with baseline left arm weakness and bilateral numbness from previous neck surgery.  Subdural hematoma Chronic. Neurosurgery evaluated and reports no further intervention needed in addition to no follow-up needed. Patient without neurological deficits.  Syncope and collapse Patient monitored on telemetry overnight. Cardizem obtained and is pending on discharge. Per chart review, patient has frequent falls as an outpatient. Likely has some cognitive impairment at baseline. EEG unremarkable. Would recommend neurological follow-up as an outpatient. Patient is already being worked up by cardiology as an outpatient for syncope component.  Hyperlipidemia Continued Lipitor  BPH Continued Flomax  Anxiety Continued Valium  Tobacco abuse Patient counseled on admission. Nicotine patch while in the hospital.   Discharge Diagnoses:  Principal Problem:   Slurred speech Active Problems:   Syncope and collapse   Hyperlipidemia   Tobacco abuse   BPH (benign prostatic hyperplasia)   SDH (subdural hematoma) (HCC)    Discharge Instructions   Allergies as of 10/04/2016   No Known Allergies     Medication List    TAKE these medications   aspirin EC 81 MG tablet Take 81 mg by mouth daily.   atorvastatin 10 MG tablet Commonly known as:  LIPITOR Take 10 mg by mouth at bedtime.   diazepam 5 MG tablet Commonly known as:  VALIUM Take 5 mg by mouth at bedtime as needed for anxiety (agitation).   FISH OIL PO Take 1 capsule by mouth 2 (two)  times daily.   gabapentin 300 MG capsule Commonly known as:  NEURONTIN Take 300 mg by mouth 2 (two) times daily as needed (nerve pain).   HYDROcodone-acetaminophen  5-325 MG tablet Commonly known as:  NORCO/VICODIN Take 1 tablet by mouth daily as needed for severe pain.   multivitamin with minerals Tabs tablet Take 1 tablet by mouth daily.   senna-docusate 8.6-50 MG tablet Commonly known as:  Senokot-S Take 1 tablet by mouth at bedtime as needed for mild constipation.   tamsulosin 0.4 MG Caps capsule Commonly known as:  FLOMAX Take 0.4 mg by mouth daily after supper.   Vitamin D 2000 units tablet Take 2,000 Units by mouth daily.       No Known Allergies  Consultations:  Neurosurgery   Procedures/Studies: Dg Chest 2 View  Result Date: 10/03/2016 CLINICAL DATA:  Shortness of breath EXAM: CHEST  2 VIEW COMPARISON:  Chest radiograph 08/20/2011 FINDINGS: Cardiomediastinal contours are normal. No pleural effusion or pneumothorax. Mild interstitial opacities diffusely. No focal consolidation. Lungs remain hyperexpanded. IMPRESSION: COPD without acute cardiopulmonary disease. Electronically Signed   By: Ulyses Jarred M.D.   On: 10/03/2016 19:08   Ct Head Wo Contrast  Result Date: 10/03/2016 CLINICAL DATA:  Left-sided facial droop with slurred speech EXAM: CT HEAD WITHOUT CONTRAST TECHNIQUE: Contiguous axial images were obtained from the base of the skull through the vertex without intravenous contrast. COMPARISON:  Head CT 01/14/2010 FINDINGS: Brain: There is a hypodense collection over the left parietal convexity measuring approximately 7 mm in thickness. No intraparenchymal hemorrhage. No CT evidence of acute cortical infarct. No midline shift or other mass effect. No hydrocephalus. There is periventricular hypoattenuation compatible with chronic microvascular disease. No evidence of acute cortical infarct. Vascular: No hyperdense vessel or unexpected calcification. Skull: Normal visualized skull base, calvarium and extracranial soft tissues. Sinuses/Orbits: No sinus fluid levels or advanced mucosal thickening. No mastoid effusion. Normal orbits.  IMPRESSION: 1. 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 No evidence of acute hemorrhage. 2. No associated mass effect. 3. Chronic microvascular ischemia without CT evidence of acute infarct. Critical Value/emergent results were called by telephone at the time of interpretation on 10/03/2016 at 6:53 pm to Dr. Lajean Saver , who verbally acknowledged these results. Electronically Signed   By: Ulyses Jarred M.D.   On: 10/03/2016 18:54   Mr Brain Wo Contrast  Result Date: 10/04/2016 CLINICAL DATA:  Slurred speech, assess for stroke. Difficulty walking attributed to prior back surgery. History of subdural hematoma. EXAM: MRI HEAD WITHOUT CONTRAST MRA HEAD WITHOUT CONTRAST TECHNIQUE: Multiplanar, multiecho pulse sequences of the brain and surrounding structures were obtained without intravenous contrast. Angiographic images of the head were obtained using MRA technique without contrast. COMPARISON:  CT HEAD October 03, 2016 and MRI head February 14, 2008 and MRI cervical spine March 03, 2008 FINDINGS: MRI HEAD FINDINGS BRAIN: No reduced diffusion to suggest acute ischemia. No susceptibility artifact to suggest hemorrhage. The ventricles and sulci are normal for patient's age. Confluent pontine white matter FLAIR T2 hyperintensities, slightly increased from prior imaging. A few additional scattered subcentimeter supratentorial white matter FLAIR T2 hyperintensities. No suspicious parenchymal signal, mass or mass effect. Low low T1, mild homogeneously bright T2 LEFT holo hemispheric subdural fluid collection. VASCULAR: Normal major intracranial vascular flow voids present at skull base. SKULL AND UPPER CERVICAL SPINE: No abnormal sellar expansion. No suspicious calvarial bone marrow signal. Craniocervical junction maintained. Status post C3-4 ACDF. Cervical myelomalacia, present in 2009. SINUSES/ORBITS: Trace  RIGHT mastoid effusion paranasal sinus are well aerated. The included ocular  globes and orbital contents are non-suspicious. OTHER: Patient is edentulous. MRA HEAD FINDINGS ANTERIOR CIRCULATION: Normal flow related enhancement of the included cervical, petrous, cavernous and supraclinoid internal carotid arteries. Patent anterior communicating artery. Patent anterior and middle cerebral arteries, including distal segments. No large vessel occlusion, high-grade stenosis, aneurysm. POSTERIOR CIRCULATION: Codominant vertebral artery's. Basilar artery is patent, with normal flow related enhancement of the main branch vessels. Patent posterior cerebral arteries. No large vessel occlusion, high-grade stenosis,  aneurysm. ANATOMIC VARIANTS: Slightly fenestrated proximal RIGHT A1 segment. Source images and MIP images were reviewed. IMPRESSION: MRI HEAD: 1. No acute intracranial process, specifically no infarct. 2. 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 3. 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 MRA HEAD: 1. Normal MRA of the head. Electronically Signed   By: Elon Alas M.D.   On: 10/04/2016 01:37   Mr Jodene Nam Head Wo Contrast  Result Date: 10/04/2016 CLINICAL DATA:  Slurred speech, assess for stroke. Difficulty walking attributed to prior back surgery. History of subdural hematoma. EXAM: MRI HEAD WITHOUT CONTRAST MRA HEAD WITHOUT CONTRAST TECHNIQUE: Multiplanar, multiecho pulse sequences of the brain and surrounding structures were obtained without intravenous contrast. Angiographic images of the head were obtained using MRA technique without contrast. COMPARISON:  CT HEAD October 03, 2016 and MRI head February 14, 2008 and MRI cervical spine March 03, 2008 FINDINGS: MRI HEAD FINDINGS BRAIN: No reduced diffusion to suggest acute ischemia. No susceptibility artifact to suggest hemorrhage. The ventricles and sulci are normal for patient's age. Confluent pontine white matter FLAIR T2  hyperintensities, slightly increased from prior imaging. A few additional scattered subcentimeter supratentorial white matter FLAIR T2 hyperintensities. No suspicious parenchymal signal, mass or mass effect. Low low T1, mild homogeneously bright T2 LEFT holo hemispheric subdural fluid collection. VASCULAR: Normal major intracranial vascular flow voids present at skull base. SKULL AND UPPER CERVICAL SPINE: No abnormal sellar expansion. No suspicious calvarial bone marrow signal. Craniocervical junction maintained. Status post C3-4 ACDF. Cervical myelomalacia, present in 2009. SINUSES/ORBITS: Trace RIGHT mastoid effusion paranasal sinus are well aerated. The included ocular globes and orbital contents are non-suspicious. OTHER: Patient is edentulous. MRA HEAD FINDINGS ANTERIOR CIRCULATION: Normal flow related enhancement of the included cervical, petrous, cavernous and supraclinoid internal carotid arteries. Patent anterior communicating artery. Patent anterior and middle cerebral arteries, including distal segments. No large vessel occlusion, high-grade stenosis, aneurysm. POSTERIOR CIRCULATION: Codominant vertebral artery's. Basilar artery is patent, with normal flow related enhancement of the main branch vessels. Patent posterior cerebral arteries. No large vessel occlusion, high-grade stenosis,  aneurysm. ANATOMIC VARIANTS: Slightly fenestrated proximal RIGHT A1 segment. Source images and MIP images were reviewed. IMPRESSION: MRI HEAD: 1. No acute intracranial process, specifically no infarct. 2. 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 3. 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 MRA HEAD: 1. Normal MRA of the head. Electronically Signed   By: Elon Alas M.D.   On: 10/04/2016 01:37       Subjective: No headaches, dizziness, lightheadedness. No disorientation.  Discharge Exam: Vitals:   10/04/16 0600  10/04/16 0938  BP: 117/61 110/60  Pulse: (!) 59   Resp: 16 16  Temp: 98.2 F (36.8 C) 98 F (36.7 C)  SpO2: 96% 97%   Vitals:   10/04/16 0200 10/04/16 0359 10/04/16 0600 10/04/16 0938  BP:  132/66 118/60 117/61 110/60  Pulse: 60 70 (!) 59   Resp: 18 18 16 16   Temp: 98.3 F (36.8 C) 98.3 F (36.8 C) 98.2 F (36.8 C) 98 F (36.7 C)  TempSrc: Oral Oral Oral Oral  SpO2: 98% 98% 96% 97%  Weight: 51.1 kg (112 lb 10.5 oz)     Height: 5\' 8"  (1.727 m)       General: Pt is alert, awake, not in acute distress Cardiovascular: RRR, S1/S2 +, no rubs, no gallops Respiratory: CTA bilaterally, no wheezing, no rhonchi Abdominal: Soft, NT, ND, bowel sounds + Extremities: no edema, no cyanosis Neuro: alert, oriented, 5/5 strength on bilateral lower extremity and right upper extremity with 4/5 strength of left upper extremity. CN intact    The results of significant diagnostics from this hospitalization (including imaging, microbiology, ancillary and laboratory) are listed below for reference.     Microbiology: No results found for this or any previous visit (from the past 240 hour(s)).   Labs: BNP (last 3 results) No results for input(s): BNP in the last 8760 hours. Basic Metabolic Panel:  Recent Labs Lab 10/03/16 1810  NA 139  K 4.0  CL 107  CO2 24  GLUCOSE 171*  BUN 9  CREATININE 0.98  CALCIUM 8.8*   Liver Function Tests:  Recent Labs Lab 10/03/16 1810  AST 26  ALT 16*  ALKPHOS 80  BILITOT 0.5  PROT 5.9*  ALBUMIN 3.4*   No results for input(s): LIPASE, AMYLASE in the last 168 hours. No results for input(s): AMMONIA in the last 168 hours. CBC:  Recent Labs Lab 10/03/16 1810  WBC 8.3  NEUTROABS 6.1  HGB 11.5*  HCT 33.8*  MCV 99.7  PLT 249   Cardiac Enzymes: No results for input(s): CKTOTAL, CKMB, CKMBINDEX, TROPONINI in the last 168 hours. BNP: Invalid input(s): POCBNP CBG: No results for input(s): GLUCAP in the last 168 hours. D-Dimer No results  for input(s): DDIMER in the last 72 hours. Hgb A1c No results for input(s): HGBA1C in the last 72 hours. Lipid Profile  Recent Labs  10/04/16 0354  CHOL 104  HDL 36*  LDLCALC 58  TRIG 52  CHOLHDL 2.9   Thyroid function studies No results for input(s): TSH, T4TOTAL, T3FREE, THYROIDAB in the last 72 hours.  Invalid input(s): FREET3 Anemia work up No results for input(s): VITAMINB12, FOLATE, FERRITIN, TIBC, IRON, RETICCTPCT in the last 72 hours. Urinalysis    Component Value Date/Time   COLORURINE YELLOW 10/03/2016 [place] 10/03/2016 2338   LABSPEC 1.006 10/03/2016 2338   PHURINE 5.0 10/03/2016 [place] 10/03/2016 [place] 10/03/2016 [place] 10/03/2016 [place] 10/03/2016 2338   PROTEINUR NEGATIVE 10/03/2016 2338   UROBILINOGEN 0.2 08/20/2011 1933   NITRITE NEGATIVE 10/03/2016 [place] 10/03/2016 2338     SIGNED:   Cordelia Poche, MD [facility] 10/04/2016, 11:07 AM Pager [phone]  If 7PM-7AM, please contact night-coverage [email or web address] Password [email or web address]

## 2016-10-04 NOTE — Consult Note (Signed)
Reason for Consult: Subdural hygroma, hematoma Referring Physician: Dr. [name] is an 71 y.o. male.  HPI: Curtis Gentry is a 71 year old individual who came to the emergency department yesterday after experiencing slurring of his speech and some left-sided facial droop. 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 Patient has a moderate amount of atrophy. No evidence of an acute stroke as noted on the MRIs either. His symptoms have resolved at this time and he appears to be alert and lucid no evidence of facial weakness or speech difficulty is noted.  Past Medical History:  Diagnosis Date  . Anemia   . Anxiety   . Arthritis   . Erectile dysfunction   . Hyperlipidemia   . Peptic ulcer disease   . Perforated ulcer ([place])   . Unspecified disease of spinal cord    myelopahty, cervical spine    Past Surgical History:  Procedure Laterality Date  . BACK SURGERY  2011   3 level ACD with fusion and plating  . HEMORRHOID SURGERY    . PARTIAL GASTRECTOMY      Family History  Problem Relation Age of Onset  . Stroke Mother   . COPD Father        ?  Marland Kitchen Heart attack Father   . CAD Father   . Diabetes Unknown        family members  . CAD Unknown        family members  . Colon cancer Neg Hx   . Neuropathy Neg Hx     Social History:  reports that he has been smoking.  He has a 50.00 pack-year smoking history. He uses smokeless tobacco. He reports that he does not drink alcohol or use drugs.  Allergies: No Known Allergies  Medications: I have reviewed the patient's current medications.  Results for orders placed or performed during the hospital encounter of 10/03/16 (from the past 48 hour(s))  Protime-INR     Status: None   Collection Time:  10/03/16  6:10 PM  Result Value Ref Range   Prothrombin Time 15.2 11.4 - 15.2 seconds   INR 1.19   APTT     Status: Abnormal   Collection Time: 10/03/16  6:10 PM  Result Value Ref Range   aPTT 45 (H) 24 - 36 seconds    Comment:        IF BASELINE aPTT IS ELEVATED, SUGGEST PATIENT RISK ASSESSMENT BE USED TO DETERMINE APPROPRIATE ANTICOAGULANT THERAPY.   CBC     Status: Abnormal   Collection Time: 10/03/16  6:10 PM  Result Value Ref Range   WBC 8.3 4.0 - 10.5 K/uL   RBC 3.39 (L) 4.22 - 5.81 MIL/uL   Hemoglobin 11.5 (L) 13.0 - 17.0 g/dL   HCT 33.8 (L) 39.0 - 52.0 %   MCV 99.7 78.0 - 100.0 fL   MCH 33.9 26.0 - 34.0 pg   MCHC 34.0 30.0 - 36.0 g/dL   RDW 13.9 11.5 - 15.5 %   Platelets 249 150 - 400 K/uL  Differential     Status: None   Collection Time: 10/03/16  6:10 PM  Result Value Ref Range   Neutrophils Relative % 75 %   Neutro Abs 6.1 1.7 -  7.7 K/uL   Lymphocytes Relative 17 %   Lymphs Abs 1.4 0.7 - 4.0 K/uL   Monocytes Relative 7 %   Monocytes Absolute 0.6 0.1 - 1.0 K/uL   Eosinophils Relative 1 %   Eosinophils Absolute 0.1 0.0 - 0.7 K/uL   Basophils Relative 0 %   Basophils Absolute 0.0 0.0 - 0.1 K/uL  Comprehensive metabolic panel     Status: Abnormal   Collection Time: 10/03/16  6:10 PM  Result Value Ref Range   Sodium 139 135 - 145 mmol/L   Potassium 4.0 3.5 - 5.1 mmol/L   Chloride 107 101 - 111 mmol/L   CO2 24 22 - 32 mmol/L   Glucose, Bld 171 (H) 65 - 99 mg/dL   BUN 9 6 - 20 mg/dL   Creatinine, Ser 0.98 0.61 - 1.24 mg/dL   Calcium 8.8 (L) 8.9 - 10.3 mg/dL   Total Protein 5.9 (L) 6.5 - 8.1 g/dL   Albumin 3.4 (L) 3.5 - 5.0 g/dL   AST 26 15 - 41 U/L   ALT 16 (L) 17 - 63 U/L   Alkaline Phosphatase 80 38 - 126 U/L   Total Bilirubin 0.5 0.3 - 1.2 mg/dL   GFR calc non Af Amer >60 >60 mL/min   GFR calc Af Amer >60 >60 mL/min    Comment: (NOTE) The eGFR has been calculated using the CKD EPI equation. This calculation has not been validated in all clinical  situations. eGFR's persistently <60 mL/min signify possible Chronic Kidney Disease.    Anion gap 8 5 - 15  I-stat troponin, ED     Status: None   Collection Time: 10/03/16  6:30 PM  Result Value Ref Range   Troponin i, poc 0.00 0.00 - 0.08 ng/mL   Comment 3            Comment: Due to the release kinetics of cTnI, a negative result within the first hours of the onset of symptoms does not rule out myocardial infarction with certainty. If myocardial infarction is still suspected, repeat the test at appropriate intervals.   Urinalysis, Complete w Microscopic     Status: Abnormal   Collection Time: 10/03/16 11:38 PM  Result Value Ref Range   Color, Urine YELLOW YELLOW   APPearance CLEAR CLEAR   Specific Gravity, Urine 1.006 1.005 - 1.030   pH 5.0 5.0 - 8.0   Glucose, UA NEGATIVE NEGATIVE mg/dL   Hgb urine dipstick NEGATIVE NEGATIVE   Bilirubin Urine NEGATIVE NEGATIVE   Ketones, ur NEGATIVE NEGATIVE mg/dL   Protein, ur NEGATIVE NEGATIVE mg/dL   Nitrite NEGATIVE NEGATIVE   Leukocytes, UA NEGATIVE NEGATIVE   RBC / HPF NONE SEEN 0 - 5 RBC/hpf   WBC, UA 0-5 0 - 5 WBC/hpf   Bacteria, UA RARE (A) NONE SEEN   Squamous Epithelial / LPF NONE SEEN NONE SEEN  Lipid panel     Status: Abnormal   Collection Time: 10/04/16  3:54 AM  Result Value Ref Range   Cholesterol 104 0 - 200 mg/dL   Triglycerides 52 <150 mg/dL   HDL 36 (L) >40 mg/dL   Total CHOL/HDL Ratio 2.9 RATIO   VLDL 10 0 - 40 mg/dL   LDL Cholesterol 58 0 - 99 mg/dL    Comment:        Total Cholesterol/HDL:CHD Risk Coronary Heart Disease Risk Table                     Men  Women  1/2 Average Risk   3.4   3.3  Average Risk       5.0   4.4  2 X Average Risk   9.6   7.1  3 X Average Risk  23.4   11.0        Use the calculated Patient Ratio above and the CHD Risk Table to determine the patient's CHD Risk.        ATP III CLASSIFICATION (LDL):  <100     mg/dL   Optimal  100-129  mg/dL   Near or Above                     Optimal  130-159  mg/dL   Borderline  160-189  mg/dL   High  >190     mg/dL   Very High     Dg Chest 2 View  Result Date: 10/03/2016 CLINICAL DATA:  Shortness of breath EXAM: CHEST  2 VIEW COMPARISON:  Chest radiograph 08/20/2011 FINDINGS: Cardiomediastinal contours are normal. No pleural effusion or pneumothorax. Mild interstitial opacities diffusely. No focal consolidation. Lungs remain hyperexpanded. IMPRESSION: COPD without acute cardiopulmonary disease. Electronically Signed   By: Ulyses Jarred M.D.   On: 10/03/2016 19:08   Ct Head Wo Contrast  Result Date: 10/03/2016 CLINICAL DATA:  Left-sided facial droop with slurred speech EXAM: CT HEAD WITHOUT CONTRAST TECHNIQUE: Contiguous axial images were obtained from the base of the skull through the vertex without intravenous contrast. COMPARISON:  Head CT 01/14/2010 FINDINGS: Brain: There is a hypodense collection over the left parietal convexity measuring approximately 7 mm in thickness. No intraparenchymal hemorrhage. No CT evidence of acute cortical infarct. No midline shift or other mass effect. No hydrocephalus. There is periventricular hypoattenuation compatible with chronic microvascular disease. No evidence of acute cortical infarct. Vascular: No hyperdense vessel or unexpected calcification. Skull: Normal visualized skull base, calvarium and extracranial soft tissues. Sinuses/Orbits: No sinus fluid levels or advanced mucosal thickening. No mastoid effusion. Normal orbits. IMPRESSION: 1. 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 No evidence of acute hemorrhage. 2. No associated mass effect. 3. Chronic microvascular ischemia without CT evidence of acute infarct. Critical Value/emergent results were called by telephone at the time of interpretation on 10/03/2016 at 6:53 pm to Dr. Lajean Saver , who verbally acknowledged these results. Electronically Signed   By: Ulyses Jarred M.D.   On: 10/03/2016 18:54   Mr  Brain Wo Contrast  Result Date: 10/04/2016 CLINICAL DATA:  Slurred speech, assess for stroke. Difficulty walking attributed to prior back surgery. History of subdural hematoma. EXAM: MRI HEAD WITHOUT CONTRAST MRA HEAD WITHOUT CONTRAST TECHNIQUE: Multiplanar, multiecho pulse sequences of the brain and surrounding structures were obtained without intravenous contrast. Angiographic images of the head were obtained using MRA technique without contrast. COMPARISON:  CT HEAD October 03, 2016 and MRI head February 14, 2008 and MRI cervical spine March 03, 2008 FINDINGS: MRI HEAD FINDINGS BRAIN: No reduced diffusion to suggest acute ischemia. No susceptibility artifact to suggest hemorrhage. The ventricles and sulci are normal for patient's age. Confluent pontine white matter FLAIR T2 hyperintensities, slightly increased from prior imaging. A few additional scattered subcentimeter supratentorial white matter FLAIR T2 hyperintensities. No suspicious parenchymal signal, mass or mass effect. Low low T1, mild homogeneously bright T2 LEFT holo hemispheric subdural fluid collection. VASCULAR: Normal major intracranial vascular flow voids present at skull base. SKULL AND UPPER CERVICAL SPINE: No abnormal sellar expansion. No suspicious calvarial bone marrow  signal. Craniocervical junction maintained. Status post C3-4 ACDF. Cervical myelomalacia, present in 2009. SINUSES/ORBITS: Trace RIGHT mastoid effusion paranasal sinus are well aerated. The included ocular globes and orbital contents are non-suspicious. OTHER: Patient is edentulous. MRA HEAD FINDINGS ANTERIOR CIRCULATION: Normal flow related enhancement of the included cervical, petrous, cavernous and supraclinoid internal carotid arteries. Patent anterior communicating artery. Patent anterior and middle cerebral arteries, including distal segments. No large vessel occlusion, high-grade stenosis, aneurysm. POSTERIOR CIRCULATION: Codominant vertebral artery's. Basilar artery  is patent, with normal flow related enhancement of the main branch vessels. Patent posterior cerebral arteries. No large vessel occlusion, high-grade stenosis,  aneurysm. ANATOMIC VARIANTS: Slightly fenestrated proximal RIGHT A1 segment. Source images and MIP images were reviewed. IMPRESSION: MRI HEAD: 1. No acute intracranial process, specifically no infarct. 2. 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 3. 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 MRA HEAD: 1. Normal MRA of the head. Electronically Signed   By: Elon Alas M.D.   On: 10/04/2016 01:37   Mr Jodene Nam Head Wo Contrast  Result Date: 10/04/2016 CLINICAL DATA:  Slurred speech, assess for stroke. Difficulty walking attributed to prior back surgery. History of subdural hematoma. EXAM: MRI HEAD WITHOUT CONTRAST MRA HEAD WITHOUT CONTRAST TECHNIQUE: Multiplanar, multiecho pulse sequences of the brain and surrounding structures were obtained without intravenous contrast. Angiographic images of the head were obtained using MRA technique without contrast. COMPARISON:  CT HEAD October 03, 2016 and MRI head February 14, 2008 and MRI cervical spine March 03, 2008 FINDINGS: MRI HEAD FINDINGS BRAIN: No reduced diffusion to suggest acute ischemia. No susceptibility artifact to suggest hemorrhage. The ventricles and sulci are normal for patient's age. Confluent pontine white matter FLAIR T2 hyperintensities, slightly increased from prior imaging. A few additional scattered subcentimeter supratentorial white matter FLAIR T2 hyperintensities. No suspicious parenchymal signal, mass or mass effect. Low low T1, mild homogeneously bright T2 LEFT holo hemispheric subdural fluid collection. VASCULAR: Normal major intracranial vascular flow voids present at skull base. SKULL AND UPPER CERVICAL SPINE: No abnormal sellar expansion. No suspicious calvarial bone marrow signal.  Craniocervical junction maintained. Status post C3-4 ACDF. Cervical myelomalacia, present in 2009. SINUSES/ORBITS: Trace RIGHT mastoid effusion paranasal sinus are well aerated. The included ocular globes and orbital contents are non-suspicious. OTHER: Patient is edentulous. MRA HEAD FINDINGS ANTERIOR CIRCULATION: Normal flow related enhancement of the included cervical, petrous, cavernous and supraclinoid internal carotid arteries. Patent anterior communicating artery. Patent anterior and middle cerebral arteries, including distal segments. No large vessel occlusion, high-grade stenosis, aneurysm. POSTERIOR CIRCULATION: Codominant vertebral artery's. Basilar artery is patent, with normal flow related enhancement of the main branch vessels. Patent posterior cerebral arteries. No large vessel occlusion, high-grade stenosis,  aneurysm. ANATOMIC VARIANTS: Slightly fenestrated proximal RIGHT A1 segment. Source images and MIP images were reviewed. IMPRESSION: MRI HEAD: 1. No acute intracranial process, specifically no infarct. 2. 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 3. 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 MRA HEAD: 1. Normal MRA of the head. Electronically Signed   By: Elon Alas M.D.   On: 10/04/2016 01:37    Review of Systems  Constitutional: Negative.   HENT: Negative.   Eyes: Negative.   Respiratory: Positive for shortness of breath.   Cardiovascular: Negative.   Gastrointestinal: Negative.   Genitourinary: Negative.   Musculoskeletal: Negative.   Skin: Negative.   Neurological: Positive for speech  change and focal weakness.  Endo/Heme/Allergies: Negative.   Psychiatric/Behavioral: Negative.    Blood pressure 110/60, pulse (!) 59, temperature 98 F (36.7 C), temperature source Oral, resp. rate 16, height _0  (1.727 m), weight 51.1 kg (112 lb 10.5 oz), SpO2 97 %. Physical Exam   Constitutional: He is oriented to person, place, and time. He appears well-developed and well-nourished.  HENT:  Head: Normocephalic and atraumatic.  Eyes: Pupils are equal, round, and reactive to light. Conjunctivae and EOM are normal.  Neck: Normal range of motion. Neck supple.  GI: Soft. Bowel sounds are normal.  Musculoskeletal: Normal range of motion.  Neurological: He is alert and oriented to person, place, and time. He has normal reflexes.  Moves all 4 extremities well station and gait are intact.  Skin: Skin is warm and dry.  Psychiatric: He has a normal mood and affect. His behavior is normal. Judgment and thought content normal.    Assessment/Plan: Patient has a small extra-axial fluid collection on the left side which is not causing any shift or mass effect he has some generalized atrophy and the hygroma or hematoma may be a result of extraocular changes. I do not believe that the presence of the fluid explains his transient symptoms. I do not believe any surgical intervention is warranted for this process nor do I feel any specific follow-up is necessary from neurosurgical standpoint.  Alyzabeth Pontillo J 10/04/2016, 10:47 AM

## 2016-10-05 LAB — HEMOGLOBIN A1C
HEMOGLOBIN A1C: 5.8 % — AB (ref 4.8–5.6)
MEAN PLASMA GLUCOSE: 120 mg/dL

## 2016-10-16 DIAGNOSIS — I6200 Nontraumatic subdural hemorrhage, unspecified: Secondary | ICD-10-CM | POA: Diagnosis not present

## 2016-10-16 DIAGNOSIS — I959 Hypotension, unspecified: Secondary | ICD-10-CM | POA: Diagnosis not present

## 2016-10-16 DIAGNOSIS — G459 Transient cerebral ischemic attack, unspecified: Secondary | ICD-10-CM | POA: Diagnosis not present

## 2016-10-30 DIAGNOSIS — L309 Dermatitis, unspecified: Secondary | ICD-10-CM | POA: Diagnosis not present

## 2016-10-30 DIAGNOSIS — Z681 Body mass index (BMI) 19 or less, adult: Secondary | ICD-10-CM | POA: Diagnosis not present

## 2016-10-30 DIAGNOSIS — M1990 Unspecified osteoarthritis, unspecified site: Secondary | ICD-10-CM | POA: Diagnosis not present

## 2016-10-30 DIAGNOSIS — I6200 Nontraumatic subdural hemorrhage, unspecified: Secondary | ICD-10-CM | POA: Diagnosis not present

## 2016-10-30 DIAGNOSIS — I959 Hypotension, unspecified: Secondary | ICD-10-CM | POA: Diagnosis not present

## 2016-10-30 DIAGNOSIS — G629 Polyneuropathy, unspecified: Secondary | ICD-10-CM | POA: Diagnosis not present

## 2016-10-30 DIAGNOSIS — J449 Chronic obstructive pulmonary disease, unspecified: Secondary | ICD-10-CM | POA: Diagnosis not present

## 2016-11-03 ENCOUNTER — Ambulatory Visit: Payer: Medicare HMO | Admitting: Physical Therapy

## 2016-11-11 ENCOUNTER — Ambulatory Visit: Payer: Medicare HMO | Attending: Family Medicine | Admitting: Physical Therapy

## 2016-11-11 DIAGNOSIS — R2689 Other abnormalities of gait and mobility: Secondary | ICD-10-CM | POA: Diagnosis present

## 2016-11-11 DIAGNOSIS — M6281 Muscle weakness (generalized): Secondary | ICD-10-CM | POA: Diagnosis present

## 2016-11-12 NOTE — Therapy (Signed)
[facility] [address] [place], [place], [address] Phone: [phone]   Fax:  [phone]  Physical Therapy Evaluation  Patient Details  Name: Curtis Gentry MRN: [id number] Date of Birth: 1945-06-07 Referring Provider: Cordelia Poche (Dr. Dagmar Hait to follow)  Encounter Date: 11/11/2016      PT End of Session - 11/12/16 0928    Visit Number 1   Number of Visits 1  one time eval, as pt's daughter request no treatment due to financial concerns   Authorization Type [employer]   PT Start Time 0932   PT Stop Time 1018   PT Time Calculation (min) 46 min   Activity Tolerance Patient tolerated treatment well   Behavior During Therapy [facility] for tasks assessed/performed      Past Medical History:  Diagnosis Date  . Anemia   . Anxiety   . Arthritis   . Erectile dysfunction   . Hyperlipidemia   . Peptic ulcer disease   . Perforated ulcer ([place])   . Unspecified disease of spinal cord    myelopahty, cervical spine    Past Surgical History:  Procedure Laterality Date  . BACK SURGERY  2011   3 level ACD with fusion and plating  . HEMORRHOID SURGERY    . PARTIAL GASTRECTOMY      There were no vitals filed for this visit.       Subjective Assessment - 11/11/16 0936    Subjective Pt reports if he's standing in line for too long, I pass out.  My eyes get blurry and the next thing I know I'm on the floor.  Daughter reports having leg tremors in the past several months and he reports having a lot of cramping in feet and legs.  Pt reports 2-3 falls in the past 6 months.  Pt reports no changes in balance specifically.   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 Yes  typical pain/tightness in arms   Pain  Score 3    Pain Location Arm   Pain Orientation Right;Left   Pain Descriptors / Indicators Tightness;Numbness   Pain Type Chronic pain   Pain Onset More than a month ago  since back surgery 6-7 years ago-PT will not address due to chronic in nature   Pain Frequency Constant   Aggravating Factors  Nothing   Pain Relieving Factors Nothing            [facility] PT Assessment - 11/11/16 0944      Assessment   Medical Diagnosis ataxia   Referring Provider Cordelia Poche  Dr. Dagmar Hait to follow   Onset Date/Surgical Date 10/04/16     Precautions   Precautions Fall   Precaution Comments History of ataxia from accident at work >20 years ago     Balance Screen   Has the patient fallen in the past 6 months Yes   How many times? 3   Has the patient had a decrease in activity level because of a fear of falling?  Yes   Is the patient reluctant to leave their home because of a fear of falling?  Yes     [place] residence   Living Arrangements Alone   Available Help at Discharge Family   Type of [place]  Home Access Stairs to enter   Entrance Stairs-Number of Steps 5   Entrance Stairs-Rails Right;Left   Home Layout One level   [place] - 2 wheels;Cane - single point;Grab bars - toilet;Grab bars - tub/shower;Shower seat     Prior Function   Level of Independence Independent   Leisure Performs household tasks, cooking; enjoys walking and working on things, but has slowed doing this     Observation/Other Assessments   Focus on Therapeutic Outcomes (FOTO)  ABC scale score:  0% (daughter reports patient filled it out and said 0 to everything due to increased fear of falling)     ROM / Strength   AROM / PROM / Strength Strength     Strength   Overall Strength Comments Grossly tested RLE 4+/5, LLE 4/5 with L ankle dorsiflexion 3+/5     Transfers   Transfers Sit to Stand;Stand to Sit   Sit to Stand 6: Modified independent  (Device/Increase time);With upper extremity assist;From chair/3-in-1   Stand to Sit 6: Modified independent (Device/Increase time);With upper extremity assist;To chair/3-in-1     Ambulation/Gait   Ambulation/Gait Yes   Ambulation/Gait Assistance 5: Supervision   Ambulation/Gait Assistance Details Daughter reports ataxic gait pattern is near baseline for patient   Ambulation Distance (Feet) 120 Feet   Assistive device None   Gait Pattern Step-through pattern;Decreased arm swing - right;Decreased arm swing - left;Wide base of support;Poor foot clearance - left;Poor foot clearance - right;Decreased trunk rotation   Ambulation Surface Level;Indoor   Gait velocity 14.59 sec = 2.25 ft/sec     Standardized Balance Assessment   Standardized Balance Assessment Timed Up and Go Test     Timed Up and Go Test   Normal TUG (seconds) 11.6            Objective measurements completed on examination: See above findings.                  PT Education - 11/12/16 [phone]    Education provided Yes   Education Details Based on daughter's reports of lowering blood pressures with position changes and syncopal episodes with falls in standing-educated patient in slowed transitions when changing positions and performing ankle pumps, marching, LAQ after prolonged sitting prior to standing, performing standing weightshfiting or toe taps to aid in circulation with prolonged standing; discussed POC and plans to address strength with HEP   Person(s) Educated Patient;Child(ren)   Methods Explanation   Comprehension Verbalized understanding                     Plan - 11/12/16 1228    Clinical Impression Statement Pt is a 71 year old male who presents to OP PT with abnormality of gait.  Pt has history of ataxia, but has had several falls in the past 6 months due to syncopal episodes.  Pt presents to OP PT with decreased strength, decreased balance, decreased balance confidence (reported as  0% on ABC scale score).  PT recommends that patient would benefit from skilled PT to address the above stated deficits, to improve functional mobility and improve balance confidence.  However, after eval completed and recommendation made for 2x/wk for 4 weeks, prior to completion of eval write-up, patient/daughter request no follow-up appoinments be made due to financial concerns.  Therefore, no goals set as patient plans to not return to therapy.   History and Personal Factors relevant to plan of care: recent fall history, >3 co-morbidities, lives alone with family nearby  Clinical Presentation Stable   Clinical Presentation due to: history of falls   Clinical Decision Making Low   Rehab Potential Good   PT Frequency --  PT recommended 2x/wk for 4 weeks-pt/family did not want to schedule beyond eval   PT Treatment/Interventions ADLs/Self Care Home Management;Gait training;Functional mobility training;Therapeutic activities;Therapeutic exercise;Balance training;Neuromuscular re-education;Patient/family education   PT Next Visit Plan Recommended intitiating HEP with [facility] with plans to help transition to community fitness; however, no further visits scheduled per pt request   Consulted and Agree with Plan of Care Patient;Family member/caregiver      Patient will benefit from skilled therapeutic intervention in order to improve the following deficits and impairments:  Abnormal gait, Decreased balance, Decreased mobility, Decreased strength, Difficulty walking  Visit Diagnosis: Other abnormalities of gait and mobility  Muscle weakness (generalized)      G-Codes - 2016/12/05 1240    Functional Assessment Tool Used (Outpatient Only) 2.25 ft/sec, 11.6 sec for TUG score; ABC scale score 0% (pt reported on FOTO)   Functional Limitation Mobility: Walking and moving around   Mobility: Walking and Moving Around Current Status [phone]) At least 40 percent but less than 60 percent impaired, limited or  restricted   Mobility: Walking and Moving Around Goal Status [phone]) At least 40 percent but less than 60 percent impaired, limited or restricted   Mobility: Walking and Moving Around Discharge Status [phone]) At least 40 percent but less than 60 percent impaired, limited or restricted       Problem List Patient Active Problem List   Diagnosis Date Noted  . Tobacco abuse 10/03/2016  . BPH (benign prostatic hyperplasia) 10/03/2016  . SDH (subdural hematoma) ([place]) 10/03/2016  . Slurred speech 10/03/2016  . Hyperlipidemia   . Anxiety   . 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 Abnormality of gait 05/03/2015  . Hereditary and idiopathic peripheral neuropathy 05/03/2015  . Syncope and collapse 08/20/2011  . ANEMIA-UNSPECIFIED 09/06/2008  . DYSPHAGIA 09/06/2008    MARRIOTT,AMY W. 11/12/2016, 12:41 PM Frazier Butt., PT  [facility] [address] [place], [place], [address] Phone: [phone]   Fax:  [phone]  Name: KIMON LOEWEN MRN: [id number] Date of Birth: 03-08-1945

## 2016-11-24 ENCOUNTER — Ambulatory Visit: Payer: Medicare HMO | Admitting: Physical Therapy

## 2016-11-27 ENCOUNTER — Ambulatory Visit: Payer: Medicare HMO | Admitting: Physical Therapy

## 2016-12-02 ENCOUNTER — Ambulatory Visit: Payer: Medicare HMO | Admitting: Physical Therapy

## 2016-12-04 ENCOUNTER — Ambulatory Visit: Payer: Medicare HMO | Admitting: Physical Therapy

## 2016-12-08 ENCOUNTER — Ambulatory Visit: Payer: Medicare HMO | Admitting: Physical Therapy

## 2016-12-10 ENCOUNTER — Ambulatory Visit: Payer: Medicare HMO | Admitting: Physical Therapy

## 2016-12-17 ENCOUNTER — Ambulatory Visit: Payer: Medicare HMO | Admitting: Physical Therapy

## 2016-12-19 ENCOUNTER — Ambulatory Visit: Payer: Medicare HMO | Admitting: Physical Therapy

## 2016-12-29 DIAGNOSIS — D225 Melanocytic nevi of trunk: Secondary | ICD-10-CM | POA: Diagnosis not present

## 2016-12-29 DIAGNOSIS — B352 Tinea manuum: Secondary | ICD-10-CM | POA: Diagnosis not present

## 2016-12-29 DIAGNOSIS — L821 Other seborrheic keratosis: Secondary | ICD-10-CM | POA: Diagnosis not present

## 2017-01-07 ENCOUNTER — Other Ambulatory Visit: Payer: Self-pay

## 2017-01-07 ENCOUNTER — Emergency Department (HOSPITAL_COMMUNITY): Payer: Medicare HMO

## 2017-01-07 ENCOUNTER — Emergency Department (HOSPITAL_COMMUNITY)
Admission: EM | Admit: 2017-01-07 | Discharge: 2017-01-08 | Disposition: A | Discharge status: Home / Self Care | Payer: Medicare HMO | Attending: Emergency Medicine | Admitting: Emergency Medicine

## 2017-01-07 ENCOUNTER — Encounter (HOSPITAL_COMMUNITY): Payer: Self-pay | Admitting: Emergency Medicine

## 2017-01-07 DIAGNOSIS — Y998 Other external cause status: Secondary | ICD-10-CM | POA: Insufficient documentation

## 2017-01-07 DIAGNOSIS — R109 Unspecified abdominal pain: Secondary | ICD-10-CM | POA: Diagnosis not present

## 2017-01-07 DIAGNOSIS — Y9389 Activity, other specified: Secondary | ICD-10-CM | POA: Diagnosis not present

## 2017-01-07 DIAGNOSIS — Y92524 Gas station as the place of occurrence of the external cause: Secondary | ICD-10-CM | POA: Diagnosis not present

## 2017-01-07 DIAGNOSIS — F419 Anxiety disorder, unspecified: Secondary | ICD-10-CM | POA: Insufficient documentation

## 2017-01-07 DIAGNOSIS — Z79899 Other long term (current) drug therapy: Secondary | ICD-10-CM | POA: Insufficient documentation

## 2017-01-07 DIAGNOSIS — X58XXXA Exposure to other specified factors, initial encounter: Secondary | ICD-10-CM | POA: Insufficient documentation

## 2017-01-07 DIAGNOSIS — F17200 Nicotine dependence, unspecified, uncomplicated: Secondary | ICD-10-CM | POA: Diagnosis not present

## 2017-01-07 DIAGNOSIS — R69 Illness, unspecified: Secondary | ICD-10-CM | POA: Diagnosis not present

## 2017-01-07 DIAGNOSIS — S39011A Strain of muscle, fascia and tendon of abdomen, initial encounter: Secondary | ICD-10-CM | POA: Diagnosis not present

## 2017-01-07 DIAGNOSIS — Z791 Long term (current) use of non-steroidal anti-inflammatories (NSAID): Secondary | ICD-10-CM | POA: Diagnosis not present

## 2017-01-07 DIAGNOSIS — Z7982 Long term (current) use of aspirin: Secondary | ICD-10-CM | POA: Insufficient documentation

## 2017-01-07 DIAGNOSIS — S29012A Strain of muscle and tendon of back wall of thorax, initial encounter: Secondary | ICD-10-CM | POA: Diagnosis not present

## 2017-01-07 DIAGNOSIS — R0781 Pleurodynia: Secondary | ICD-10-CM | POA: Diagnosis not present

## 2017-01-07 DIAGNOSIS — S3991XA Unspecified injury of abdomen, initial encounter: Secondary | ICD-10-CM | POA: Diagnosis present

## 2017-01-07 DIAGNOSIS — T148XXA Other injury of unspecified body region, initial encounter: Secondary | ICD-10-CM

## 2017-01-07 LAB — URINALYSIS, ROUTINE W REFLEX MICROSCOPIC
Bilirubin Urine: NEGATIVE
GLUCOSE, UA: NEGATIVE mg/dL
Hgb urine dipstick: NEGATIVE
KETONES UR: NEGATIVE mg/dL
Leukocytes, UA: NEGATIVE
NITRITE: NEGATIVE
PH: 7 (ref 5.0–8.0)
Protein, ur: NEGATIVE mg/dL
SPECIFIC GRAVITY, URINE: 1.008 (ref 1.005–1.030)

## 2017-01-07 MED ORDER — HYDROCODONE-ACETAMINOPHEN 5-325 MG PO TABS
1.0000 | ORAL_TABLET | Freq: Once | ORAL | Status: AC
  Administered 2017-01-07: 1 via ORAL
  Filled 2017-01-07: qty 1

## 2017-01-07 NOTE — ED Triage Notes (Addendum)
Pt states he started having right flank pain when he stepped out of his truck yesterday. Denies falling or any injury. Denies any urinary symptoms

## 2017-01-08 DIAGNOSIS — Z791 Long term (current) use of non-steroidal anti-inflammatories (NSAID): Secondary | ICD-10-CM | POA: Diagnosis not present

## 2017-01-08 DIAGNOSIS — S39011A Strain of muscle, fascia and tendon of abdomen, initial encounter: Secondary | ICD-10-CM | POA: Diagnosis not present

## 2017-01-08 DIAGNOSIS — X58XXXA Exposure to other specified factors, initial encounter: Secondary | ICD-10-CM | POA: Diagnosis not present

## 2017-01-08 DIAGNOSIS — Z79899 Other long term (current) drug therapy: Secondary | ICD-10-CM | POA: Diagnosis not present

## 2017-01-08 DIAGNOSIS — R69 Illness, unspecified: Secondary | ICD-10-CM | POA: Diagnosis not present

## 2017-01-08 DIAGNOSIS — Z7982 Long term (current) use of aspirin: Secondary | ICD-10-CM | POA: Diagnosis not present

## 2017-01-08 LAB — CBC WITH DIFFERENTIAL/PLATELET
BASOS ABS: 0.1 10*3/uL (ref 0.0–0.1)
BASOS PCT: 1 %
Eosinophils Absolute: 0.2 10*3/uL (ref 0.0–0.7)
Eosinophils Relative: 2 %
HCT: 37 % — ABNORMAL LOW (ref 39.0–52.0)
Hemoglobin: 12.4 g/dL — ABNORMAL LOW (ref 13.0–17.0)
LYMPHS ABS: 2.3 10*3/uL (ref 0.7–4.0)
LYMPHS PCT: 30 %
MCH: 34.3 pg — ABNORMAL HIGH (ref 26.0–34.0)
MCHC: 33.5 g/dL (ref 30.0–36.0)
MCV: 102.5 fL — ABNORMAL HIGH (ref 78.0–100.0)
MONO ABS: 0.7 10*3/uL (ref 0.1–1.0)
MONOS PCT: 9 %
NEUTROS ABS: 4.4 10*3/uL (ref 1.7–7.7)
NEUTROS PCT: 58 %
PLATELETS: 237 10*3/uL (ref 150–400)
RBC: 3.61 MIL/uL — AB (ref 4.22–5.81)
RDW: 14.2 % (ref 11.5–15.5)
WBC: 7.7 10*3/uL (ref 4.0–10.5)

## 2017-01-08 LAB — BASIC METABOLIC PANEL
ANION GAP: 7 (ref 5–15)
BUN: 7 mg/dL (ref 6–20)
CALCIUM: 8.8 mg/dL — AB (ref 8.9–10.3)
CHLORIDE: 105 mmol/L (ref 101–111)
CO2: 27 mmol/L (ref 22–32)
Creatinine, Ser: 0.82 mg/dL (ref 0.61–1.24)
GFR calc non Af Amer: >60 mL/min (ref >60)
GLUCOSE: 112 mg/dL — AB (ref 65–99)
POTASSIUM: 3.9 mmol/L (ref 3.5–5.1)
Sodium: 139 mmol/L (ref 135–145)

## 2017-01-08 MED ORDER — TIZANIDINE HCL 2 MG PO CAPS: 2.0000 mg | ORAL_CAPSULE | Freq: Three times a day (TID) | ORAL | 0 refills | Status: AC

## 2017-01-08 MED ORDER — TIZANIDINE HCL 4 MG PO TABS
2.0000 mg | ORAL_TABLET | Freq: Once | ORAL | Status: AC
Start: 2017-01-08 — End: 2017-01-08
  Administered 2017-01-08: 2 mg via ORAL
  Filled 2017-01-08: qty 1

## 2017-01-08 MED ORDER — DICLOFENAC SODIUM 1 % TD GEL: 2.0000 g | Freq: Four times a day (QID) | TRANSDERMAL | 0 refills | Status: AC

## 2017-01-08 NOTE — ED Provider Notes (Signed)
[facility] EMERGENCY DEPARTMENT Provider Note   CSN: [id number] Arrival date & time: 01/07/17  1729     History   Chief Complaint Chief Complaint  Patient presents with  . Flank Pain    HPI Curtis Gentry is a 71 y.o. male 2 days of right sided flank pain that began yesterday.  Patient reports that he was getting out of his truck to get gas when he felt pain on the right side flank.  Patient reports that pain is been constant.  He reports it is worse in with movement and bending.  He has been taking his regular pain medication, including one Vicodin and gabapentin with minimal improvements in symptoms.  He has not taken any other medications for pain relief.  Patient reports that he still been able to ambulate without difficulty.  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  He denies any new or changes in numbness. Patient denies any fevers, chest pain, S OB, abdominal pain, nausea/vomiting, history of back surgery, history of IV drug use, weight loss, urinary or bowel incontinence, dysuria, hematuria, saddle anesthesia.  The history is provided by the patient and a relative.    Past Medical History:  Diagnosis Date  . Anemia   . Anxiety   . Arthritis   . Erectile dysfunction   . Hyperlipidemia   . Peptic ulcer disease   . Perforated ulcer ([place])   . Unspecified disease of spinal cord    myelopahty, cervical spine    Patient Active Problem List   Diagnosis Date Noted  . Tobacco abuse 10/03/2016  . BPH (benign prostatic hyperplasia) 10/03/2016  . SDH (subdural hematoma) ([place]) 10/03/2016  . Slurred speech 10/03/2016  . Hyperlipidemia   . Anxiety   . 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 Abnormality of gait 05/03/2015  . Hereditary and idiopathic peripheral neuropathy 05/03/2015  . Syncope and collapse 08/20/2011  . ANEMIA-UNSPECIFIED 09/06/2008  . DYSPHAGIA 09/06/2008     Past Surgical History:  Procedure Laterality Date  . BACK SURGERY  2011   3 level ACD with fusion and plating  . HEMORRHOID SURGERY    . PARTIAL GASTRECTOMY         Home Medications    Prior to Admission medications   Medication Sig Start Date End Date Taking? Authorizing Provider  aspirin EC 81 MG tablet Take 81 mg by mouth daily.   Yes [provider]  atorvastatin (LIPITOR) 10 MG tablet Take 10 mg by mouth at bedtime.    Yes [provider]  Cholecalciferol (VITAMIN D) 2000 units tablet Take 2,000 Units by mouth daily.   Yes [provider]  Cyanocobalamin (VITAMIN B-12 PO) Take 1 tablet daily by mouth.   Yes [provider]  diazepam (VALIUM) 5 MG tablet Take 5 mg as needed by mouth (EVERY 8-12 HOURS AS NEEDED FOR ANXIETY).  04/29/16  Yes [provider]  escitalopram (LEXAPRO) 5 MG tablet Take 5 mg daily by mouth.   Yes [provider]  gabapentin (NEURONTIN) 300 MG capsule Take 300 mg daily as needed by mouth (nerve pain).    Yes [provider]  HYDROcodone-acetaminophen (NORCO/VICODIN) 5-325 MG per tablet Take 1 tablet See admin instructions by mouth. EVERY 4-6 HOURS AS NEEDED FOR PAIN 02/23/14  Yes [provider]  ibuprofen (ADVIL,MOTRIN) 200 MG tablet Take 200 mg at bedtime by mouth.   Yes [provider]  Multiple Vitamin (MULTIVITAMIN  WITH MINERALS) TABS tablet Take 1 tablet by mouth daily.   Yes [provider]  Omega-3 Fatty Acids (FISH OIL PO) Take 1,200 mg daily by mouth.    Yes [provider]  tamsulosin (FLOMAX) 0.4 MG CAPS capsule Take 0.4 mg by mouth daily after supper.  06/25/16  Yes [provider]  terbinafine (LAMISIL) 250 MG tablet Take 250 mg daily by mouth. 12/31/16  Yes [provider]  diclofenac sodium (VOLTAREN) 1 % GEL Apply 2 g 4 (four) times daily topically. 01/08/17   Volanda Napoleon, PA-C  senna-docusate (SENOKOT-S) 8.6-50 MG tablet  Take 1 tablet by mouth at bedtime as needed for mild constipation. 10/04/16   Mariel Aloe, MD  tizanidine (ZANAFLEX) 2 MG capsule Take 1 capsule (2 mg total) 3 (three) times daily for 5 days by mouth. 01/08/17 01/13/17  Volanda Napoleon, PA-C    Family History Family History  Problem Relation Age of Onset  . Stroke Mother   . COPD Father        ?  Marland Kitchen Heart attack Father   . CAD Father   . Diabetes Unknown        family members  . CAD Unknown        family members  . Colon cancer Neg Hx   . Neuropathy Neg Hx     Social History Social History   Tobacco Use  . Smoking status: Current Every Day Smoker    Packs/day: 1.00    Years: 50.00    Pack years: 50.00  . Smokeless tobacco: Current User  Substance Use Topics  . Alcohol use: No    Comment: 2-4 ounces alcohol per day  . Drug use: No     Allergies   Patient has no known allergies.   Review of Systems Review of Systems  Constitutional: Negative for fever and unexpected weight change.  Respiratory: Negative for shortness of breath.   Cardiovascular: Negative for chest pain.  Gastrointestinal: Negative for abdominal pain, nausea and vomiting.  Genitourinary: Positive for flank pain. Negative for dysuria and hematuria.  Musculoskeletal: Negative for back pain and neck pain.  Neurological: Negative for weakness and numbness.     Physical Exam Updated Vital Signs BP 122/72 (BP Location: Right Arm)   Pulse 63   Temp 97.8 F (36.6 C) (Oral)   Resp 14   Ht 5\' 8"  (1.727 m)   SpO2 99%   BMI 17.13 kg/m   Physical Exam  Constitutional: He is oriented to person, place, and time. He appears well-developed and well-nourished.  Elderly appearing  HENT:  Head: Normocephalic and atraumatic.  Mouth/Throat: Oropharynx is clear and moist and mucous membranes are normal.  Eyes: Conjunctivae, EOM and lids are normal. Pupils are equal, round, and reactive to light.  Neck: Full passive range of motion without pain.  Full  flexion/extension and lateral movement of neck fully intact. No bony midline tenderness. No deformities or crepitus.   Cardiovascular: Normal rate, regular rhythm, normal heart sounds and normal pulses. Exam reveals no gallop and no friction rub.  No murmur heard. Pulses:      Radial pulses are 2+ on the right side, and 2+ on the left side.       Dorsalis pedis pulses are 2+ on the right side, and 2+ on the left side.  Pulmonary/Chest: Effort normal and breath sounds normal.  Abdominal: Soft. Normal appearance. There is no tenderness. There is no rigidity and no guarding.  Musculoskeletal: Normal  range of motion.       Thoracic back: He exhibits no tenderness.       Lumbar back: He exhibits no tenderness.       Back:  Diffuse tenderness palpation to the right flank that extends slightly up towards the posterior aspect of the ribs.  No deformity or crepitus noted..  No overlying warmth, erythema, ecchymosis.  No midline T or L-spine tenderness.  Flexion/extension of the back intact without difficulty.  Neurological: He is alert and oriented to person, place, and time.  Follows commands, Moves all extremities  5/5 strength to BUE and BLE  Sensation intact throughout all major nerve distributions Normal gait  Skin: Skin is warm and dry. Capillary refill takes less than 2 seconds.  Good distal cap refill to bilateral lower extremities.  Psychiatric: He has a normal mood and affect. His speech is normal.  Nursing note and vitals reviewed.    ED Treatments / Results  Labs (all labs ordered are listed, but only abnormal results are displayed) Labs Reviewed  BASIC METABOLIC PANEL - Abnormal; Notable for the following components:      Result Value   Glucose, Bld 112 (*)    Calcium 8.8 (*)    All other components within normal limits  CBC WITH DIFFERENTIAL/PLATELET - Abnormal; Notable for the following components:   RBC 3.61 (*)    Hemoglobin 12.4 (*)    HCT 37.0 (*)    MCV 102.5 (*)     MCH 34.3 (*)    All other components within normal limits  URINALYSIS, ROUTINE W REFLEX MICROSCOPIC    EKG  EKG Interpretation None       Radiology Dg Chest 2 View  Result Date: 01/07/2017 CLINICAL DATA:  Flank pain EXAM: CHEST  2 VIEW COMPARISON:  10/03/2016 FINDINGS: Hyperinflation with bibasilar scarring. No acute consolidation or effusion. Stable cardiomediastinal silhouette with atherosclerosis. No pneumothorax. Postsurgical changes of the cervical spine. Surgical clips at the GE junction. IMPRESSION: Hyperinflation with emphysematous changes and scarring at the bases. No acute pulmonary infiltrate is seen. Electronically Signed   By: Donavan Foil M.D.   On: 01/07/2017 23:18    Procedures Procedures (including critical care time)  Medications Ordered in ED Medications  HYDROcodone-acetaminophen (NORCO/VICODIN) 5-325 MG per tablet 1 tablet (1 tablet Oral Given 01/07/17 2358)  tiZANidine (ZANAFLEX) tablet 2 mg (2 mg Oral Given 01/08/17 0120)     Initial Impression / Assessment and Plan / ED Course  I have reviewed the triage vital signs and the nursing notes.  Pertinent labs & imaging results that were available during my care of the patient were reviewed by me and considered in my medical decision making (see chart for details).  Clinical Course as of Jan 08 1145  Thu Jan 08, 2017  0051 Pulse Rate: 63 [LL]    Clinical Course User Index [LL] Volanda Napoleon, PA-C    71 y.o. M who presents with right-sided flank pain that began yesterday.  No fevers, urinary complaints.  Not relieved by one Vicodin and gabapentin that patient normally takes.  Patient is afebrile, non-toxic appearing, sitting comfortably on examination table. Vital signs reviewed and stable. Patient is slightly hypertensive, likely secondary to pain.  We will plan to treat pain and reassess vitals.  Consider muscle strain.  Also consider kidney stone though low suspicion given history/physical exam.   History/physical exam are not concerning for cauda equina or spinal abscess.  Patient's pain is reproducible, he has equal pulses  in all 4 extremities and no complaint of numbness/weakness.  Do not suspect aortic dissection.  We will plan to obtain basic labs including UA, CBC, BMP.  Given that pain extends slightly over to the posterior aspect of the ribs, will plan to obtain chest x-ray.  Patient has no midline L-spine tenderness.  No deformity or crepitus noted.  No indication for lumbar x-ray at this time.  Analgesics provided in the department. Discussed patient with Dr. Lita Mains who agrees with pain.   Labs and imaging reviewed.  Chest x-ray shows changes consistent with emphysema.  Otherwise no acute abnormalities.  UA is unremarkable.  No signs of infection.  No hemoglobin that would indicate kidney stone.  CBC shows anemia.  Record to do so that this is consistent with patient's previous.  BMP shows no elevation in BUN/creatinine.  Otherwise unremarkable.  Discussed results with patient.  He reports some improvement in pain after pain medication but states it is still present.  Most notably, when moving torso.  Patient able to Ambulate in the department without any difficulty.  .  Will plan to treat as muscle strain.   Instructed patient to follow-up with his primary care doctor in the next 24-48 hours for further evaluation. Patient had ample opportunity for questions and discussion. All patient's questions were answered with full understanding. Strict return precautions discussed. Patient expresses understanding and agreement to plan.    Final Clinical Impressions(s) / ED Diagnoses   Final diagnoses:  Muscle strain    ED Discharge Orders        Ordered    diclofenac sodium (VOLTAREN) 1 % GEL  4 times daily     01/08/17 0103    tizanidine (ZANAFLEX) 2 MG capsule  3 times daily     01/08/17 0103       Volanda Napoleon, PA-C 01/08/17 1146    Julianne Rice, MD 01/08/17 2149

## 2017-01-08 NOTE — ED Notes (Signed)
Pt departed in NAD, refused use of wheelchair.  

## 2017-01-08 NOTE — ED Notes (Signed)
Pt ambulated to bathroom.  Gait steady and even.  Able to toilet independently.

## 2017-01-08 NOTE — Discharge Instructions (Signed)
You can take Tylenol or Ibuprofen as directed for pain. You can alternate Tylenol and Ibuprofen every 4 hours. If you take Tylenol at 1pm, then you can take Ibuprofen at 5pm. Then you can take Tylenol again at 9pm.   You can take the Zanaflex for further pain. Do not drive or drink while taking this medication as it may make you sleepy.  Follow-up with your primary care doctor in the next 24-48 hours for further evaluation.  Return to the emergency department for any worsening pain, numbness/weakness of your arms or legs, difficulty breathing, chest pain, vomiting or any other worsening of this.

## 2017-01-09 DIAGNOSIS — S2231XA Fracture of one rib, right side, initial encounter for closed fracture: Secondary | ICD-10-CM | POA: Diagnosis not present

## 2017-01-09 DIAGNOSIS — J449 Chronic obstructive pulmonary disease, unspecified: Secondary | ICD-10-CM | POA: Diagnosis not present

## 2017-01-09 DIAGNOSIS — R69 Illness, unspecified: Secondary | ICD-10-CM | POA: Diagnosis not present

## 2017-01-09 DIAGNOSIS — R0902 Hypoxemia: Secondary | ICD-10-CM | POA: Diagnosis not present

## 2017-01-13 DIAGNOSIS — R0902 Hypoxemia: Secondary | ICD-10-CM | POA: Diagnosis not present

## 2017-01-13 DIAGNOSIS — J449 Chronic obstructive pulmonary disease, unspecified: Secondary | ICD-10-CM | POA: Diagnosis not present

## 2017-02-12 DIAGNOSIS — J449 Chronic obstructive pulmonary disease, unspecified: Secondary | ICD-10-CM | POA: Diagnosis not present

## 2017-02-12 DIAGNOSIS — R0902 Hypoxemia: Secondary | ICD-10-CM | POA: Diagnosis not present

## 2017-02-16 ENCOUNTER — Encounter (HOSPITAL_COMMUNITY): Payer: Self-pay | Admitting: Emergency Medicine

## 2017-02-16 ENCOUNTER — Other Ambulatory Visit: Payer: Self-pay

## 2017-02-16 ENCOUNTER — Emergency Department (HOSPITAL_COMMUNITY)
Admission: EM | Admit: 2017-02-16 | Discharge: 2017-02-16 | Disposition: A | Discharge status: Home / Self Care | Payer: Medicare HMO | Attending: Emergency Medicine | Admitting: Emergency Medicine

## 2017-02-16 DIAGNOSIS — F17210 Nicotine dependence, cigarettes, uncomplicated: Secondary | ICD-10-CM | POA: Insufficient documentation

## 2017-02-16 DIAGNOSIS — R69 Illness, unspecified: Secondary | ICD-10-CM | POA: Diagnosis not present

## 2017-02-16 DIAGNOSIS — Z79899 Other long term (current) drug therapy: Secondary | ICD-10-CM | POA: Insufficient documentation

## 2017-02-16 DIAGNOSIS — E876 Hypokalemia: Secondary | ICD-10-CM | POA: Diagnosis not present

## 2017-02-16 DIAGNOSIS — Z7982 Long term (current) use of aspirin: Secondary | ICD-10-CM | POA: Diagnosis not present

## 2017-02-16 DIAGNOSIS — G40909 Epilepsy, unspecified, not intractable, without status epilepticus: Secondary | ICD-10-CM | POA: Diagnosis not present

## 2017-02-16 DIAGNOSIS — R031 Nonspecific low blood-pressure reading: Secondary | ICD-10-CM | POA: Diagnosis not present

## 2017-02-16 DIAGNOSIS — R569 Unspecified convulsions: Secondary | ICD-10-CM | POA: Diagnosis present

## 2017-02-16 LAB — I-STAT CHEM 8, ED
BUN: 6 mg/dL (ref 6–20)
CALCIUM ION: 0.67 mmol/L — AB (ref 1.15–1.40)
CHLORIDE: 121 mmol/L — AB (ref 101–111)
CREATININE: 0.3 mg/dL — AB (ref 0.61–1.24)
Glucose, Bld: 47 mg/dL — ABNORMAL LOW (ref 65–99)
HEMATOCRIT: 16 % — AB (ref 39.0–52.0)
Hemoglobin: 5.4 g/dL — CL (ref 13.0–17.0)
Potassium: 3.1 mmol/L — ABNORMAL LOW (ref 3.5–5.1)
Sodium: 149 mmol/L — ABNORMAL HIGH (ref 135–145)
TCO2: 15 mmol/L — AB (ref 22–32)

## 2017-02-16 LAB — CBC WITH DIFFERENTIAL/PLATELET
Basophils Absolute: 0 10*3/uL (ref 0.0–0.1)
Basophils Relative: 0 %
Eosinophils Absolute: 0.1 10*3/uL (ref 0.0–0.7)
Eosinophils Relative: 1 %
HEMATOCRIT: 36.3 % — AB (ref 39.0–52.0)
HEMOGLOBIN: 12.2 g/dL — AB (ref 13.0–17.0)
LYMPHS ABS: 1.2 10*3/uL (ref 0.7–4.0)
Lymphocytes Relative: 11 %
MCH: 34.5 pg — AB (ref 26.0–34.0)
MCHC: 33.6 g/dL (ref 30.0–36.0)
MCV: 102.5 fL — ABNORMAL HIGH (ref 78.0–100.0)
MONO ABS: 0.6 10*3/uL (ref 0.1–1.0)
MONOS PCT: 5 %
NEUTROS ABS: 9.5 10*3/uL — AB (ref 1.7–7.7)
NEUTROS PCT: 83 %
Platelets: 232 10*3/uL (ref 150–400)
RBC: 3.54 MIL/uL — ABNORMAL LOW (ref 4.22–5.81)
RDW: 14.6 % (ref 11.5–15.5)
WBC: 11.4 10*3/uL — ABNORMAL HIGH (ref 4.0–10.5)

## 2017-02-16 LAB — ABO/RH: ABO/RH(D): O POS

## 2017-02-16 LAB — TYPE AND SCREEN
ABO/RH(D): O POS
Antibody Screen: NEGATIVE

## 2017-02-16 LAB — CBG MONITORING, ED: Glucose-Capillary: 75 mg/dL (ref 65–99)

## 2017-02-16 LAB — POC OCCULT BLOOD, ED: Fecal Occult Bld: NEGATIVE

## 2017-02-16 MED ORDER — POTASSIUM CHLORIDE CRYS ER 20 MEQ PO TBCR
40.0000 meq | EXTENDED_RELEASE_TABLET | Freq: Once | ORAL | Status: AC
  Administered 2017-02-16: 40 meq via ORAL
  Filled 2017-02-16: qty 2

## 2017-02-16 NOTE — ED Notes (Signed)
Pt CBG, 75. Nurse was notified.

## 2017-02-16 NOTE — ED Notes (Signed)
Pt stable, ambulatory, A/O, and verbalizes understanding of d/c instructions.

## 2017-02-16 NOTE — ED Provider Notes (Signed)
[facility] EMERGENCY DEPARTMENT Provider Note   CSN: [id number] Arrival date & time: 02/16/17  1241     History   Chief Complaint No chief complaint on file.   HPI Curtis Gentry is a 71 y.o. male.  HPI Patient had seizure today.  His family reports that he has seizures every 2-3 days for the past several weeks.  Patient denies noncompliance with his medications.  Seizure was witnessed by a bystander at a gasoline station who called 911.  Patient reports he would not come to the hospital had he not been out of the home.  He is presently asymptomatic and feels well.  He denies headache denies difficulty speaking denies feeling weak.  Denies feeling ill.  No other associated symptoms. Past Medical History:  Diagnosis Date  . Anemia   . Anxiety   . Arthritis   . Erectile dysfunction   . Hyperlipidemia   . Peptic ulcer disease   . Perforated ulcer ([place])   . Unspecified disease of spinal cord    myelopahty, cervical spine   Seizures Patient Active Problem List   Diagnosis Date Noted  . Tobacco abuse 10/03/2016  . BPH (benign prostatic hyperplasia) 10/03/2016  . SDH (subdural hematoma) ([place]) 10/03/2016  . Slurred speech 10/03/2016  . Hyperlipidemia   . Anxiety   . 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 Abnormality of gait 05/03/2015  . Hereditary and idiopathic peripheral neuropathy 05/03/2015  . Syncope and collapse 08/20/2011  . ANEMIA-UNSPECIFIED 09/06/2008  . DYSPHAGIA 09/06/2008    Past Surgical History:  Procedure Laterality Date  . BACK SURGERY  2011   3 level ACD with fusion and plating  . HEMORRHOID SURGERY    . PARTIAL GASTRECTOMY         Home Medications    Prior to Admission medications   Medication Sig Start Date End Date Taking? Authorizing Provider  aspirin EC 81 MG tablet Take 81 mg by mouth daily.    [provider]  atorvastatin (LIPITOR) 10 MG tablet Take 10 mg by mouth at bedtime.     [provider]  Cholecalciferol (VITAMIN D) 2000 units tablet Take 2,000 Units by mouth daily.    [provider]  Cyanocobalamin (VITAMIN B-12 PO) Take 1 tablet daily by mouth.    [provider]  diazepam (VALIUM) 5 MG tablet Take 5 mg as needed by mouth (EVERY 8-12 HOURS AS NEEDED FOR ANXIETY).  04/29/16   [provider]  diclofenac sodium (VOLTAREN) 1 % GEL Apply 2 g 4 (four) times daily topically. 01/08/17   Volanda Napoleon, PA-C  escitalopram (LEXAPRO) 5 MG tablet Take 5 mg daily by mouth.    [provider]  gabapentin (NEURONTIN) 300 MG capsule Take 300 mg daily as needed by mouth (nerve pain).     [provider]  HYDROcodone-acetaminophen (NORCO/VICODIN) 5-325 MG per tablet Take 1 tablet See admin instructions by mouth. EVERY 4-6 HOURS AS NEEDED FOR PAIN 02/23/14   [provider]  ibuprofen (ADVIL,MOTRIN) 200 MG tablet Take 200 mg at bedtime by mouth.    [provider]  Multiple Vitamin (MULTIVITAMIN WITH MINERALS) TABS tablet Take 1 tablet by mouth daily.    [provider]  Omega-3 Fatty Acids (FISH OIL PO) Take 1,200 mg daily by mouth.     [provider]  senna-docusate (SENOKOT-S) 8.6-50 MG tablet Take 1 tablet by mouth at bedtime as needed for mild constipation. 10/04/16   Nettey,  Evalee Jefferson, MD  tamsulosin (FLOMAX) 0.4 MG CAPS capsule Take 0.4 mg by mouth daily after supper.  06/25/16   [provider]  terbinafine (LAMISIL) 250 MG tablet Take 250 mg daily by mouth. 12/31/16   [provider]    Family History Family History  Problem Relation Age of Onset  . Stroke Mother   . COPD Father        ?  Marland Kitchen Heart attack Father   . CAD Father   . Diabetes Unknown        family members  . CAD Unknown        family members  . Colon cancer Neg Hx   . Neuropathy Neg Hx     Social History Social History   Tobacco Use  . Smoking status: Current Every Day Smoker    Packs/day: 1.00     Years: 50.00    Pack years: 50.00  . Smokeless tobacco: Current User  Substance Use Topics  . Alcohol use: No    Comment: 2-4 ounces alcohol per day  . Drug use: No     Allergies   Patient has no known allergies.   Review of Systems Review of Systems  Constitutional: Negative.   HENT: Negative.   Respiratory: Negative.   Cardiovascular: Negative.   Gastrointestinal: Negative.   Musculoskeletal: Positive for back pain.       Walks with chronic limp favoring left leg  Skin: Negative.   Neurological: Positive for seizures.  Psychiatric/Behavioral: Negative.   All other systems reviewed and are negative.    Physical Exam Updated Vital Signs There were no vitals taken for this visit.  Physical Exam  Constitutional: He is oriented to person, place, and time. No distress.  Chronically ill-appearing no distress  HENT:  Head: Normocephalic and atraumatic.  Eyes: Conjunctivae are normal. Pupils are equal, round, and reactive to light.  Neck: Neck supple. No tracheal deviation present. No thyromegaly present.  Cardiovascular: Normal rate and regular rhythm.  No murmur heard. Pulmonary/Chest: Effort normal and breath sounds normal.  Abdominal: Soft. Bowel sounds are normal. He exhibits no distension. There is no tenderness.  Genitourinary: Rectal exam shows guaiac negative stool.  Genitourinary Comments: Rectum normal tone [place] stool Hemoccult negative  Musculoskeletal: Normal range of motion. He exhibits no edema or tenderness.  Neurological: He is alert and oriented to person, place, and time. Coordination normal.  DTR symmetric bilaterally at knee jerk ankle jerk and biceps he has 3-4 beat clonus at bilaterally at ankle jerks.  Toes downgoing bilaterally.  Motor strength 5/5 overall he walks with slight limp favoring left leg  Skin: Skin is warm and dry. No rash noted.  Psychiatric: He has a normal mood and affect.  Nursing note and vitals reviewed.    ED Treatments /  Results  Labs (all labs ordered are listed, but only abnormal results are displayed) Labs Reviewed - No data to display  EKG  EKG Interpretation  Date/Time:  Monday February 16 2017 12:50:10 EST Ventricular Rate:  51 PR Interval:    QRS Duration: 88 QT Interval:  488 QTC Calculation: 450 R Axis:   51 Text Interpretation:  Sinus rhythm Atrial premature complexes Minimal ST elevation, anterior leads No significant change since last tracing Confirmed by Orlie Dakin [phone]) on 02/16/2017 1:31:18 PM       Radiology No results found.  Procedures Procedures (including critical care time)  Medications Ordered in ED Medications - No data to display   Initial  Impression / Assessment and Plan / ED Course  I have reviewed the triage vital signs and the nursing notes.  Pertinent labs & imaging results that were available during my care of the patient were reviewed by me and considered in my medical decision making (see chart for details).     3 PM patient is asymptomatic looks and feels well.  Strongly doubt infection.  Normal white count he feels well.  I will feel the patient needs rewarming. Received potassium chloride 40 mg orally prior to discharge. Suggest follow-up with Dr.Avva may need referral to neurologist Low hemoglobin seen on i-STAT felt to beFactitious Final Clinical Impressions(s) / ED Diagnoses  Diagnosis#1 seizure disorder #2 hypokalemia Final diagnoses:  None    ED Discharge Orders    None       Orlie Dakin, MD 02/16/17 1510

## 2017-02-16 NOTE — ED Triage Notes (Signed)
Per EMS: Pt from home.  Pt was at the gas station and had a witnessed seizure that lasted approx 1 minute.  Pt had no incontinence during episode. When EMS arrived pt's BP was in the 28-00'L systolic.  EMS administered 250 NS, BP repeated was in the 120's.   Pt states his last seizure was 6 months ago.  CBG on site was 186.  PT A/O x4.

## 2017-02-16 NOTE — ED Notes (Signed)
Dr. Cathleen Fears informed of pts rectal temp of 96.2. He stated no bear hugger at this time.

## 2017-02-16 NOTE — ED Notes (Signed)
Dr. Cathleen Fears at bedside.

## 2017-02-16 NOTE — Discharge Instructions (Signed)
No driving allowed until [facility] by Dr.Avva by a neurologist.  Call Dr. Danna Hefty office on 02/18/2017 to arrange to be seen within the next 7-10 days.  Tell him that you have been having more seizures over recent weeks. you may need to be referred to a neurologist.  Return if concern for any reason

## 2017-02-19 DIAGNOSIS — E876 Hypokalemia: Secondary | ICD-10-CM | POA: Diagnosis not present

## 2017-02-19 DIAGNOSIS — R69 Illness, unspecified: Secondary | ICD-10-CM | POA: Diagnosis not present

## 2017-02-19 DIAGNOSIS — R55 Syncope and collapse: Secondary | ICD-10-CM | POA: Diagnosis not present

## 2017-02-19 DIAGNOSIS — Z681 Body mass index (BMI) 19 or less, adult: Secondary | ICD-10-CM | POA: Diagnosis not present

## 2017-02-20 ENCOUNTER — Telehealth: Payer: Self-pay

## 2017-02-20 NOTE — Telephone Encounter (Signed)
SENT REFERRAL TO SCHEDULING 

## 2017-03-03 NOTE — Progress Notes (Signed)
CARDIOLOGY OFFICE NOTE  Date:  03/04/2017    Curtis Gentry Date of Birth: 03/31/1945 Medical Record #[id number]  PCP:  [name], MD  Cardiologist:  [facility]   Chief Complaint  Patient presents with  . Loss of Consciousness    6 month check - seen for Dr. Marlou Porch    History of Present Illness: Curtis Gentry is a 72 y.o. male who presents today for a 6 month check. Seen for Dr. Marlou Porch.   He has had a history of recurrent syncope. This has been determined to be orthostasis. Last spell back in June of 2018 - orthostatics + again - echo was updated and event monitor was obtained - these turned out ok.  No other cardiac history. He does smoke.   In the ER over Christmas and the records note that this was for "recurrent seizure activity - had not been taking his medicines". Potassium was low and this was replaced. He was asked to see his PCP and consider neurology referral.   Records from his PCP note that the ER visit was for recurrent syncope and not seizures.   Comes in today. Here alone. He continues to drive. Tells me that he had another passing out spell - again while standing in line at a gas station. Says it always happens when standing in line - just not every time. Has probably had 3 or 4 spells over the past few years. His eyes got blurry and then "I went out". Only out for a second or so. No chest pain. Not short of breath. He is unaware of having any seizure disorder. He is not on seizure medicine. He does not know if he has seen neurology or not. He continues to smoke - not ready to stop. He liberalizes his salt. He does not use support stockings.   Past Medical History:  Diagnosis Date  . Abnormality of gait 05/03/2015  . Anemia   . ANEMIA-UNSPECIFIED 09/06/2008   Qualifier: Diagnosis of  By: Henrene Pastor MD, Docia Chuck   . Anxiety   . Arthritis   . BPH (benign prostatic hyperplasia) 10/03/2016  . DYSPHAGIA 09/06/2008   Qualifier: Diagnosis of  By: Henrene Pastor MD, Docia Chuck   .  Erectile dysfunction   . Hereditary and idiopathic peripheral neuropathy 05/03/2015  . Hyperlipidemia   . Hypokalemia 08/21/2011  . Peptic ulcer disease   . Perforated ulcer ([place])   . SDH (subdural hematoma) ([place]) 10/03/2016  . Slurred speech 10/03/2016  . 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 Syncope and collapse 08/20/2011  . Tobacco abuse 10/03/2016  . Unspecified disease of spinal cord    myelopahty, cervical spine    Past Surgical History:  Procedure Laterality Date  . BACK SURGERY  2011   3 level ACD with fusion and plating  . HEMORRHOID SURGERY    . PARTIAL GASTRECTOMY       Medications: Current Meds  Medication Sig  . aspirin EC 81 MG tablet Take 81 mg by mouth daily.  Marland Kitchen atorvastatin (LIPITOR) 10 MG tablet Take 10 mg by mouth at bedtime.   . Cholecalciferol (VITAMIN D) 2000 units tablet Take 2,000 Units by mouth daily.  . diazepam (VALIUM) 5 MG tablet Take 5 mg as needed by mouth (EVERY 8-12 HOURS AS NEEDED FOR ANXIETY).   Marland Kitchen diclofenac sodium (VOLTAREN) 1 % GEL Apply 2 g 4 (four) times daily topically.  . gabapentin (NEURONTIN) 300 MG capsule Take 300 mg daily as needed by  mouth (nerve pain).   Marland Kitchen HYDROcodone-acetaminophen (NORCO/VICODIN) 5-325 MG per tablet Take 1 tablet See admin instructions by mouth. EVERY 4-6 HOURS AS NEEDED FOR PAIN  . ibuprofen (ADVIL,MOTRIN) 200 MG tablet Take 200 mg at bedtime by mouth.  . Multiple Vitamin (MULTIVITAMIN WITH MINERALS) TABS tablet Take 1 tablet by mouth daily.  . Omega-3 Fatty Acids (FISH OIL PO) Take 1,200 mg by mouth 2 (two) times daily.   . OXYGEN Inhale 2 L into the lungs as needed (Shortness of Breath).   . senna-docusate (SENOKOT-S) 8.6-50 MG tablet Take 1 tablet by mouth at bedtime as needed for mild constipation.  . tamsulosin (FLOMAX) 0.4 MG CAPS capsule Take 0.4 mg by mouth daily after supper.      Allergies: No Known Allergies  Social History: The patient  reports that he has been smoking.  He has a 50.00  pack-year smoking history. He uses smokeless tobacco. He reports that he does not drink alcohol or use drugs.   Family History: The patient's family history includes CAD in his father and unknown relative; COPD in his father; Diabetes in his unknown relative; Heart attack in his father; Stroke in his mother.   Review of Systems: Please see the history of present illness.   Otherwise, the review of systems is positive for none.   All other systems are reviewed and negative.   Physical Exam: VS:  BP 137/85 (BP Location: Left Arm, Cuff Size: Normal)   Pulse 71   Ht 5\' 8"  (1.727 m)   Wt 117 lb 6.4 oz (53.3 kg)   SpO2 100% Comment: at rest  BMI 17.85 kg/m  .  BMI Body mass index is 17.85 kg/m.  Wt Readings from Last 3 Encounters:  03/04/17 117 lb 6.4 oz (53.3 kg)  10/04/16 112 lb 10.5 oz (51.1 kg)  07/28/16 118 lb 9.6 oz (53.8 kg)   Lying BP is 137/85 with HR 71 Sitting BP is 131/80 with HR 69 Standing BP is 103/71 with HR 82 Standing BP at 3 minutes is 122/85 with HR 83  General: Pleasant. He looks older than his stated age. He is quite thin. He is alert and in no acute distress.   HEENT: Normal.  Neck: Supple, no JVD, carotid bruits, or masses noted.  Cardiac: Regular rate and rhythm. No murmurs, rubs, or gallops. No edema.  Respiratory:  Lungs are clear to auscultation bilaterally with normal work of breathing.  GI: Soft and nontender.  MS: No deformity or atrophy. Gait and ROM intact.  Skin: Warm and dry. Color is normal.  Neuro:  Strength and sensation are intact and no gross focal deficits noted.  Psych: Alert, appropriate and with normal affect.   LABORATORY DATA:  EKG:  EKG is ordered today. This demonstrates NSR - reviewed with Dr. Marlou Porch. QT is 436 ms  Lab Results  Component Value Date   WBC 11.4 (H) 02/16/2017   HGB 12.2 (L) 02/16/2017   HCT 36.3 (L) 02/16/2017   PLT 232 02/16/2017   GLUCOSE 47 (L) 02/16/2017   CHOL 104 10/04/2016   TRIG 52 10/04/2016   HDL  36 (L) 10/04/2016   LDLCALC 58 10/04/2016   ALT 16 (L) 10/03/2016   AST 26 10/03/2016   NA 149 (H) 02/16/2017   K 3.1 (L) 02/16/2017   CL 121 (H) 02/16/2017   CREATININE 0.30 (L) 02/16/2017   BUN 6 02/16/2017   CO2 27 01/07/2017   TSH 2.660 05/01/2015   INR 1.19 10/03/2016  HGBA1C 5.8 (H) 10/04/2016     BNP (last 3 results) No results for input(s): BNP in the last 8760 hours.  ProBNP (last 3 results) No results for input(s): PROBNP in the last 8760 hours.   Other Studies Reviewed Today:  Event Monitor Notes recorded by Jerline Pain, MD on 09/18/2016 at 8:12 AM EDT  Rare PAC's and PVC's - correlated with symptoms of flutter or palpitations  No atrial fibrillation, no pauses  No bradycardia of significance Reassuring monitor Candee Furbish, MD   Echo Study Conclusions 07/2016  - Left ventricle: The cavity size was normal. Wall thickness was   normal. Systolic function was normal. The estimated ejection   fraction was in the range of 50% to 55%. Wall motion was normal;   there were no regional wall motion abnormalities.  MRI HEAD 09/2016:  1. No acute intracranial process, specifically no infarct. 2. 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 3. 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  MRA HEAD:  1. Normal MRA of the head.   Electronically Signed   By: Elon Alas M.D.   On: 10/04/2016 01:37  Assessment/Plan:  1. Recurrent syncope - typically occurs with standing - felt to be orthostatic. He has had a negative echo and event monitor just 6 months ago. Discussed with Dr. Marlou Porch here in the office - no further testing felt to be needed. Could start low dose Midodrine but his spells are so infrequent - would like to try knee high compression stockings, continue to liberalize salt. Defer neurology evaluation to PCP.   2. Orthostatic hypotension - see above.    3. 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   5. Prior hypokalemia - rechecking today with Mg level.    Current medicines are reviewed with the patient today.  The patient does not have concerns regarding medicines other than what has been noted above.  The following changes have been made:  See above.  Labs/ tests ordered today include:    Orders Placed This Encounter  Procedures  . Basic metabolic panel  . Magnesium  . EKG 12-Lead     Disposition:   FU with me in about 3 to 4 months.   Patient is agreeable to this plan and will call if any problems develop in the interim.   SignedTruitt Merle, NP  03/04/2017 10:47 AM  [facility] [address] [place], [place]  [address] Phone: [phone] Fax: [phone]

## 2017-03-04 ENCOUNTER — Ambulatory Visit: Payer: Medicare HMO | Admitting: Nurse Practitioner

## 2017-03-04 VITALS — BP 137/85 | HR 71 | Ht 68.0 in | Wt 117.4 lb

## 2017-03-04 DIAGNOSIS — R55 Syncope and collapse: Secondary | ICD-10-CM | POA: Diagnosis not present

## 2017-03-04 LAB — BASIC METABOLIC PANEL
BUN/Creatinine Ratio: 9 — ABNORMAL LOW (ref 10–24)
BUN: 8 mg/dL (ref 8–27)
CO2: 21 mmol/L (ref 20–29)
Calcium: 9.5 mg/dL (ref 8.6–10.2)
Chloride: 105 mmol/L (ref 96–106)
Creatinine, Ser: 0.92 mg/dL (ref 0.76–1.27)
GFR calc Af Amer: 96 mL/min/{1.73_m2} (ref >59)
GFR calc non Af Amer: 83 mL/min/{1.73_m2} (ref >59)
Glucose: 107 mg/dL — ABNORMAL HIGH (ref 65–99)
Potassium: 4.6 mmol/L (ref 3.5–5.2)
Sodium: 142 mmol/L (ref 134–144)

## 2017-03-04 LAB — MAGNESIUM: Magnesium: 1.8 mg/dL (ref 1.6–2.3)

## 2017-03-04 NOTE — Patient Instructions (Addendum)
We will be checking the following labs today - BMET and Mg level   Medication Instructions:    Continue with your current medicines for now.     Testing/Procedures To Be Arranged:  N/A  Follow-Up:   See me in about 3 to 4 months    Other Special Instructions:   Knee high support stockings  Continue to liberalize your use of salt     If you need a refill on your cardiac medications before your next appointment, please call your pharmacy.   Call the [place] office at [phone] if you have any questions, problems or concerns.

## 2017-03-15 DIAGNOSIS — J449 Chronic obstructive pulmonary disease, unspecified: Secondary | ICD-10-CM | POA: Diagnosis not present

## 2017-03-15 DIAGNOSIS — R0902 Hypoxemia: Secondary | ICD-10-CM | POA: Diagnosis not present

## 2017-04-15 DIAGNOSIS — J449 Chronic obstructive pulmonary disease, unspecified: Secondary | ICD-10-CM | POA: Diagnosis not present

## 2017-04-15 DIAGNOSIS — R0902 Hypoxemia: Secondary | ICD-10-CM | POA: Diagnosis not present

## 2017-05-13 ENCOUNTER — Encounter: Payer: Self-pay | Admitting: Nurse Practitioner

## 2017-05-13 DIAGNOSIS — R0902 Hypoxemia: Secondary | ICD-10-CM | POA: Diagnosis not present

## 2017-05-13 DIAGNOSIS — Z125 Encounter for screening for malignant neoplasm of prostate: Secondary | ICD-10-CM | POA: Diagnosis not present

## 2017-05-13 DIAGNOSIS — E7849 Other hyperlipidemia: Secondary | ICD-10-CM | POA: Diagnosis not present

## 2017-05-13 DIAGNOSIS — R7309 Other abnormal glucose: Secondary | ICD-10-CM | POA: Diagnosis not present

## 2017-05-13 DIAGNOSIS — J449 Chronic obstructive pulmonary disease, unspecified: Secondary | ICD-10-CM | POA: Diagnosis not present

## 2017-05-13 DIAGNOSIS — R82998 Other abnormal findings in urine: Secondary | ICD-10-CM | POA: Diagnosis not present

## 2017-05-20 DIAGNOSIS — R55 Syncope and collapse: Secondary | ICD-10-CM | POA: Diagnosis not present

## 2017-05-20 DIAGNOSIS — Z0000 Encounter for general adult medical examination without abnormal findings: Secondary | ICD-10-CM | POA: Diagnosis not present

## 2017-05-20 DIAGNOSIS — E785 Hyperlipidemia, unspecified: Secondary | ICD-10-CM | POA: Diagnosis not present

## 2017-05-20 DIAGNOSIS — R739 Hyperglycemia, unspecified: Secondary | ICD-10-CM | POA: Diagnosis not present

## 2017-05-20 DIAGNOSIS — J449 Chronic obstructive pulmonary disease, unspecified: Secondary | ICD-10-CM | POA: Diagnosis not present

## 2017-05-20 DIAGNOSIS — I6200 Nontraumatic subdural hemorrhage, unspecified: Secondary | ICD-10-CM | POA: Diagnosis not present

## 2017-05-20 DIAGNOSIS — M1990 Unspecified osteoarthritis, unspecified site: Secondary | ICD-10-CM | POA: Diagnosis not present

## 2017-05-20 DIAGNOSIS — R69 Illness, unspecified: Secondary | ICD-10-CM | POA: Diagnosis not present

## 2017-05-20 DIAGNOSIS — G959 Disease of spinal cord, unspecified: Secondary | ICD-10-CM | POA: Diagnosis not present

## 2017-06-02 ENCOUNTER — Encounter: Payer: Self-pay | Admitting: Nurse Practitioner

## 2017-06-03 ENCOUNTER — Encounter: Payer: Self-pay | Admitting: Nurse Practitioner

## 2017-06-03 ENCOUNTER — Ambulatory Visit: Payer: Medicare HMO | Admitting: Nurse Practitioner

## 2017-06-03 VITALS — BP 110/70 | HR 68 | Ht 68.0 in | Wt 118.0 lb

## 2017-06-03 DIAGNOSIS — R55 Syncope and collapse: Secondary | ICD-10-CM | POA: Diagnosis not present

## 2017-06-03 NOTE — Patient Instructions (Signed)
We will be checking the following labs today - NONE   Medication Instructions:    Continue with your current medicines.     Testing/Procedures To Be Arranged:  N/A  Follow-Up:   See Dr. Marlou Porch in 6 months    Other Special Instructions:   N/A    If you need a refill on your cardiac medications before your next appointment, please call your pharmacy.   Call the [place] office at [phone] if you have any questions, problems or concerns.

## 2017-06-03 NOTE — Progress Notes (Signed)
CARDIOLOGY OFFICE NOTE  Date:  06/03/2017    Curtis Gentry Date of Birth: 12-04-1945 Medical Record #[id number]  PCP:  [name], MD  Cardiologist:  Marisa Cyphers    Chief Complaint  Patient presents with  . Loss of Consciousness    3 month check - seen for Dr. Marlou Porch    History of Present Illness: Curtis Gentry is a 72 y.o. male who presents today for a 3 month check. Seen for Dr. Marlou Porch.   He has had a history of recurrent syncope. This has been determined to be orthostasis. Last spell back in June of 2018 - orthostatics + again - echo was updated and event monitor was obtained - these turned out ok.  No other cardiac history. He does smoke. His other issues include anxiety, chronic pain, COPD, ongoing tobacco use, HLD, OA, PUD, prior chronic subdural hematoma, & multilevel back disc disease.   In the ER over Christmas and the records note that this was for "recurrent seizure activity - had not been taking his medicines". Potassium was low and this was replaced. He was asked to see his PCP and consider neurology referral.   Records from his PCP note that the ER visit was for recurrent syncope and not seizures.   I then saw him back in January - he was continuing to have spells of passing out - most always with "standing in line". This was felt to be related to orthostasis. He was not sure if he had seen neurology or not. He continued to smoke. We recommended he use support stockings. Held off on starting Midodrine.   Comes in today. Here alone. He says he is doing well. No more passing out spells since he was last seen here. No chest pain. Breathing is ok. Still smoking. He feels like he is doing well. Labs from his PCP from March are noted - see below. He has no real concerns and feels like he is doing ok. Not using support stockings.   Past Medical History:  Diagnosis Date  . Abnormality of gait 05/03/2015  . Anemia   . ANEMIA-UNSPECIFIED 09/06/2008   Qualifier: Diagnosis of  By: Henrene Pastor MD, Docia Chuck   . Anxiety   . Arthritis   . BPH (benign prostatic hyperplasia) 10/03/2016  . DYSPHAGIA 09/06/2008   Qualifier: Diagnosis of  By: Henrene Pastor MD, Docia Chuck   . Erectile dysfunction   . Hereditary and idiopathic peripheral neuropathy 05/03/2015  . Hyperlipidemia   . Hypokalemia 08/21/2011  . Peptic ulcer disease   . Perforated ulcer ([place])   . SDH (subdural hematoma) ([place]) 10/03/2016  . Slurred speech 10/03/2016  . 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 Syncope and collapse 08/20/2011  . Tobacco abuse 10/03/2016  . Unspecified disease of spinal cord    myelopahty, cervical spine    Past Surgical History:  Procedure Laterality Date  . BACK SURGERY  2011   3 level ACD with fusion and plating  . HEMORRHOID SURGERY    . PARTIAL GASTRECTOMY       Medications: Current Meds  Medication Sig  . aspirin EC 81 MG tablet Take 81 mg by mouth daily.  Marland Kitchen atorvastatin (LIPITOR) 10 MG tablet Take 10 mg by mouth at bedtime.   . Cholecalciferol (VITAMIN D) 2000 units tablet Take 2,000 Units by mouth daily.  . diazepam (VALIUM) 5 MG tablet Take 5 mg as needed by mouth (EVERY 8-12 HOURS AS NEEDED FOR ANXIETY).   Marland Kitchen  diclofenac sodium (VOLTAREN) 1 % GEL Apply 2 g 4 (four) times daily topically.  . gabapentin (NEURONTIN) 300 MG capsule Take 300 mg daily as needed by mouth (nerve pain).   Marland Kitchen HYDROcodone-acetaminophen (NORCO/VICODIN) 5-325 MG per tablet Take 1 tablet See admin instructions by mouth. EVERY 4-6 HOURS AS NEEDED FOR PAIN  . ibuprofen (ADVIL,MOTRIN) 200 MG tablet Take 200 mg at bedtime by mouth.  . Multiple Vitamin (MULTIVITAMIN WITH MINERALS) TABS tablet Take 1 tablet by mouth daily.  . Omega-3 Fatty Acids (FISH OIL PO) Take 1,200 mg by mouth 2 (two) times daily.   Marland Kitchen senna-docusate (SENOKOT-S) 8.6-50 MG tablet Take 1 tablet by mouth at bedtime as needed for mild constipation.  . tamsulosin (FLOMAX) 0.4 MG CAPS capsule Take 0.4 mg by mouth daily after  supper.   . [DISCONTINUED] OXYGEN Inhale 2 L into the lungs as needed (Shortness of Breath).      Allergies: No Known Allergies  Social History: The patient  reports that he has been smoking.  He has a 50.00 pack-year smoking history. He uses smokeless tobacco. He reports that he does not drink alcohol or use drugs.   Family History: The patient's family history includes CAD in his father and unknown relative; COPD in his father; Diabetes in his unknown relative; Heart attack in his father; Stroke in his mother.   Review of Systems: Please see the history of present illness.   Otherwise, the review of systems is positive for none.   All other systems are reviewed and negative.   Physical Exam: VS:  BP 110/70 (BP Location: Left Arm, Patient Position: Sitting, Cuff Size: Normal)   Pulse 68   Ht 5\' 8"  (1.727 m)   Wt 118 lb (53.5 kg)   SpO2 99% Comment: at rest  BMI 17.94 kg/m  .  BMI Body mass index is 17.94 kg/m.  Wt Readings from Last 3 Encounters:  06/03/17 118 lb (53.5 kg)  03/04/17 117 lb 6.4 oz (53.3 kg)  10/04/16 112 lb 10.5 oz (51.1 kg)    General: Pleasant. Elderly male. Thin. Alert and in no acute distress.  Smells of tobacco.  HEENT: Normal.  Neck: Supple, no JVD, carotid bruits, or masses noted.  Cardiac: Regular rate and rhythm. No murmurs, rubs, or gallops. No edema.  Respiratory:  Lungs are clear to auscultation bilaterally with normal work of breathing.  GI: Soft and nontender.  MS: No deformity or atrophy. Gait and ROM intact but he has a limp.  Skin: Warm and dry. Color is normal.  Neuro:  Strength and sensation are intact and no gross focal deficits noted.  Psych: Alert, appropriate and with normal affect.   LABORATORY DATA:  EKG:  EKG is not ordered today.  Lab Results  Component Value Date   WBC 11.4 (H) 02/16/2017   HGB 12.2 (L) 02/16/2017   HCT 36.3 (L) 02/16/2017   PLT 232 02/16/2017   GLUCOSE 107 (H) 03/04/2017   CHOL 104 10/04/2016    TRIG 52 10/04/2016   HDL 36 (L) 10/04/2016   LDLCALC 58 10/04/2016   ALT 16 (L) 10/03/2016   AST 26 10/03/2016   NA 142 03/04/2017   K 4.6 03/04/2017   CL 105 03/04/2017   CREATININE 0.92 03/04/2017   BUN 8 03/04/2017   CO2 21 03/04/2017   TSH 2.660 05/01/2015   INR 1.19 10/03/2016   HGBA1C 5.8 (H) 10/04/2016       BNP (last 3 results) No results for input(s): BNP  in the last 8760 hours.  ProBNP (last 3 results) No results for input(s): PROBNP in the last 8760 hours.   Other Studies Reviewed Today:  Event Monitor Notes recorded by Jerline Pain, MD on 09/18/2016 at 8:12 AM EDT  Rare PAC's and PVC's - correlated with symptoms of flutter or palpitations  No atrial fibrillation, no pauses  No bradycardia of significance Reassuring monitor Candee Furbish, MD   Echo Study Conclusions 07/2016  - Left ventricle: The cavity size was normal. Wall thickness was normal. Systolic function was normal. The estimated ejection fraction was in the range of 50% to 55%. Wall motion was normal; there were no regional wall motion abnormalities.  MRI HEAD 09/2016:  1. No acute intracranial process, specifically no infarct. 2. 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 3. 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  MRA HEAD:  1. Normal MRA of the head.   Electronically Signed By: Elon Alas M.D. On: 10/04/2016 01:37  Assessment/Plan:  1. History of syncope - typically occurs with standing - felt to be orthostatic. He has had a negative echo and event monitor in the past year. He is doing fine at this time.  Could start low dose Midodrine if needed but his spells are so infrequent. Would still advise trying knee high compression stockings, continue to liberalize salt and stay hydrated.  2. Orthostatic hypotension - see above.   3. Tobacco use - he is not  ready to stop  Current medicines are reviewed with the patient today.  The patient does not have concerns regarding medicines other than what has been noted above.  The following changes have been made:  See above.  Labs/ tests ordered today include:   No orders of the defined types were placed in this encounter.    Disposition:   FU with Dr. Marlou Porch in 6 months. I am happy to see back as needed.    Patient is agreeable to this plan and will call if any problems develop in the interim.   SignedTruitt Merle, NP  06/03/2017 9:44 AM  [facility] [address] [place], [place]  [address] Phone: [phone] Fax: [phone]

## 2017-08-21 DIAGNOSIS — R269 Unspecified abnormalities of gait and mobility: Secondary | ICD-10-CM | POA: Diagnosis not present

## 2017-08-21 DIAGNOSIS — M5412 Radiculopathy, cervical region: Secondary | ICD-10-CM | POA: Diagnosis not present

## 2017-08-21 DIAGNOSIS — R69 Illness, unspecified: Secondary | ICD-10-CM | POA: Diagnosis not present

## 2017-08-21 DIAGNOSIS — M1990 Unspecified osteoarthritis, unspecified site: Secondary | ICD-10-CM | POA: Diagnosis not present

## 2017-10-20 ENCOUNTER — Other Ambulatory Visit: Payer: Self-pay

## 2017-10-20 ENCOUNTER — Emergency Department: Payer: Medicare HMO

## 2017-10-20 ENCOUNTER — Emergency Department
Admission: EM | Admit: 2017-10-20 | Discharge: 2017-10-21 | Disposition: A | Discharge status: Home / Self Care | Payer: Medicare HMO | Attending: Emergency Medicine | Admitting: Emergency Medicine

## 2017-10-20 ENCOUNTER — Encounter: Payer: Self-pay | Admitting: Emergency Medicine

## 2017-10-20 DIAGNOSIS — R270 Ataxia, unspecified: Secondary | ICD-10-CM | POA: Diagnosis not present

## 2017-10-20 DIAGNOSIS — R251 Tremor, unspecified: Secondary | ICD-10-CM | POA: Insufficient documentation

## 2017-10-20 DIAGNOSIS — R200 Anesthesia of skin: Secondary | ICD-10-CM

## 2017-10-20 DIAGNOSIS — Z7982 Long term (current) use of aspirin: Secondary | ICD-10-CM | POA: Insufficient documentation

## 2017-10-20 DIAGNOSIS — R292 Abnormal reflex: Secondary | ICD-10-CM | POA: Insufficient documentation

## 2017-10-20 DIAGNOSIS — Z79899 Other long term (current) drug therapy: Secondary | ICD-10-CM | POA: Diagnosis not present

## 2017-10-20 LAB — CBC WITH DIFFERENTIAL/PLATELET
BASOS PCT: 1 %
Basophils Absolute: 0.1 10*3/uL (ref 0–0.1)
EOS ABS: 0.2 10*3/uL (ref 0–0.7)
EOS PCT: 2 %
HCT: 34.1 % — ABNORMAL LOW (ref 40.0–52.0)
Hemoglobin: 12.2 g/dL — ABNORMAL LOW (ref 13.0–18.0)
Lymphocytes Relative: 28 %
Lymphs Abs: 2.4 10*3/uL (ref 1.0–3.6)
MCH: 36.7 pg — ABNORMAL HIGH (ref 26.0–34.0)
MCHC: 35.7 g/dL (ref 32.0–36.0)
MCV: 102.7 fL — ABNORMAL HIGH (ref 80.0–100.0)
MONO ABS: 0.7 10*3/uL (ref 0.2–1.0)
MONOS PCT: 8 %
Neutro Abs: 5.1 10*3/uL (ref 1.4–6.5)
Neutrophils Relative %: 61 %
PLATELETS: 218 10*3/uL (ref 150–440)
RBC: 3.32 MIL/uL — ABNORMAL LOW (ref 4.40–5.90)
RDW: 14.1 % (ref 11.5–14.5)
WBC: 8.5 10*3/uL (ref 3.8–10.6)

## 2017-10-20 LAB — BASIC METABOLIC PANEL
ANION GAP: 6 (ref 5–15)
BUN: 12 mg/dL (ref 8–23)
CALCIUM: 8.4 mg/dL — AB (ref 8.9–10.3)
CO2: 29 mmol/L (ref 22–32)
CREATININE: 0.9 mg/dL (ref 0.61–1.24)
Chloride: 102 mmol/L (ref 98–111)
GFR calc Af Amer: >60 mL/min (ref >60)
GLUCOSE: 79 mg/dL (ref 70–99)
Potassium: 4.1 mmol/L (ref 3.5–5.1)
Sodium: 137 mmol/L (ref 135–145)

## 2017-10-20 LAB — MAGNESIUM: Magnesium: 2 mg/dL (ref 1.7–2.4)

## 2017-10-20 NOTE — ED Triage Notes (Signed)
Pt arrived to the ED accompanied by his daughter for complaints of right leg numbness. Pt reports that he noticed yesterday that his right leg began to feel numb and heavy and shakes while walking. Pt denies any back pain, headache, injury or any other neurological deficiency. Pt is AOx4 in no apparent distress.

## 2017-10-20 NOTE — ED Notes (Signed)
Patient transported to MRI 

## 2017-10-20 NOTE — ED Provider Notes (Signed)
[facility] Emergency Department Provider Note  ____________________________________________  Time seen: Approximately 11:01 PM  I have reviewed the triage vital signs and the nursing notes.   HISTORY  Chief Complaint Numbness    HPI Curtis Gentry is a 72 y.o. male with a history of anemia peripheral neuropathy subdural hematoma and gait abnormality who comes to the ED complaining of right leg numbness and spasticity.  This just started yesterday according to the patient.  He feels like it is episodic, worse with walking, better with rest.  Denies pain or paresthesia.  No falls or trauma.  Denies headache vision changes back pain fevers or chills.  He notes that he has chronically had very strong reflexes in the lower legs.      Past Medical History:  Diagnosis Date  . Abnormality of gait 05/03/2015  . Anemia   . ANEMIA-UNSPECIFIED 09/06/2008   Qualifier: Diagnosis of  By: Henrene Pastor MD, Docia Chuck   . Anxiety   . Arthritis   . BPH (benign prostatic hyperplasia) 10/03/2016  . DYSPHAGIA 09/06/2008   Qualifier: Diagnosis of  By: Henrene Pastor MD, Docia Chuck   . Erectile dysfunction   . Hereditary and idiopathic peripheral neuropathy 05/03/2015  . Hyperlipidemia   . Hypokalemia 08/21/2011  . Peptic ulcer disease   . Perforated ulcer ([place])   . SDH (subdural hematoma) ([place]) 10/03/2016  . Slurred speech 10/03/2016  . 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 Syncope and collapse 08/20/2011  . Tobacco abuse 10/03/2016  . Unspecified disease of spinal cord    myelopahty, cervical spine     Patient Active Problem List   Diagnosis Date Noted  . Tobacco abuse 10/03/2016  . BPH (benign prostatic hyperplasia) 10/03/2016  . SDH (subdural hematoma) ([place]) 10/03/2016  . Slurred speech 10/03/2016  . Hyperlipidemia   . Anxiety   . 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 Abnormality of gait 05/03/2015  . Hereditary and idiopathic peripheral neuropathy 05/03/2015   . Syncope and collapse 08/20/2011  . ANEMIA-UNSPECIFIED 09/06/2008  . DYSPHAGIA 09/06/2008     Past Surgical History:  Procedure Laterality Date  . BACK SURGERY  2011   3 level ACD with fusion and plating  . HEMORRHOID SURGERY    . PARTIAL GASTRECTOMY       Prior to Admission medications   Medication Sig Start Date End Date Taking? Authorizing Provider  aspirin EC 81 MG tablet Take 81 mg by mouth daily.   Yes [provider]  atorvastatin (LIPITOR) 10 MG tablet Take 10 mg by mouth at bedtime.    Yes [provider]  Cholecalciferol (VITAMIN D) 2000 units tablet Take 2,000 Units by mouth daily.   Yes [provider]  gabapentin (NEURONTIN) 300 MG capsule Take 300 mg by mouth 3 (three) times daily as needed (nerve pain).    Yes [provider]  Multiple Vitamin (MULTIVITAMIN WITH MINERALS) TABS tablet Take 1 tablet by mouth daily.   Yes [provider]  Omega-3 Fatty Acids (FISH OIL PO) Take 1,200 mg by mouth 2 (two) times daily.    Yes [provider]  pantoprazole (PROTONIX) 40 MG tablet Take 1 tablet by mouth daily. 08/14/17  Yes [provider]  diazepam (VALIUM) 5 MG tablet Take 5 mg as needed by mouth (EVERY 8-12 HOURS AS NEEDED FOR ANXIETY).  04/29/16   [provider]  diclofenac sodium (VOLTAREN) 1 % GEL Apply 2 g 4 (four) times daily topically. Patient not taking: Reported  on 10/20/2017 01/08/17   Volanda Napoleon, PA-C  senna-docusate (SENOKOT-S) 8.6-50 MG tablet Take 1 tablet by mouth at bedtime as needed for mild constipation. Patient not taking: Reported on 10/20/2017 10/04/16   Mariel Aloe, MD     Allergies Patient has no known allergies.   Family History  Problem Relation Age of Onset  . Stroke Mother   . COPD Father        ?  Marland Kitchen Heart attack Father   . CAD Father   . Diabetes Unknown        family members  . CAD Unknown        family members  . Colon cancer Neg Hx   . Neuropathy Neg  Hx     Social History Social History   Tobacco Use  . Smoking status: Current Every Day Smoker    Packs/day: 1.00    Years: 50.00    Pack years: 50.00  . Smokeless tobacco: Current User  Substance Use Topics  . Alcohol use: No    Comment: 2-4 ounces alcohol per day  . Drug use: No    Review of Systems  Constitutional:   No fever or chills.  Cardiovascular:   No chest pain or syncope. Respiratory:   No dyspnea or cough. Gastrointestinal:   Negative for abdominal pain, vomiting and diarrhea.  Musculoskeletal:   Negative for focal pain or swelling All other systems reviewed and are negative except as documented above in ROS and HPI.  ____________________________________________   PHYSICAL EXAM:  VITAL SIGNS: ED Triage Vitals  Enc Vitals Group     BP 10/20/17 1916 117/76     Pulse Rate 10/20/17 1916 62     Resp 10/20/17 1916 18     Temp 10/20/17 1916 98.1 F (36.7 C)     Temp Source 10/20/17 1916 Oral     SpO2 10/20/17 1916 98 %     Weight 10/20/17 1917 118 lb (53.5 kg)     Height 10/20/17 1917 5\' 7"  (1.702 m)     Head Circumference --      Peak Flow --      Pain Score 10/20/17 1916 0     Pain Loc --      Pain Edu? --      Excl. in [place]? --     Vital signs reviewed, nursing assessments reviewed.   Constitutional:   Alert and oriented. Non-toxic appearance. Eyes:   Conjunctivae are normal. EOMI. PERRL. ENT      Head:   Normocephalic and atraumatic.      Nose:   No congestion/rhinnorhea.       Mouth/Throat:   MMM, no pharyngeal erythema. No peritonsillar mass.       Neck:   No meningismus. Full ROM.  No midline tenderness Hematological/Lymphatic/Immunilogical:   No cervical lymphadenopathy. Cardiovascular:   RRR. Symmetric bilateral radial and DP pulses.  No murmurs. Cap refill less than 2 seconds. Respiratory:   Normal respiratory effort without tachypnea/retractions. Breath sounds are clear and equal bilaterally. No wheezes/rales/rhonchi. Gastrointestinal:    Soft and nontender. Non distended. There is no CVA tenderness.  No rebound, rigidity, or guarding.  Musculoskeletal:   Normal range of motion in all extremities. No joint effusions.  No lower extremity tenderness.  No edema.  No midline tenderness Neurologic:   Normal speech and language.  Motor grossly intact.. Sensation normal Bilateral patellar reflexes are hyperreflexic with 3 or 4 beats of clonus.  Bilateral biceps reflexes are normal.  No pronator drift Cranial nerves III through XII intact Gait testing reveals normally based stance with irregular jerking movements while walking.  Able to maintain balance and shift weight. Normal muscular tone Skin:    Skin is warm, dry and intact. No rash noted.  No petechiae, purpura, or bullae.  ____________________________________________    LABS (pertinent positives/negatives) (all labs ordered are listed, but only abnormal results are displayed) Labs Reviewed  BASIC METABOLIC PANEL - Abnormal; Notable for the following components:      Result Value   Calcium 8.4 (*)    All other components within normal limits  CBC WITH DIFFERENTIAL/PLATELET - Abnormal; Notable for the following components:   RBC 3.32 (*)    Hemoglobin 12.2 (*)    HCT 34.1 (*)    MCV 102.7 (*)    MCH 36.7 (*)    All other components within normal limits  MAGNESIUM  RPR   ____________________________________________   EKG    ____________________________________________    RADIOLOGY  Ct Head Wo Contrast  Result Date: 10/20/2017 CLINICAL DATA:  Right leg numbness EXAM: CT HEAD WITHOUT CONTRAST TECHNIQUE: Contiguous axial images were obtained from the base of the skull through the vertex without intravenous contrast. COMPARISON:  10/03/2016.  MRI 10/04/2016. FINDINGS: Brain: Mild age related volume loss. No acute intracranial abnormality. Specifically, no hemorrhage, hydrocephalus, mass lesion, acute infarction, or significant intracranial injury. Vascular: No  hyperdense vessel or unexpected calcification. Skull: No acute calvarial abnormality. Sinuses/Orbits: No acute finding Other: None IMPRESSION: Mild cerebral atrophy.  No acute intracranial abnormality. Electronically Signed   By: Rolm Baptise M.D.   On: 10/20/2017 19:41   Mr Brain Wo Contrast  Result Date: 10/20/2017 CLINICAL DATA:  Ataxia. RIGHT leg numbness and heaviness beginning yesterday. History of subdural hematoma, hyperlipidemia, neuropathy. EXAM: MRI HEAD WITHOUT CONTRAST TECHNIQUE: Multiplanar, multiecho pulse sequences of the brain and surrounding structures were obtained without intravenous contrast. COMPARISON:  CT HEAD October 20, 2017 and MRI head October 04, 2016. FINDINGS: INTRACRANIAL CONTENTS: No reduced diffusion to suggest acute ischemia. No susceptibility artifact to suggest hemorrhage. The ventricles and sulci are normal for patient's age. Scattered subcentimeter and patchy pontine white matter FLAIR T2 hyperintensities slightly increased from prior examination. No suspicious parenchymal signal, masses, mass effect. No abnormal extra-axial fluid collections, complete resolution of prior LEFT subdural hematoma/hygroma. No extra-axial masses. VASCULAR: Normal major intracranial vascular flow voids present at skull base. SKULL AND UPPER CERVICAL SPINE: No abnormal sellar expansion. No suspicious calvarial bone marrow signal. Craniocervical junction maintained. SINUSES/ORBITS: Minimal mastoid effusions. Trace paranasal sinus mucosal thickening without air-fluid levels. Included ocular globes and orbital contents are non-suspicious. OTHER: Patient is edentulous.  ACDF. IMPRESSION: 1. No acute intracranial process. 2. Mild-to-moderate chronic small vessel ischemic changes. Electronically Signed   By: Elon Alas M.D.   On: 10/20/2017 21:39    ____________________________________________   PROCEDURES Procedures  ____________________________________________  DIFFERENTIAL  DIAGNOSIS   Intracranial hemorrhage, stroke, myelitis, syringomyelia, syphilis  CLINICAL IMPRESSION / ASSESSMENT AND PLAN / ED COURSE  Pertinent labs & imaging results that were available during my care of the patient were reviewed by me and considered in my medical decision making (see chart for details).      Clinical Course as of Oct 20 2299  Tue Oct 20, 2017  2006 P/w BLE spasticity, hyperreflexia (chronic per patient?), normal tone, normal VS, otherwise normal neuro exam. Odd presentation.  Possibly electrolyte disturbance vs cva. Unlikely medication related. CT head negative. Will obtain labs and mri  brain for further eval.    [PS]  2150 Mri brain negative. Labs normal. Will obtain mri C/T/L spine to eval for upper motor neuron injury.   [PS]    Clinical Course User Index [PS] Carrie Mew, MD     ----------------------------------------- 11:06 PM on 10/20/2017 -----------------------------------------  Awaiting result of MRI of complete spine.  Added on RPR as well for possible syphilis.  Doubt folate or B12 deficiency.  It is unclear whether this is a exacerbation of a chronic issue or a new issue given his medication list and the history of the patient offers of hyperreflexia.  If work-up negative I think patient can be discharged to follow-up with neurology outpatient.  Care signed out to Dr. Owens Shark.  ____________________________________________   FINAL CLINICAL IMPRESSION(S) / ED DIAGNOSES    Final diagnoses:  Tremor  Hyperreflexia of lower extremity     ED Discharge Orders    None      Portions of this note were generated with dragon dictation software. Dictation errors may occur despite best attempts at proofreading.    Carrie Mew, MD 10/20/17 [phone]

## 2017-10-21 MED ORDER — PREDNISONE 20 MG PO TABS: 40.0000 mg | ORAL_TABLET | Freq: Every day | ORAL | 0 refills | Status: AC

## 2017-10-21 NOTE — ED Notes (Signed)
EDP to bedside to provide patient update.

## 2017-10-22 LAB — RPR: RPR: NONREACTIVE

## 2017-11-19 DIAGNOSIS — R69 Illness, unspecified: Secondary | ICD-10-CM | POA: Diagnosis not present

## 2017-11-19 DIAGNOSIS — R269 Unspecified abnormalities of gait and mobility: Secondary | ICD-10-CM | POA: Diagnosis not present

## 2017-11-19 DIAGNOSIS — M5412 Radiculopathy, cervical region: Secondary | ICD-10-CM | POA: Diagnosis not present

## 2017-11-19 DIAGNOSIS — G629 Polyneuropathy, unspecified: Secondary | ICD-10-CM | POA: Diagnosis not present

## 2017-11-19 DIAGNOSIS — M1990 Unspecified osteoarthritis, unspecified site: Secondary | ICD-10-CM | POA: Diagnosis not present

## 2017-11-19 DIAGNOSIS — Z23 Encounter for immunization: Secondary | ICD-10-CM | POA: Diagnosis not present

## 2017-11-19 DIAGNOSIS — Z681 Body mass index (BMI) 19 or less, adult: Secondary | ICD-10-CM | POA: Diagnosis not present

## 2017-11-19 DIAGNOSIS — G959 Disease of spinal cord, unspecified: Secondary | ICD-10-CM | POA: Diagnosis not present

## 2017-11-30 ENCOUNTER — Ambulatory Visit: Payer: Medicare HMO | Admitting: Cardiology

## 2017-12-09 ENCOUNTER — Encounter: Payer: Self-pay | Admitting: Cardiology

## 2017-12-09 ENCOUNTER — Ambulatory Visit (INDEPENDENT_AMBULATORY_CARE_PROVIDER_SITE_OTHER): Payer: Medicare HMO | Admitting: Cardiology

## 2017-12-09 VITALS — BP 120/74 | HR 60 | Ht 67.0 in | Wt 118.1 lb

## 2017-12-09 DIAGNOSIS — E785 Hyperlipidemia, unspecified: Secondary | ICD-10-CM

## 2017-12-09 DIAGNOSIS — I951 Orthostatic hypotension: Secondary | ICD-10-CM

## 2017-12-09 DIAGNOSIS — R55 Syncope and collapse: Secondary | ICD-10-CM

## 2017-12-09 NOTE — Patient Instructions (Signed)
Your physician recommends that you continue on your current medications as directed. Please refer to the Current Medication list given to you today. Your physician recommends that you schedule a follow-up appointment AS NEEDED WITH DR. Marlou Porch.

## 2017-12-09 NOTE — Progress Notes (Signed)
Cardiology Office Note:    Date:  12/09/2017   ID:  Curtis Gentry, DOB Jun 29, 1945, MRN [id number]  PCP:  [name], MD  Cardiologist:  Candee Furbish, MD  Electrophysiologist:  None   Referring MD: [name], MD     History of Present Illness:    Curtis Gentry is a 72 y.o. male here for follow-up of recurrent syncope.  Last seen on 06/03/2017 by Truitt Merle, NP, orthostasis, previous spell in June 2018.  Echocardiogram event monitor reassuring.  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  He noted that he was in the emergency room in Christmas of 2018 with recurrent seizure-like activity because he had not been taking his meds.  At that prior visit on 06/03/2017 he was not having any further fainting spells.  No chest pain, no shortness of breath.  Not using support stockings.  Past Medical History:  Diagnosis Date  . Abnormality of gait 05/03/2015  . Anemia   . ANEMIA-UNSPECIFIED 09/06/2008   Qualifier: Diagnosis of  By: Henrene Pastor MD, Docia Chuck   . Anxiety   . Arthritis   . BPH (benign prostatic hyperplasia) 10/03/2016  . DYSPHAGIA 09/06/2008   Qualifier: Diagnosis of  By: Henrene Pastor MD, Docia Chuck   . Erectile dysfunction   . Hereditary and idiopathic peripheral neuropathy 05/03/2015  . Hyperlipidemia   . Hypokalemia 08/21/2011  . Peptic ulcer disease   . Perforated ulcer ([place])   . SDH (subdural hematoma) ([place]) 10/03/2016  . Slurred speech 10/03/2016  . 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 Syncope and collapse 08/20/2011  . Tobacco abuse 10/03/2016  . Unspecified disease of spinal cord    myelopahty, cervical spine    Past Surgical History:  Procedure Laterality Date  . BACK SURGERY  2011   3 level ACD with fusion and plating  . HEMORRHOID SURGERY    . PARTIAL GASTRECTOMY      Current Medications: Current Meds  Medication Sig  . aspirin EC 81 MG tablet Take 81 mg by mouth daily.  Marland Kitchen atorvastatin (LIPITOR) 10 MG tablet Take 10 mg by  mouth at bedtime.   . Cholecalciferol (VITAMIN D) 2000 units tablet Take 2,000 Units by mouth daily.  . diazepam (VALIUM) 5 MG tablet Take 5 mg as needed by mouth (EVERY 8-12 HOURS AS NEEDED FOR ANXIETY).   Marland Kitchen diclofenac sodium (VOLTAREN) 1 % GEL Apply 2 g 4 (four) times daily topically.  . gabapentin (NEURONTIN) 300 MG capsule Take 300 mg by mouth 3 (three) times daily as needed (nerve pain).   Marland Kitchen HYDROcodone-acetaminophen (NORCO/VICODIN) 5-325 MG tablet Take 1 tablet by mouth daily.  . Multiple Vitamin (MULTIVITAMIN WITH MINERALS) TABS tablet Take 1 tablet by mouth daily.  . Omega-3 Fatty Acids (FISH OIL PO) Take 1,200 mg by mouth 2 (two) times daily.   . pantoprazole (PROTONIX) 40 MG tablet Take 1 tablet by mouth daily.  Marland Kitchen senna-docusate (SENOKOT-S) 8.6-50 MG tablet Take 1 tablet by mouth at bedtime as needed for mild constipation.     Allergies:   Patient has no known allergies.   Social History   Socioeconomic History  . Marital status: Divorced    Spouse name: Not on file  . Number of children: 3  . Years of education: Not on file  . Highest education level: Not on file  Occupational History  . Occupation: retired  Scientific laboratory technician  . Financial resource strain: Not on file  . Food insecurity:  Worry: Not on file    Inability: Not on file  . Transportation needs:    Medical: Not on file    Non-medical: Not on file  Tobacco Use  . Smoking status: Current Every Day Smoker    Packs/day: 1.00    Years: 50.00    Pack years: 50.00  . Smokeless tobacco: Current User  Substance and Sexual Activity  . Alcohol use: No    Comment: 2-4 ounces alcohol per day  . Drug use: No  . Sexual activity: Never  Lifestyle  . Physical activity:    Days per week: Not on file    Minutes per session: Not on file  . Stress: Not on file  Relationships  . Social connections:    Talks on phone: Not on file    Gets together: Not on file    Attends religious service: Not on file    Active member  of club or organization: Not on file    Attends meetings of clubs or organizations: Not on file    Relationship status: Not on file  Other Topics Concern  . Not on file  Social History Narrative  . Not on file     Family History: The patient's family history includes CAD in his father and unknown relative; COPD in his father; Diabetes in his unknown relative; Heart attack in his father; Stroke in his mother. There is no history of Colon cancer or Neuropathy.  ROS:   Please see the history of present illness.     All other systems reviewed and are negative.  EKGs/Labs/Other Studies Reviewed:    The following studies were reviewed today: Prior office notes, lab work, EKG reviewed  EKG:  EKG is  ordered today.  The ekg ordered today demonstrates 12/09/2017-sinus rhythm 60 with increased peaking of T waves in V3 personally reviewed, no significant change from prior.  Recent Labs: 10/20/2017: BUN 12; Creatinine, Ser 0.90; Hemoglobin 12.2; Magnesium 2.0; Platelets 218; Potassium 4.1; Sodium 137  Recent Lipid Panel    Component Value Date/Time   CHOL 104 10/04/2016 0354   TRIG 52 10/04/2016 0354   HDL 36 (L) 10/04/2016 0354   CHOLHDL 2.9 10/04/2016 0354   VLDL 10 10/04/2016 0354   LDLCALC 58 10/04/2016 0354    Physical Exam:    VS:  BP 120/74   Pulse 60   Ht 5\' 7"  (1.702 m)   Wt 118 lb 1.9 oz (53.6 kg)   BMI 18.50 kg/m     Wt Readings from Last 3 Encounters:  12/09/17 118 lb 1.9 oz (53.6 kg)  10/20/17 118 lb (53.5 kg)  06/03/17 118 lb (53.5 kg)     GEN:  Well nourished, well developed in no acute distress HEENT: Normal NECK: No JVD; No carotid bruits LYMPHATICS: No lymphadenopathy CARDIAC: RRR, no murmurs, rubs, gallops RESPIRATORY:  Clear to auscultation without rales, wheezing or rhonchi  ABDOMEN: Soft, non-tender, non-distended MUSCULOSKELETAL:  No edema; No deformity  SKIN: Warm and dry NEUROLOGIC:  Alert and oriented x 3 PSYCHIATRIC:  Normal affect    ASSESSMENT:    1. Syncope, unspecified syncope type   2. Hyperlipidemia, unspecified hyperlipidemia type   3. Orthostatic hypotension    PLAN:    In order of problems listed above:  Prior history of recurrent syncope felt to be orthostatic hypotension - Negative echo and negative event monitor in 2018.  Doing well, infrequent, liberalize salt, hydrated.  He has not had a fainting episode in over 1 year.  I am happy to see him back on as-needed basis.  Tobacco use - Not ready to quit at this point.  We discussed again.  PRN follow-up  Medication Adjustments/Labs and Tests Ordered: Current medicines are reviewed at length with the patient today.  Concerns regarding medicines are outlined above.  Orders Placed This Encounter  Procedures  . EKG 12-Lead   No orders of the defined types were placed in this encounter.   Patient Instructions  Your physician recommends that you continue on your current medications as directed. Please refer to the Current Medication list given to you today. Your physician recommends that you schedule a follow-up appointment AS NEEDED WITH DR. Marlou Porch.     Signed, Candee Furbish, MD  12/09/2017 12:39 PM    [place]

## 2018-02-03 IMAGING — CT CT HEAD W/O CM
3 of 4 series · 13 of 47 positions shown, 15 images · non-contrast
Comparison: Head CT 01/14/2010

CLINICAL DATA: Left-sided facial droop with slurred speech

EXAM:
CT HEAD WITHOUT CONTRAST
TECHNIQUE: Contiguous axial images were obtained from the base of the skull
through the vertex without intravenous contrast.

[Series 3: head without · axial · non-contrast · 0.45mm/px · z∈[-605,-485]mm · 7 of 32 slices shown, 9 images]
[im 4/32  brain]
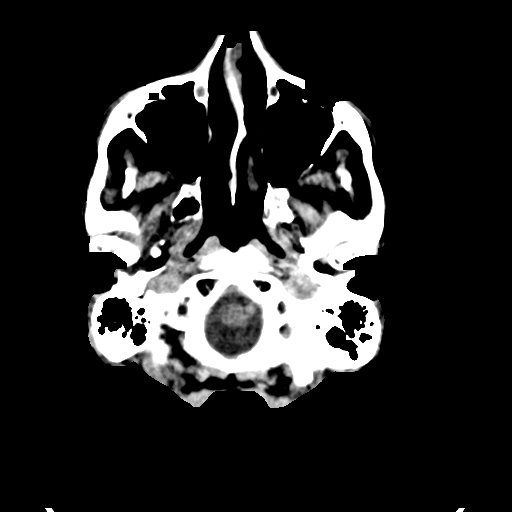
[im 4/32  bone]
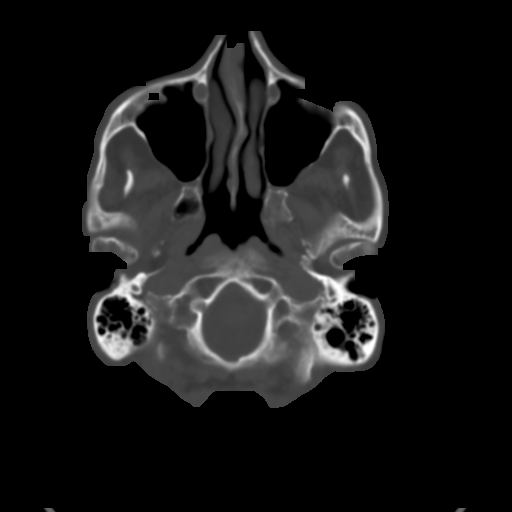
[im 8/32  brain]
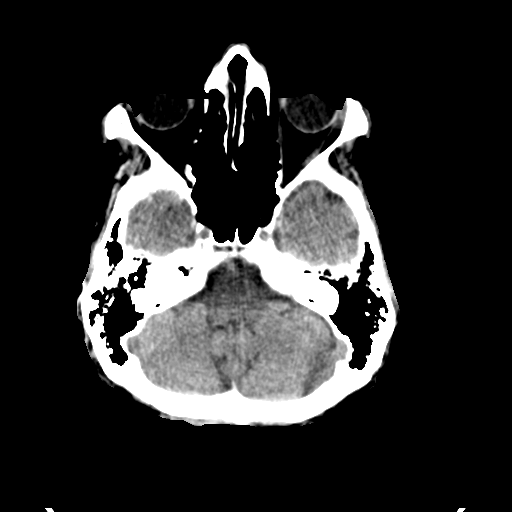
[im 12/32  brain]
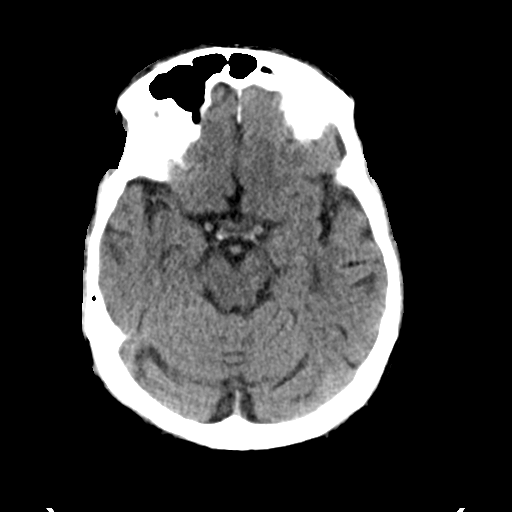
[im 16/32  brain]
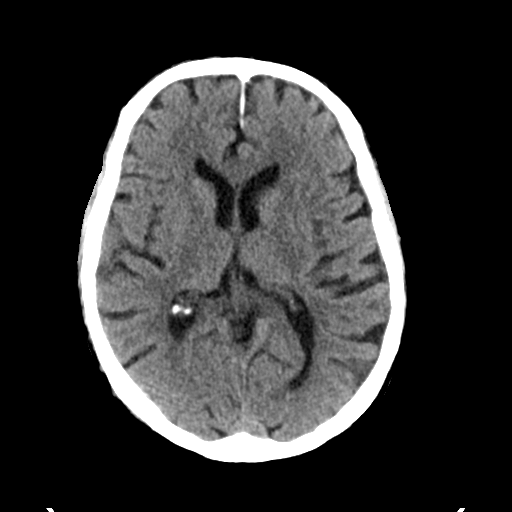
[im 20/32  brain]
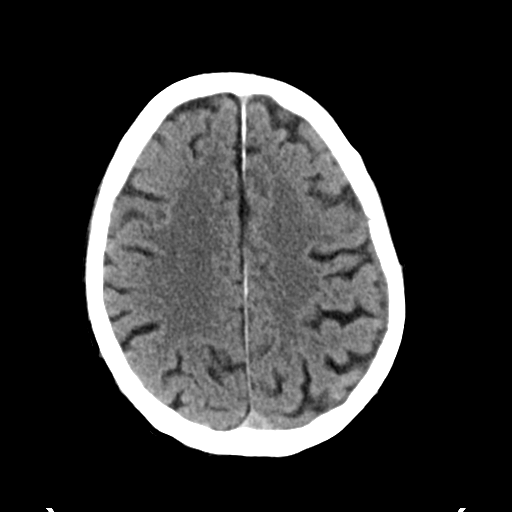
[im 20/32  bone]
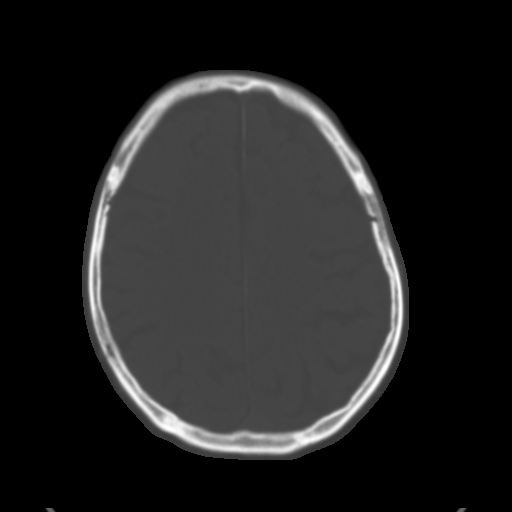
[im 24/32  brain]
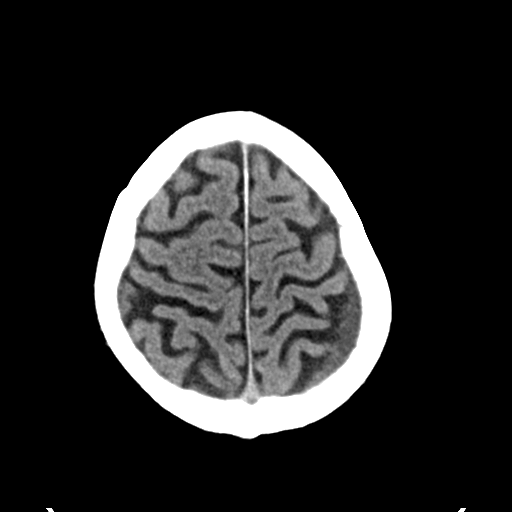
[im 28/32  brain]
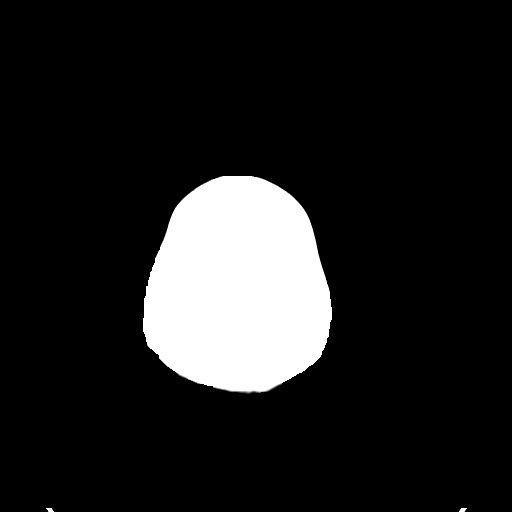

[Series 5: head without cor · coronal · non-contrast · 0.31mm/px · 3 of 74 slices shown]
[im 25/74  brain]
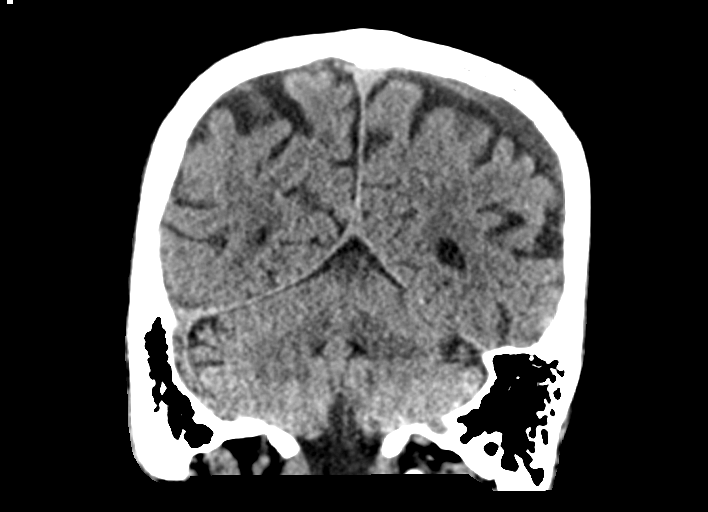
[im 33/74  brain]
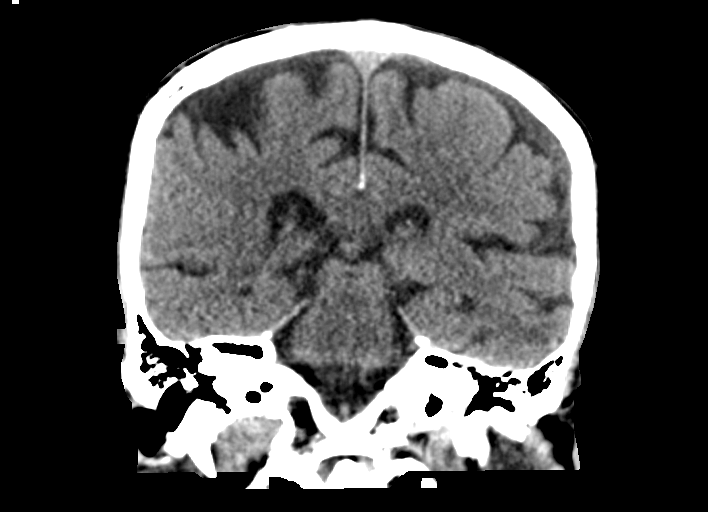
[im 41/74  brain]
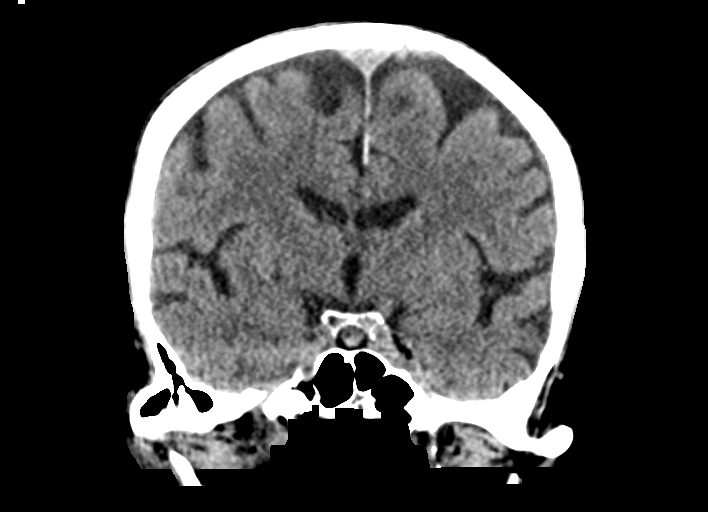

[Series 6: head without sag · sagittal · non-contrast · 0.31mm/px · 3 of 67 slices shown]
[im 23/67  brain]
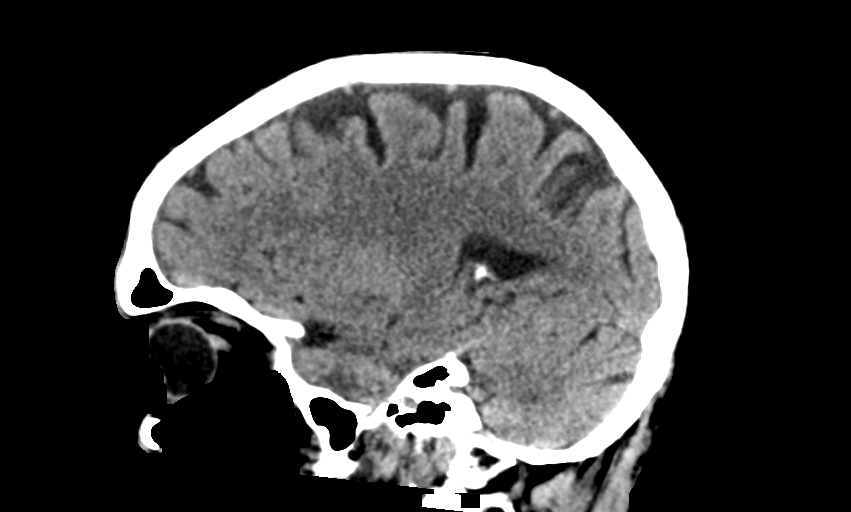
[im 34/67  brain]
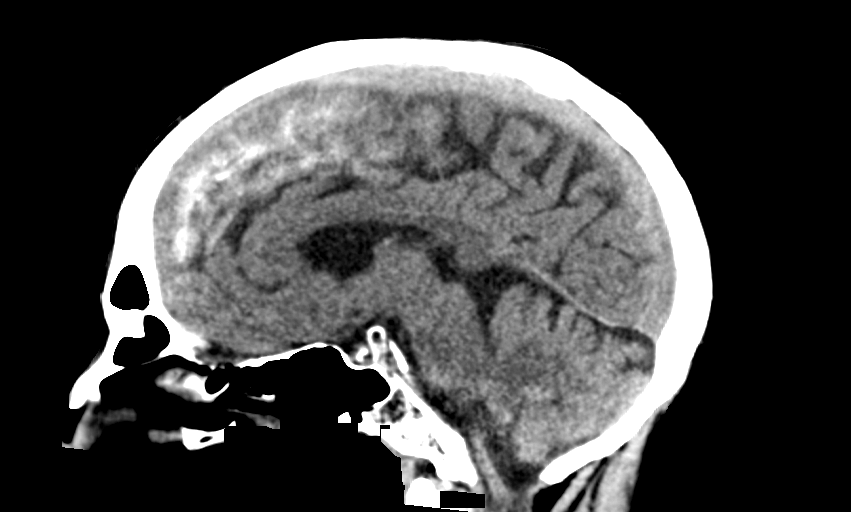
[im 45/67  brain]
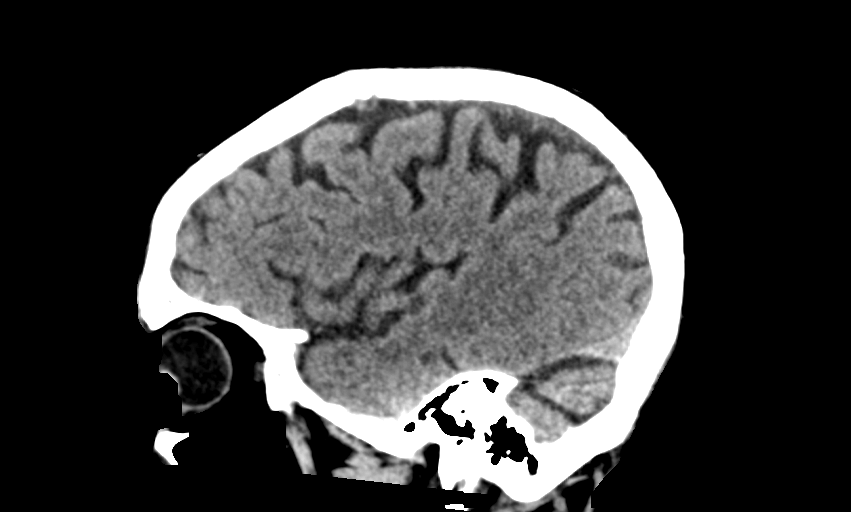

[13 of 47 positions shown; findings below may reference images not displayed]

FINDINGS: Brain: There is a hypodense collection over the left parietal
convexity measuring approximately 7 mm in thickness. No
intraparenchymal hemorrhage. No CT evidence of acute cortical
infarct. No midline shift or other mass effect. No hydrocephalus.
There is periventricular hypoattenuation compatible with chronic
microvascular disease. No evidence of acute cortical infarct.

Vascular: No hyperdense vessel or unexpected calcification.

Skull: Normal visualized skull base, calvarium and extracranial soft
tissues.

Sinuses/Orbits: No sinus fluid levels or advanced mucosal
thickening. No mastoid effusion. Normal orbits.
IMPRESSION: 1. Subacute to chronic left parietal convexity subdural hematoma
measuring 7 mm. No evidence of acute hemorrhage.
2. No associated mass effect.
3. Chronic microvascular ischemia without CT evidence of acute
infarct.
Critical Value/emergent results were called by telephone at the time
of interpretation on 10/03/2016 at [DATE] to Dr. ACHILLES MICHAUD , who
verbally acknowledged these results.

## 2018-02-23 DIAGNOSIS — H25813 Combined forms of age-related cataract, bilateral: Secondary | ICD-10-CM | POA: Diagnosis not present

## 2018-02-23 DIAGNOSIS — H353131 Nonexudative age-related macular degeneration, bilateral, early dry stage: Secondary | ICD-10-CM | POA: Diagnosis not present

## 2018-03-01 ENCOUNTER — Other Ambulatory Visit: Payer: Self-pay

## 2018-03-01 ENCOUNTER — Emergency Department (HOSPITAL_COMMUNITY)
Admission: EM | Admit: 2018-03-01 | Discharge: 2018-03-01 | Disposition: A | Discharge status: Home / Self Care | Payer: Medicare HMO | Attending: Emergency Medicine | Admitting: Emergency Medicine

## 2018-03-01 ENCOUNTER — Encounter (HOSPITAL_COMMUNITY): Payer: Self-pay | Admitting: Family Medicine

## 2018-03-01 DIAGNOSIS — Z79899 Other long term (current) drug therapy: Secondary | ICD-10-CM | POA: Insufficient documentation

## 2018-03-01 DIAGNOSIS — R69 Illness, unspecified: Secondary | ICD-10-CM | POA: Diagnosis not present

## 2018-03-01 DIAGNOSIS — H5710 Ocular pain, unspecified eye: Secondary | ICD-10-CM | POA: Diagnosis not present

## 2018-03-01 DIAGNOSIS — F17290 Nicotine dependence, other tobacco product, uncomplicated: Secondary | ICD-10-CM | POA: Diagnosis not present

## 2018-03-01 DIAGNOSIS — H25811 Combined forms of age-related cataract, right eye: Secondary | ICD-10-CM | POA: Diagnosis not present

## 2018-03-01 DIAGNOSIS — R52 Pain, unspecified: Secondary | ICD-10-CM | POA: Diagnosis not present

## 2018-03-01 DIAGNOSIS — Z7982 Long term (current) use of aspirin: Secondary | ICD-10-CM | POA: Insufficient documentation

## 2018-03-01 DIAGNOSIS — H5711 Ocular pain, right eye: Secondary | ICD-10-CM | POA: Diagnosis not present

## 2018-03-01 DIAGNOSIS — I959 Hypotension, unspecified: Secondary | ICD-10-CM | POA: Diagnosis not present

## 2018-03-01 DIAGNOSIS — H2511 Age-related nuclear cataract, right eye: Secondary | ICD-10-CM | POA: Diagnosis not present

## 2018-03-01 MED ORDER — ERYTHROMYCIN 5 MG/GM OP OINT
1.0000 | TOPICAL_OINTMENT | Freq: Once | OPHTHALMIC | Status: AC
Start: 2018-03-01 — End: 2018-03-01
  Administered 2018-03-01: 1 via OPHTHALMIC
  Filled 2018-03-01: qty 3.5

## 2018-03-01 MED ORDER — TETRACAINE HCL 0.5 % OP SOLN
1.0000 [drp] | Freq: Once | OPHTHALMIC | Status: AC
  Administered 2018-03-01: 1 [drp] via OPHTHALMIC
  Filled 2018-03-01: qty 4

## 2018-03-01 NOTE — ED Provider Notes (Signed)
[facility] DEPT Provider Note   CSN: [id number] Arrival date & time: 03/01/18  1711     History   Chief Complaint Chief Complaint  Patient presents with  . Eye Pain    HPI Curtis Gentry is a 73 y.o. male.  73 year old male with past medical history below who presents with right eye pain.  This morning the patient had cataract surgery on his right eye.  The procedure went normally and he was discharged home.  Once he got home he began having pain in his eyes so he went back to see the ophthalmologist again.  The ophthalmologist told him that his eye looked okay and he gave him antibiotic drops.  He also puts more numbing drops in which helped the pain but the pain returned when the patient got home again.  The pain is severe and intermittently stabbing.  He has had a lot of watering of his eye.  He has been using antibiotic drops without relief.  The history is provided by the patient.  Eye Pain     Past Medical History:  Diagnosis Date  . Abnormality of gait 05/03/2015  . Anemia   . ANEMIA-UNSPECIFIED 09/06/2008   Qualifier: Diagnosis of  By: Henrene Pastor MD, Docia Chuck   . Anxiety   . Arthritis   . BPH (benign prostatic hyperplasia) 10/03/2016  . DYSPHAGIA 09/06/2008   Qualifier: Diagnosis of  By: Henrene Pastor MD, Docia Chuck   . Erectile dysfunction   . Hereditary and idiopathic peripheral neuropathy 05/03/2015  . Hyperlipidemia   . Hypokalemia 08/21/2011  . Peptic ulcer disease   . Perforated ulcer ([place])   . SDH (subdural hematoma) ([place]) 10/03/2016  . Slurred speech 10/03/2016  . 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 Syncope and collapse 08/20/2011  . Tobacco abuse 10/03/2016  . Unspecified disease of spinal cord    myelopahty, cervical spine    Patient Active Problem List   Diagnosis Date Noted  . Tobacco abuse 10/03/2016  . BPH (benign prostatic hyperplasia) 10/03/2016  . SDH (subdural hematoma) ([place]) 10/03/2016  . Slurred speech 10/03/2016  .  Hyperlipidemia   . Anxiety   . 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 Abnormality of gait 05/03/2015  . Hereditary and idiopathic peripheral neuropathy 05/03/2015  . Syncope and collapse 08/20/2011  . ANEMIA-UNSPECIFIED 09/06/2008  . DYSPHAGIA 09/06/2008    Past Surgical History:  Procedure Laterality Date  . BACK SURGERY  2011   3 level ACD with fusion and plating  . HEMORRHOID SURGERY    . PARTIAL GASTRECTOMY          Home Medications    Prior to Admission medications   Medication Sig Start Date End Date Taking? Authorizing Provider  aspirin EC 81 MG tablet Take 81 mg by mouth daily.    [provider]  atorvastatin (LIPITOR) 10 MG tablet Take 10 mg by mouth at bedtime.     [provider]  Cholecalciferol (VITAMIN D) 2000 units tablet Take 2,000 Units by mouth daily.    [provider]  diazepam (VALIUM) 5 MG tablet Take 5 mg as needed by mouth (EVERY 8-12 HOURS AS NEEDED FOR ANXIETY).  04/29/16   [provider]  diclofenac sodium (VOLTAREN) 1 % GEL Apply 2 g 4 (four) times daily topically. 01/08/17   Volanda Napoleon, PA-C  gabapentin (NEURONTIN) 300 MG capsule Take 300 mg by mouth 3 (three) times daily as needed (nerve pain).     [provider]  HYDROcodone-acetaminophen (NORCO/VICODIN) 5-325 MG tablet Take 1 tablet by mouth daily. 11/19/17   [provider]  Multiple Vitamin (MULTIVITAMIN WITH MINERALS) TABS tablet Take 1 tablet by mouth daily.    [provider]  Omega-3 Fatty Acids (FISH OIL PO) Take 1,200 mg by mouth 2 (two) times daily.     [provider]  pantoprazole (PROTONIX) 40 MG tablet Take 1 tablet by mouth daily. 08/14/17   [provider]  senna-docusate (SENOKOT-S) 8.6-50 MG tablet Take 1 tablet by mouth at bedtime as needed for mild constipation. 10/04/16   Mariel Aloe, MD    Family History Family History  Problem Relation Age of Onset  . Stroke Mother   .  COPD Father        ?  Marland Kitchen Heart attack Father   . CAD Father   . Diabetes Other        family members  . CAD Other        family members  . Colon cancer Neg Hx   . Neuropathy Neg Hx     Social History Social History   Tobacco Use  . Smoking status: Current Every Day Smoker    Packs/day: 1.00    Years: 50.00    Pack years: 50.00  . Smokeless tobacco: Current User  Substance Use Topics  . Alcohol use: Not Currently  . Drug use: No     Allergies   Patient has no known allergies.   Review of Systems Review of Systems  Eyes: Positive for pain.   All other systems reviewed and are negative except that which was mentioned in HPI   Physical Exam Updated Vital Signs BP (!) 159/90   Pulse 69   Temp 97.9 F (36.6 C) (Oral)   Resp 15   SpO2 99%   Physical Exam Vitals signs and nursing note reviewed.  Constitutional:      Appearance: He is well-developed.     Comments: Uncomfortable, eyes closed  HENT:     Head: Normocephalic and atraumatic.  Eyes:     Conjunctiva/sclera: Conjunctivae normal.     Comments: Extremely limited exam 2/2 not cooperative; R eye scleral injection with tearing, no bleeding; rotated supralaterally and cannot visualize cornea/iris/pupil  Neck:     Musculoskeletal: Neck supple.  Skin:    General: Skin is warm and dry.  Neurological:     Mental Status: He is alert and oriented to person, place, and time.  Psychiatric:        Judgment: Judgment normal.      ED Treatments / Results  Labs (all labs ordered are listed, but only abnormal results are displayed) Labs Reviewed - No data to display  EKG None  Radiology No results found.  Procedures Procedures (including critical care time)  Medications Ordered in ED Medications  erythromycin ophthalmic ointment 1 application (1 application Right Eye Given 03/01/18 1856)  tetracaine (PONTOCAINE) 0.5 % ophthalmic solution 1 drop (1 drop Both Eyes Given 03/01/18 1839)     Initial  Impression / Assessment and Plan / ED Course  I have reviewed the triage vital signs and the nursing notes.      Spoke with ophthalmologist on call for Dr. Zenia Resides group. Per instructions, gave erythromycin ointment, tetracaine, and checked pressure which was 13-15, no evidence of increased IOP. PT more comfortable after tetracaine and erythromycin ointment. Will see Dr. Katy Fitch at 7:45am tomorrow. Return precautions given. Final Clinical Impressions(s) / ED Diagnoses   Final  diagnoses:  Pain in right eye    ED Discharge Orders    None       , Wenda Overland, MD 03/01/18 [phone]

## 2018-03-01 NOTE — ED Triage Notes (Signed)
Patient is from home and transported via [facility]. Patient had a cataract removed from right eye this morning. According to the daughter from EMS, the procedure was good. Patient complained of right eye pain when getting back home. Patient returned back to ophthalmologist. Additional numbing eye drops were administered and antibiotic eye drop was prescribed. EMS administered the first dose of antibiotic eye drops on scene. Patient describes pain as a stabbing to EMS. Patient is not wanting to open his eyes due to the pain and wearing dark sunglasses.

## 2018-03-16 DIAGNOSIS — H2512 Age-related nuclear cataract, left eye: Secondary | ICD-10-CM | POA: Diagnosis not present

## 2018-03-22 DIAGNOSIS — H2512 Age-related nuclear cataract, left eye: Secondary | ICD-10-CM | POA: Diagnosis not present

## 2018-03-22 DIAGNOSIS — H25812 Combined forms of age-related cataract, left eye: Secondary | ICD-10-CM | POA: Diagnosis not present

## 2018-04-16 DIAGNOSIS — Z681 Body mass index (BMI) 19 or less, adult: Secondary | ICD-10-CM | POA: Diagnosis not present

## 2018-04-16 DIAGNOSIS — R69 Illness, unspecified: Secondary | ICD-10-CM | POA: Diagnosis not present

## 2018-04-16 DIAGNOSIS — M5412 Radiculopathy, cervical region: Secondary | ICD-10-CM | POA: Diagnosis not present

## 2018-04-16 DIAGNOSIS — G959 Disease of spinal cord, unspecified: Secondary | ICD-10-CM | POA: Diagnosis not present

## 2018-04-16 DIAGNOSIS — Z79899 Other long term (current) drug therapy: Secondary | ICD-10-CM | POA: Diagnosis not present

## 2018-04-16 DIAGNOSIS — M1990 Unspecified osteoarthritis, unspecified site: Secondary | ICD-10-CM | POA: Diagnosis not present

## 2018-06-28 DIAGNOSIS — R739 Hyperglycemia, unspecified: Secondary | ICD-10-CM | POA: Diagnosis not present

## 2018-06-28 DIAGNOSIS — Z79899 Other long term (current) drug therapy: Secondary | ICD-10-CM | POA: Diagnosis not present

## 2018-06-28 DIAGNOSIS — R82998 Other abnormal findings in urine: Secondary | ICD-10-CM | POA: Diagnosis not present

## 2018-06-28 DIAGNOSIS — E7849 Other hyperlipidemia: Secondary | ICD-10-CM | POA: Diagnosis not present

## 2018-06-28 DIAGNOSIS — Z125 Encounter for screening for malignant neoplasm of prostate: Secondary | ICD-10-CM | POA: Diagnosis not present

## 2018-07-05 DIAGNOSIS — K635 Polyp of colon: Secondary | ICD-10-CM | POA: Diagnosis not present

## 2018-07-05 DIAGNOSIS — G459 Transient cerebral ischemic attack, unspecified: Secondary | ICD-10-CM | POA: Diagnosis not present

## 2018-07-05 DIAGNOSIS — I6200 Nontraumatic subdural hemorrhage, unspecified: Secondary | ICD-10-CM | POA: Diagnosis not present

## 2018-07-05 DIAGNOSIS — E785 Hyperlipidemia, unspecified: Secondary | ICD-10-CM | POA: Diagnosis not present

## 2018-07-05 DIAGNOSIS — R55 Syncope and collapse: Secondary | ICD-10-CM | POA: Diagnosis not present

## 2018-07-05 DIAGNOSIS — J449 Chronic obstructive pulmonary disease, unspecified: Secondary | ICD-10-CM | POA: Diagnosis not present

## 2018-07-05 DIAGNOSIS — Z0000 Encounter for general adult medical examination without abnormal findings: Secondary | ICD-10-CM | POA: Diagnosis not present

## 2018-07-05 DIAGNOSIS — R69 Illness, unspecified: Secondary | ICD-10-CM | POA: Diagnosis not present

## 2018-07-05 DIAGNOSIS — G629 Polyneuropathy, unspecified: Secondary | ICD-10-CM | POA: Diagnosis not present

## 2019-01-07 DIAGNOSIS — G629 Polyneuropathy, unspecified: Secondary | ICD-10-CM | POA: Diagnosis not present

## 2019-01-07 DIAGNOSIS — E785 Hyperlipidemia, unspecified: Secondary | ICD-10-CM | POA: Diagnosis not present

## 2019-01-07 DIAGNOSIS — G959 Disease of spinal cord, unspecified: Secondary | ICD-10-CM | POA: Diagnosis not present

## 2019-01-07 DIAGNOSIS — R55 Syncope and collapse: Secondary | ICD-10-CM | POA: Diagnosis not present

## 2019-01-07 DIAGNOSIS — J449 Chronic obstructive pulmonary disease, unspecified: Secondary | ICD-10-CM | POA: Diagnosis not present

## 2019-01-07 DIAGNOSIS — M5412 Radiculopathy, cervical region: Secondary | ICD-10-CM | POA: Diagnosis not present

## 2019-01-07 DIAGNOSIS — Z79899 Other long term (current) drug therapy: Secondary | ICD-10-CM | POA: Diagnosis not present

## 2019-01-07 DIAGNOSIS — I6200 Nontraumatic subdural hemorrhage, unspecified: Secondary | ICD-10-CM | POA: Diagnosis not present

## 2019-01-07 DIAGNOSIS — R69 Illness, unspecified: Secondary | ICD-10-CM | POA: Diagnosis not present

## 2019-05-09 DIAGNOSIS — Z961 Presence of intraocular lens: Secondary | ICD-10-CM | POA: Diagnosis not present

## 2019-05-09 DIAGNOSIS — H353131 Nonexudative age-related macular degeneration, bilateral, early dry stage: Secondary | ICD-10-CM | POA: Diagnosis not present

## 2019-07-08 DIAGNOSIS — Z0000 Encounter for general adult medical examination without abnormal findings: Secondary | ICD-10-CM | POA: Diagnosis not present

## 2019-07-08 DIAGNOSIS — Z125 Encounter for screening for malignant neoplasm of prostate: Secondary | ICD-10-CM | POA: Diagnosis not present

## 2019-07-08 DIAGNOSIS — E7849 Other hyperlipidemia: Secondary | ICD-10-CM | POA: Diagnosis not present

## 2019-07-08 DIAGNOSIS — R739 Hyperglycemia, unspecified: Secondary | ICD-10-CM | POA: Diagnosis not present

## 2019-07-15 ENCOUNTER — Other Ambulatory Visit: Payer: Self-pay | Admitting: Internal Medicine

## 2019-07-15 ENCOUNTER — Ambulatory Visit
Admission: RE | Admit: 2019-07-15 | Discharge: 2019-07-15 | Disposition: A | Discharge status: Home / Self Care | Payer: Medicare HMO | Source: Ambulatory Visit | Attending: Internal Medicine | Admitting: Internal Medicine

## 2019-07-15 ENCOUNTER — Other Ambulatory Visit: Payer: Self-pay

## 2019-07-15 DIAGNOSIS — I6200 Nontraumatic subdural hemorrhage, unspecified: Secondary | ICD-10-CM | POA: Diagnosis not present

## 2019-07-15 DIAGNOSIS — J90 Pleural effusion, not elsewhere classified: Secondary | ICD-10-CM

## 2019-07-15 DIAGNOSIS — M5412 Radiculopathy, cervical region: Secondary | ICD-10-CM | POA: Diagnosis not present

## 2019-07-15 DIAGNOSIS — E785 Hyperlipidemia, unspecified: Secondary | ICD-10-CM | POA: Diagnosis not present

## 2019-07-15 DIAGNOSIS — G959 Disease of spinal cord, unspecified: Secondary | ICD-10-CM | POA: Diagnosis not present

## 2019-07-15 DIAGNOSIS — R69 Illness, unspecified: Secondary | ICD-10-CM | POA: Diagnosis not present

## 2019-07-15 DIAGNOSIS — G629 Polyneuropathy, unspecified: Secondary | ICD-10-CM | POA: Diagnosis not present

## 2019-07-15 DIAGNOSIS — Z1212 Encounter for screening for malignant neoplasm of rectum: Secondary | ICD-10-CM | POA: Diagnosis not present

## 2019-07-15 DIAGNOSIS — J449 Chronic obstructive pulmonary disease, unspecified: Secondary | ICD-10-CM | POA: Diagnosis not present

## 2019-07-15 DIAGNOSIS — J439 Emphysema, unspecified: Secondary | ICD-10-CM | POA: Diagnosis not present

## 2019-07-15 DIAGNOSIS — Z1331 Encounter for screening for depression: Secondary | ICD-10-CM | POA: Diagnosis not present

## 2019-07-15 DIAGNOSIS — R82998 Other abnormal findings in urine: Secondary | ICD-10-CM | POA: Diagnosis not present

## 2019-07-15 DIAGNOSIS — J181 Lobar pneumonia, unspecified organism: Secondary | ICD-10-CM | POA: Diagnosis not present

## 2019-07-15 DIAGNOSIS — Z0000 Encounter for general adult medical examination without abnormal findings: Secondary | ICD-10-CM | POA: Diagnosis not present

## 2019-07-15 DIAGNOSIS — G459 Transient cerebral ischemic attack, unspecified: Secondary | ICD-10-CM | POA: Diagnosis not present

## 2019-08-01 DIAGNOSIS — J441 Chronic obstructive pulmonary disease with (acute) exacerbation: Secondary | ICD-10-CM | POA: Diagnosis not present

## 2019-08-01 DIAGNOSIS — R0902 Hypoxemia: Secondary | ICD-10-CM | POA: Diagnosis not present

## 2019-08-01 DIAGNOSIS — J90 Pleural effusion, not elsewhere classified: Secondary | ICD-10-CM | POA: Diagnosis not present

## 2019-08-01 DIAGNOSIS — R69 Illness, unspecified: Secondary | ICD-10-CM | POA: Diagnosis not present

## 2019-08-01 DIAGNOSIS — J449 Chronic obstructive pulmonary disease, unspecified: Secondary | ICD-10-CM | POA: Diagnosis not present

## 2019-08-31 DIAGNOSIS — J90 Pleural effusion, not elsewhere classified: Secondary | ICD-10-CM | POA: Diagnosis not present

## 2019-08-31 DIAGNOSIS — J441 Chronic obstructive pulmonary disease with (acute) exacerbation: Secondary | ICD-10-CM | POA: Diagnosis not present

## 2019-10-01 DIAGNOSIS — J90 Pleural effusion, not elsewhere classified: Secondary | ICD-10-CM | POA: Diagnosis not present

## 2019-10-01 DIAGNOSIS — J441 Chronic obstructive pulmonary disease with (acute) exacerbation: Secondary | ICD-10-CM | POA: Diagnosis not present

## 2019-10-14 ENCOUNTER — Encounter (HOSPITAL_COMMUNITY): Payer: Self-pay | Admitting: Internal Medicine

## 2019-10-14 ENCOUNTER — Other Ambulatory Visit: Payer: Self-pay

## 2019-10-14 ENCOUNTER — Emergency Department (HOSPITAL_COMMUNITY): Payer: Medicare HMO

## 2019-10-14 ENCOUNTER — Inpatient Hospital Stay (HOSPITAL_COMMUNITY)
Admission: EM | Admit: 2019-10-14 | Discharge: 2019-10-17 | DRG: 177 | Disposition: A | Discharge status: Home Health | Payer: Medicare HMO | Attending: Internal Medicine | Admitting: Internal Medicine

## 2019-10-14 DIAGNOSIS — Z209 Contact with and (suspected) exposure to unspecified communicable disease: Secondary | ICD-10-CM | POA: Diagnosis not present

## 2019-10-14 DIAGNOSIS — Z825 Family history of asthma and other chronic lower respiratory diseases: Secondary | ICD-10-CM

## 2019-10-14 DIAGNOSIS — G8929 Other chronic pain: Secondary | ICD-10-CM | POA: Diagnosis present

## 2019-10-14 DIAGNOSIS — J850 Gangrene and necrosis of lung: Secondary | ICD-10-CM | POA: Diagnosis not present

## 2019-10-14 DIAGNOSIS — N400 Enlarged prostate without lower urinary tract symptoms: Secondary | ICD-10-CM | POA: Diagnosis present

## 2019-10-14 DIAGNOSIS — M1990 Unspecified osteoarthritis, unspecified site: Secondary | ICD-10-CM | POA: Diagnosis present

## 2019-10-14 DIAGNOSIS — R918 Other nonspecific abnormal finding of lung field: Secondary | ICD-10-CM | POA: Diagnosis not present

## 2019-10-14 DIAGNOSIS — F419 Anxiety disorder, unspecified: Secondary | ICD-10-CM | POA: Diagnosis present

## 2019-10-14 DIAGNOSIS — R0602 Shortness of breath: Secondary | ICD-10-CM | POA: Diagnosis not present

## 2019-10-14 DIAGNOSIS — F17210 Nicotine dependence, cigarettes, uncomplicated: Secondary | ICD-10-CM | POA: Diagnosis present

## 2019-10-14 DIAGNOSIS — J439 Emphysema, unspecified: Secondary | ICD-10-CM | POA: Diagnosis not present

## 2019-10-14 DIAGNOSIS — Z903 Acquired absence of stomach [part of]: Secondary | ICD-10-CM

## 2019-10-14 DIAGNOSIS — Z20822 Contact with and (suspected) exposure to covid-19: Secondary | ICD-10-CM | POA: Diagnosis not present

## 2019-10-14 DIAGNOSIS — Z66 Do not resuscitate: Secondary | ICD-10-CM | POA: Diagnosis not present

## 2019-10-14 DIAGNOSIS — J432 Centrilobular emphysema: Secondary | ICD-10-CM | POA: Diagnosis not present

## 2019-10-14 DIAGNOSIS — J189 Pneumonia, unspecified organism: Secondary | ICD-10-CM | POA: Diagnosis not present

## 2019-10-14 DIAGNOSIS — Z720 Tobacco use: Secondary | ICD-10-CM | POA: Diagnosis present

## 2019-10-14 DIAGNOSIS — R0902 Hypoxemia: Secondary | ICD-10-CM

## 2019-10-14 DIAGNOSIS — N529 Male erectile dysfunction, unspecified: Secondary | ICD-10-CM | POA: Diagnosis present

## 2019-10-14 DIAGNOSIS — R627 Adult failure to thrive: Secondary | ICD-10-CM | POA: Diagnosis not present

## 2019-10-14 DIAGNOSIS — M4712 Other spondylosis with myelopathy, cervical region: Secondary | ICD-10-CM | POA: Diagnosis not present

## 2019-10-14 DIAGNOSIS — J449 Chronic obstructive pulmonary disease, unspecified: Secondary | ICD-10-CM | POA: Diagnosis not present

## 2019-10-14 DIAGNOSIS — J9601 Acute respiratory failure with hypoxia: Secondary | ICD-10-CM | POA: Diagnosis not present

## 2019-10-14 DIAGNOSIS — Z8711 Personal history of peptic ulcer disease: Secondary | ICD-10-CM | POA: Diagnosis not present

## 2019-10-14 DIAGNOSIS — G609 Hereditary and idiopathic neuropathy, unspecified: Secondary | ICD-10-CM | POA: Diagnosis present

## 2019-10-14 DIAGNOSIS — D649 Anemia, unspecified: Secondary | ICD-10-CM | POA: Diagnosis present

## 2019-10-14 DIAGNOSIS — Z681 Body mass index (BMI) 19 or less, adult: Secondary | ICD-10-CM

## 2019-10-14 DIAGNOSIS — E43 Unspecified severe protein-calorie malnutrition: Secondary | ICD-10-CM | POA: Diagnosis present

## 2019-10-14 DIAGNOSIS — Z981 Arthrodesis status: Secondary | ICD-10-CM

## 2019-10-14 DIAGNOSIS — J9621 Acute and chronic respiratory failure with hypoxia: Secondary | ICD-10-CM | POA: Diagnosis not present

## 2019-10-14 DIAGNOSIS — R0600 Dyspnea, unspecified: Secondary | ICD-10-CM | POA: Diagnosis not present

## 2019-10-14 DIAGNOSIS — Z9981 Dependence on supplemental oxygen: Secondary | ICD-10-CM

## 2019-10-14 DIAGNOSIS — J9600 Acute respiratory failure, unspecified whether with hypoxia or hypercapnia: Secondary | ICD-10-CM | POA: Diagnosis not present

## 2019-10-14 DIAGNOSIS — Z79899 Other long term (current) drug therapy: Secondary | ICD-10-CM | POA: Diagnosis not present

## 2019-10-14 DIAGNOSIS — E785 Hyperlipidemia, unspecified: Secondary | ICD-10-CM | POA: Diagnosis present

## 2019-10-14 DIAGNOSIS — I270 Primary pulmonary hypertension: Secondary | ICD-10-CM | POA: Diagnosis not present

## 2019-10-14 DIAGNOSIS — Z1152 Encounter for screening for COVID-19: Secondary | ICD-10-CM | POA: Diagnosis not present

## 2019-10-14 DIAGNOSIS — Z7982 Long term (current) use of aspirin: Secondary | ICD-10-CM | POA: Diagnosis not present

## 2019-10-14 DIAGNOSIS — I959 Hypotension, unspecified: Secondary | ICD-10-CM | POA: Diagnosis not present

## 2019-10-14 DIAGNOSIS — J181 Lobar pneumonia, unspecified organism: Secondary | ICD-10-CM | POA: Diagnosis not present

## 2019-10-14 DIAGNOSIS — R531 Weakness: Secondary | ICD-10-CM | POA: Diagnosis not present

## 2019-10-14 DIAGNOSIS — D72829 Elevated white blood cell count, unspecified: Secondary | ICD-10-CM | POA: Diagnosis not present

## 2019-10-14 DIAGNOSIS — I700 Atherosclerosis of aorta: Secondary | ICD-10-CM | POA: Diagnosis not present

## 2019-10-14 LAB — CBC WITH DIFFERENTIAL/PLATELET
Abs Immature Granulocytes: 0.06 10*3/uL (ref 0.00–0.07)
Basophils Absolute: 0.1 10*3/uL (ref 0.0–0.1)
Basophils Relative: 1 %
Eosinophils Absolute: 0.2 10*3/uL (ref 0.0–0.5)
Eosinophils Relative: 2 %
HCT: 40.4 % (ref 39.0–52.0)
Hemoglobin: 13.2 g/dL (ref 13.0–17.0)
Immature Granulocytes: 1 %
Lymphocytes Relative: 15 %
Lymphs Abs: 1.6 10*3/uL (ref 0.7–4.0)
MCH: 35.5 pg — ABNORMAL HIGH (ref 26.0–34.0)
MCHC: 32.7 g/dL (ref 30.0–36.0)
MCV: 108.6 fL — ABNORMAL HIGH (ref 80.0–100.0)
Monocytes Absolute: 0.8 10*3/uL (ref 0.1–1.0)
Monocytes Relative: 7 %
Neutro Abs: 8.1 10*3/uL — ABNORMAL HIGH (ref 1.7–7.7)
Neutrophils Relative %: 74 %
Platelets: 366 10*3/uL (ref 150–400)
RBC: 3.72 MIL/uL — ABNORMAL LOW (ref 4.22–5.81)
RDW: 14.4 % (ref 11.5–15.5)
WBC: 10.9 10*3/uL — ABNORMAL HIGH (ref 4.0–10.5)
nRBC: 0 % (ref 0.0–0.2)

## 2019-10-14 LAB — I-STAT ARTERIAL BLOOD GAS, ED
Acid-Base Excess: 0 mmol/L (ref 0.0–2.0)
Bicarbonate: 23.6 mmol/L (ref 20.0–28.0)
Calcium, Ion: 1.32 mmol/L (ref 1.15–1.40)
HCT: 32 % — ABNORMAL LOW (ref 39.0–52.0)
Hemoglobin: 10.9 g/dL — ABNORMAL LOW (ref 13.0–17.0)
O2 Saturation: 87 %
Potassium: 3.7 mmol/L (ref 3.5–5.1)
Sodium: 139 mmol/L (ref 135–145)
TCO2: 25 mmol/L (ref 22–32)
pCO2 arterial: 33.5 mmHg (ref 32.0–48.0)
pH, Arterial: 7.457 — ABNORMAL HIGH (ref 7.350–7.450)
pO2, Arterial: 49 mmHg — ABNORMAL LOW (ref 83.0–108.0)

## 2019-10-14 LAB — COMPREHENSIVE METABOLIC PANEL
ALT: 20 U/L (ref 0–44)
AST: 26 U/L (ref 15–41)
Albumin: 1.9 g/dL — ABNORMAL LOW (ref 3.5–5.0)
Alkaline Phosphatase: 120 U/L (ref 38–126)
Anion gap: 8 (ref 5–15)
BUN: 26 mg/dL — ABNORMAL HIGH (ref 8–23)
CO2: 22 mmol/L (ref 22–32)
Calcium: 8.7 mg/dL — ABNORMAL LOW (ref 8.9–10.3)
Chloride: 109 mmol/L (ref 98–111)
Creatinine, Ser: 0.93 mg/dL (ref 0.61–1.24)
GFR calc Af Amer: >60 mL/min (ref >60)
GFR calc non Af Amer: >60 mL/min (ref >60)
Glucose, Bld: 110 mg/dL — ABNORMAL HIGH (ref 70–99)
Potassium: 4.3 mmol/L (ref 3.5–5.1)
Sodium: 139 mmol/L (ref 135–145)
Total Bilirubin: 0.5 mg/dL (ref 0.3–1.2)
Total Protein: 5.2 g/dL — ABNORMAL LOW (ref 6.5–8.1)

## 2019-10-14 LAB — LACTIC ACID, PLASMA
Lactic Acid, Venous: 2.7 mmol/L (ref 0.5–1.9)
Lactic Acid, Venous: 1.9 mmol/L (ref 0.5–1.9)

## 2019-10-14 LAB — TROPONIN I (HIGH SENSITIVITY)
Troponin I (High Sensitivity): 5 ng/L (ref <18)
Troponin I (High Sensitivity): 8 ng/L (ref <18)

## 2019-10-14 LAB — BRAIN NATRIURETIC PEPTIDE: B Natriuretic Peptide: 107.3 pg/mL — ABNORMAL HIGH (ref 0.0–100.0)

## 2019-10-14 LAB — SARS CORONAVIRUS 2 BY RT PCR (HOSPITAL ORDER, PERFORMED IN ~~LOC~~ HOSPITAL LAB): SARS Coronavirus 2: NEGATIVE

## 2019-10-14 MED ORDER — VANCOMYCIN HCL IN DEXTROSE 1-5 GM/200ML-% IV SOLN
1000.0000 mg | Freq: Once | INTRAVENOUS | Status: AC
  Administered 2019-10-14: 1000 mg via INTRAVENOUS
  Filled 2019-10-14: qty 200

## 2019-10-14 MED ORDER — HEPARIN SODIUM (PORCINE) 5000 UNIT/ML IJ SOLN
5000.0000 [IU] | Freq: Three times a day (TID) | INTRAMUSCULAR | Status: DC
  Administered 2019-10-15 – 2019-10-17 (×7): 5000 [IU] via SUBCUTANEOUS
  Filled 2019-10-14 (×6): qty 1

## 2019-10-14 MED ORDER — SODIUM CHLORIDE 0.9 % IV BOLUS
500.0000 mL | Freq: Once | INTRAVENOUS | Status: AC
  Administered 2019-10-14: 500 mL via INTRAVENOUS

## 2019-10-14 MED ORDER — HYDROCODONE-ACETAMINOPHEN 5-325 MG PO TABS
1.0000 | ORAL_TABLET | Freq: Every day | ORAL | Status: DC | PRN
  Administered 2019-10-16: 1 via ORAL
  Filled 2019-10-14: qty 1

## 2019-10-14 MED ORDER — PANTOPRAZOLE SODIUM 40 MG PO TBEC
40.0000 mg | DELAYED_RELEASE_TABLET | Freq: Every day | ORAL | Status: DC
  Administered 2019-10-15 – 2019-10-17 (×3): 40 mg via ORAL
  Filled 2019-10-14 (×3): qty 1

## 2019-10-14 MED ORDER — DIAZEPAM 5 MG PO TABS
2.5000 mg | ORAL_TABLET | ORAL | Status: DC | PRN
  Administered 2019-10-15: 5 mg via ORAL
  Filled 2019-10-14: qty 1

## 2019-10-14 MED ORDER — VITAMIN D 25 MCG (1000 UNIT) PO TABS
2000.0000 [IU] | ORAL_TABLET | Freq: Every day | ORAL | Status: DC
  Administered 2019-10-15 – 2019-10-17 (×3): 2000 [IU] via ORAL
  Filled 2019-10-14 (×3): qty 2

## 2019-10-14 MED ORDER — ATORVASTATIN CALCIUM 10 MG PO TABS
10.0000 mg | ORAL_TABLET | Freq: Every day | ORAL | Status: DC
  Administered 2019-10-15 – 2019-10-16 (×2): 10 mg via ORAL
  Filled 2019-10-14 (×2): qty 1

## 2019-10-14 MED ORDER — GABAPENTIN 300 MG PO CAPS: 300.0000 mg | ORAL_CAPSULE | Freq: Two times a day (BID) | ORAL | Status: DC | PRN

## 2019-10-14 MED ORDER — VANCOMYCIN HCL 500 MG/100ML IV SOLN
500.0000 mg | Freq: Two times a day (BID) | INTRAVENOUS | Status: DC
  Administered 2019-10-15 (×2): 500 mg via INTRAVENOUS
  Filled 2019-10-14 (×3): qty 100

## 2019-10-14 MED ORDER — ONDANSETRON HCL 4 MG/2ML IJ SOLN: 4.0000 mg | Freq: Four times a day (QID) | INTRAMUSCULAR | Status: DC | PRN

## 2019-10-14 MED ORDER — IOHEXOL 350 MG/ML SOLN
100.0000 mL | Freq: Once | INTRAVENOUS | Status: AC | PRN
  Administered 2019-10-14: 100 mL via INTRAVENOUS

## 2019-10-14 MED ORDER — ASPIRIN EC 81 MG PO TBEC
81.0000 mg | DELAYED_RELEASE_TABLET | Freq: Every day | ORAL | Status: DC
  Administered 2019-10-15 – 2019-10-17 (×3): 81 mg via ORAL
  Filled 2019-10-14 (×3): qty 1

## 2019-10-14 MED ORDER — ONDANSETRON HCL 4 MG PO TABS: 4.0000 mg | ORAL_TABLET | Freq: Four times a day (QID) | ORAL | Status: DC | PRN

## 2019-10-14 MED ORDER — OMEGA-3-ACID ETHYL ESTERS 1 G PO CAPS
1.0000 g | ORAL_CAPSULE | Freq: Two times a day (BID) | ORAL | Status: DC
  Administered 2019-10-15 – 2019-10-17 (×5): 1 g via ORAL
  Filled 2019-10-14 (×5): qty 1

## 2019-10-14 MED ORDER — SODIUM CHLORIDE 0.9 % IV SOLN
2.0000 g | Freq: Two times a day (BID) | INTRAVENOUS | Status: DC
  Administered 2019-10-14: 2 g via INTRAVENOUS
  Filled 2019-10-14 (×2): qty 2

## 2019-10-14 MED ORDER — SENNOSIDES-DOCUSATE SODIUM 8.6-50 MG PO TABS: 1.0000 | ORAL_TABLET | Freq: Every evening | ORAL | Status: DC | PRN

## 2019-10-14 NOTE — ED Provider Notes (Signed)
[facility] EMERGENCY DEPARTMENT Provider Note   CSN: [id number] Arrival date & time: 10/14/19  1659     History Chief Complaint  Patient presents with   Failure To Thrive   Shortness of Breath    Curtis Gentry is a 74 y.o. male.  74 year old male with prior medical history detailed below presents for evaluation of hypoxia and shortness of breath.  Patient arrives from his primary care provider's office.  Per EMS report, the patient was noted to be significantly hypoxic.  Patient does have a chronic O2 requirement of 2.5 L per nasal cannula.  EMS has placed the patient on 9 L nonrebreather mask with noted improvement in pulse ox.  Patient appears to be comfortable at this time.  He denies feeling short of breath.  He denies chest pain.  He denies fever.  His provided history is somewhat bleeding.  He reports possible work-up in progress for possible lung cancer.  He also reports possible current treatment for pneumonia.  He cannot provide details as to what antibiotic he might be on.    The history is provided by the patient, the EMS personnel and medical records.  Shortness of Breath Severity:  Moderate Onset quality:  Gradual Duration:  2 weeks Timing:  Constant Progression:  Worsening Chronicity:  New Relieved by:  Nothing Worsened by:  Nothing      Past Medical History:  Diagnosis Date   Abnormality of gait 05/03/2015   Anemia    ANEMIA-UNSPECIFIED 09/06/2008   Qualifier: Diagnosis of  By: Henrene Pastor MD, Docia Chuck    Anxiety    Arthritis    BPH (benign prostatic hyperplasia) 10/03/2016   DYSPHAGIA 09/06/2008   Qualifier: Diagnosis of  By: Henrene Pastor MD, Docia Chuck    Erectile dysfunction    Hereditary and idiopathic peripheral neuropathy 05/03/2015   Hyperlipidemia    Hypokalemia 08/21/2011   Peptic ulcer disease    Perforated ulcer ([place])    SDH (subdural hematoma) ([place]) 10/03/2016   Slurred speech 10/03/2016   Spondylosis, cervical, with  myelopathy 05/03/2015   Syncope and collapse 08/20/2011   Tobacco abuse 10/03/2016   Unspecified disease of spinal cord    myelopahty, cervical spine    Patient Active Problem List   Diagnosis Date Noted   Tobacco abuse 10/03/2016   BPH (benign prostatic hyperplasia) 10/03/2016   SDH (subdural hematoma) ([place]) 10/03/2016   Slurred speech 10/03/2016   Hyperlipidemia    Anxiety    Spondylosis, cervical, with myelopathy 05/03/2015   Abnormality of gait 05/03/2015   Hereditary and idiopathic peripheral neuropathy 05/03/2015   Syncope and collapse 08/20/2011   ANEMIA-UNSPECIFIED 09/06/2008   DYSPHAGIA 09/06/2008    Past Surgical History:  Procedure Laterality Date   BACK SURGERY  2011   3 level ACD with fusion and plating   HEMORRHOID SURGERY     PARTIAL GASTRECTOMY         Family History  Problem Relation Age of Onset   Stroke Mother    COPD Father        ?   Heart attack Father    CAD Father    Diabetes Other        family members   CAD Other        family members   Colon cancer Neg Hx    Neuropathy Neg Hx     Social History   Tobacco Use   Smoking status: Current Every Day Smoker    Packs/day: 1.00  Years: 50.00    Pack years: 50.00   Smokeless tobacco: Current User  Vaping Use   Vaping Use: Never used  Substance Use Topics   Alcohol use: Not Currently   Drug use: No    Home Medications Prior to Admission medications   Medication Sig Start Date End Date Taking? Authorizing Provider  ANORO ELLIPTA 62.5-25 MCG/INH AEPB Inhale 1 puff into the lungs daily. 09/28/19  Yes [provider]  aspirin EC 81 MG tablet Take 81 mg by mouth daily.   Yes [provider]  atorvastatin (LIPITOR) 10 MG tablet Take 10 mg by mouth at bedtime.    Yes [provider]  Cholecalciferol (VITAMIN D) 2000 units tablet Take 2,000 Units by mouth daily.   Yes [provider]  diazepam (VALIUM) 5 MG tablet Take 2.5-5 mg  by mouth as needed for anxiety (EVERY 8-12 HOURS AS NEEDED FOR ANXIETY).  04/29/16  Yes [provider]  diclofenac sodium (VOLTAREN) 1 % GEL Apply 2 g 4 (four) times daily topically. Patient taking differently: Apply 2 g topically 4 (four) times daily as needed (pain).  01/08/17  Yes Providence Lanius A, PA-C  gabapentin (NEURONTIN) 300 MG capsule Take 300-600 mg by mouth 2 (two) times daily as needed (nerve pain). 300mg  in AM and 600mg  QHS   Yes [provider]  HYDROcodone-acetaminophen (NORCO/VICODIN) 5-325 MG tablet Take 1 tablet by mouth daily as needed for moderate pain.  11/19/17  Yes [provider]  ibuprofen (ADVIL) 200 MG tablet Take 200-400 mg by mouth every 6 (six) hours as needed for fever or moderate pain.   Yes [provider]  Multiple Vitamin (MULTIVITAMIN WITH MINERALS) TABS tablet Take 1 tablet by mouth daily.   Yes [provider]  naproxen sodium (ALEVE) 220 MG tablet Take 220 mg by mouth 2 (two) times daily as needed (pain).   Yes [provider]  Omega-3 Fatty Acids (FISH OIL PO) Take 1,200 mg by mouth 2 (two) times daily.    Yes [provider]  pantoprazole (PROTONIX) 40 MG tablet Take 1 tablet by mouth daily. 08/14/17  Yes [provider]  senna-docusate (SENOKOT-S) 8.6-50 MG tablet Take 1 tablet by mouth at bedtime as needed for mild constipation. 10/04/16  Yes Mariel Aloe, MD    Allergies    Patient has no known allergies.  Review of Systems   Review of Systems  Respiratory: Positive for shortness of breath.   All other systems reviewed and are negative.   Physical Exam Updated Vital Signs BP 122/87    Pulse 62    Temp (!) 97.4 F (36.3 C) (Oral)    Resp (!) 21    Ht 5\' 8"  (1.727 m)    Wt 54.4 kg    SpO2 95%    BMI 18.25 kg/m   Physical Exam Vitals and nursing note reviewed.  Constitutional:      General: He is not in acute distress.    Appearance: He is well-developed.  HENT:      Head: Normocephalic and atraumatic.  Eyes:     Conjunctiva/sclera: Conjunctivae normal.     Pupils: Pupils are equal, round, and reactive to light.  Cardiovascular:     Rate and Rhythm: Normal rate and regular rhythm.     Heart sounds: Normal heart sounds.  Pulmonary:     Effort: Pulmonary effort is normal. No respiratory distress.     Comments: Decreased BS at bilateral bases   Abdominal:  General: There is no distension.     Palpations: Abdomen is soft.     Tenderness: There is no abdominal tenderness.  Musculoskeletal:        General: No deformity. Normal range of motion.     Cervical back: Normal range of motion and neck supple.  Skin:    General: Skin is warm and dry.  Neurological:     Mental Status: He is alert and oriented to person, place, and time.     ED Results / Procedures / Treatments   Labs (all labs ordered are listed, but only abnormal results are displayed) Labs Reviewed  COMPREHENSIVE METABOLIC PANEL - Abnormal; Notable for the following components:      Result Value   Glucose, Bld 110 (*)    BUN 26 (*)    Calcium 8.7 (*)    Total Protein 5.2 (*)    Albumin 1.9 (*)    All other components within normal limits  CBC WITH DIFFERENTIAL/PLATELET - Abnormal; Notable for the following components:   WBC 10.9 (*)    RBC 3.72 (*)    MCV 108.6 (*)    MCH 35.5 (*)    Neutro Abs 8.1 (*)    All other components within normal limits  LACTIC ACID, PLASMA - Abnormal; Notable for the following components:   Lactic Acid, Venous 2.7 (*)    All other components within normal limits  BRAIN NATRIURETIC PEPTIDE - Abnormal; Notable for the following components:   B Natriuretic Peptide 107.3 (*)    All other components within normal limits  I-STAT ARTERIAL BLOOD GAS, ED - Abnormal; Notable for the following components:   pH, Arterial 7.457 (*)    pO2, Arterial 49 (*)    HCT 32.0 (*)    Hemoglobin 10.9 (*)    All other components within normal limits  SARS  CORONAVIRUS 2 BY RT PCR (HOSPITAL ORDER, [place] [facility])  CULTURE, BLOOD (ROUTINE X 2)  CULTURE, BLOOD (ROUTINE X 2)  LACTIC ACID, PLASMA  URINALYSIS, ROUTINE W REFLEX MICROSCOPIC  TROPONIN I (HIGH SENSITIVITY)  TROPONIN I (HIGH SENSITIVITY)    EKG EKG Interpretation  Date/Time:  Friday October 14 2019 17:31:46 EDT Ventricular Rate:  64 PR Interval:    QRS Duration: 102 QT Interval:  445 QTC Calculation: 460 R Axis:   61 Text Interpretation: Sinus rhythm Confirmed by Dene Gentry [phone]) on 10/14/2019 5:33:31 PM   Radiology CT Angio Chest PE W and/or Wo Contrast  Result Date: 10/14/2019 CLINICAL DATA:  Pulmonary embolism, dyspnea, hypoxia EXAM: CT ANGIOGRAPHY CHEST WITH CONTRAST TECHNIQUE: Multidetector CT imaging of the chest was performed using the standard protocol during bolus administration of intravenous contrast. Multiplanar CT image reconstructions and MIPs were obtained to evaluate the vascular anatomy. CONTRAST:  150mL OMNIPAQUE IOHEXOL 350 MG/ML SOLN COMPARISON:  None. FINDINGS: Cardiovascular: There is excellent opacification of the pulmonary arterial tree. The central pulmonary arteries are enlarged in keeping with changes of pulmonary arterial hypertension. There is, however, no intraluminal filling defect identified to suggest acute pulmonary embolism. There is mild global cardiomegaly with particular enlargement of the right ventricle and right atrium in keeping with elevated right heart pressure. Moderate multi-vessel coronary artery calcification. No pericardial effusion. Mild scattered atherosclerotic calcification is seen within the thoracic aorta, which is of normal caliber. Mediastinum/Nodes: There is shotty right hilar and subcarinal lymphadenopathy which may be reactive in nature. No frankly pathologic thoracic adenopathy. Thyroid gland unremarkable. Esophagus unremarkable. Lungs/Pleura: There is severe centrilobular  emphysema again identified.  Spiculated mass within the right apex demonstrates interval increase in size measuring 18 x 20 mm at axial image # 27/8 there is dense consolidation of the right lower lobe with areas of cavitation and hypoenhancement in keeping with areas of necrosis no discrete drainable fluid collection identified. There is consolidation also noted within the lateral segment of the right middle lobe, though to a lesser degree. There is convex expansion of the right major pleura raising the question of Klebsiella pneumonia as an underlying etiology. Upper Abdomen: No acute abnormality. Musculoskeletal: No chest wall abnormality. No acute or significant osseous findings. Review of the MIP images confirms the above findings. IMPRESSION: 1. No evidence of acute pulmonary embolism. 2. Interval increase in size of spiculated mass within the right apex, now measuring 18 x 20 mm, suspicious for a primary bronchogenic malignancy. This may be amenable to tissue sampling by navigational bronchoscopy. 3. Right lower lobe necrotizing pneumonia. No discrete drainable fluid collection. Convex expansion of the right major pleura raise the question of a Klebsiella as an underlying organism. Milder consolidation within the right middle lobe. 4. Severe centrilobular emphysema. 5. Enlarged central pulmonary arteries and right heart in keeping with changes of pulmonary arterial hypertension and elevated right heart pressure. 6. Emphysema and aortic atherosclerosis. Aortic Atherosclerosis (ICD10-I70.0) and Emphysema (ICD10-J43.9). Electronically Signed   By: Fidela Salisbury MD   On: 10/14/2019 21:53   DG Chest Port 1 View  Result Date: 10/14/2019 CLINICAL DATA:  Dyspnea, short of breath EXAM: PORTABLE CHEST 1 VIEW COMPARISON:  01/07/2017, CT 07/15/2019 FINDINGS: Hyperinflation with emphysematous disease. Consolidation in the right lower lobe, appears progressed since chest CT from May. Right paratracheal opacity presumably corresponds to the  spiculated density on CT. Stable cardiomediastinal silhouette. No pneumothorax. Wirelike density at the right thoracic inlet for which clinical correlation is recommended. IMPRESSION: 1. Increased consolidation and airspace disease in the right lower lobe compared to CT from May 2021 2. Emphysematous disease. 3. Right medial apical ill-defined opacity, likely corresponds to the spiculated density on CT, potentially representing scar or neoplasm. Electronically Signed   By: Donavan Foil M.D.   On: 10/14/2019 18:02    Procedures Procedures (including critical care time)  Medications Ordered in ED Medications  vancomycin (VANCOCIN) IVPB 1000 mg/200 mL premix (has no administration in time range)  ceFEPIme (MAXIPIME) 2 g in sodium chloride 0.9 % 100 mL IVPB (has no administration in time range)  vancomycin (VANCOREADY) IVPB 500 mg/100 mL (has no administration in time range)  sodium chloride 0.9 % bolus 500 mL (0 mLs Intravenous Stopped 10/14/19 1942)  iohexol (OMNIPAQUE) 350 MG/ML injection 100 mL (100 mLs Intravenous Contrast Given 10/14/19 2123)    ED Course  I have reviewed the triage vital signs and the nursing notes.  Pertinent labs & imaging results that were available during my care of the patient were reviewed by me and considered in my medical decision making (see chart for details).    MDM Rules/Calculators/A&P                          MDM  Screen complete  TIWAN SCHNITKER was evaluated in Emergency Department on 10/14/2019 for the symptoms described in the history of present illness. He was evaluated in the context of the global COVID-19 pandemic, which necessitated consideration that the patient might be at risk for infection with the SARS-CoV-2 virus that causes COVID-19. Institutional protocols and algorithms that pertain  to the evaluation of patients at risk for COVID-19 are in a state of rapid change based on information released by regulatory bodies including the CDC and federal  and state organizations. These policies and algorithms were followed during the patient's care in the ED.  Patient is presenting for worsening hypoxia in the setting of possible failed outpatient treatment of pneumonia.  Patient also has concurrent recent history of possible lung cancer.  This diagnosis has not been fully worked-up.  Imaging studies reveal evidence of right-sided pneumonia.  Patient will require admission.  Hospitalist service contacted regarding case and will evaluate for admission.  Patient and family at bedside understand plan of care.    Final Clinical Impression(s) / ED Diagnoses Final diagnoses:  Dyspnea, unspecified type  Hypoxia    Rx / DC Orders ED Discharge Orders    None       Valarie Merino, MD 10/14/19 2208

## 2019-10-14 NOTE — H&P (Signed)
History and Physical    Curtis Gentry SFK:[id number] DOB: 06-Feb-1946 DOA: 10/14/2019  PCP: [name], MD  Patient coming from: Home.  Chief Complaint: Shortness of breath.  HPI: Curtis Gentry is a 74 y.o. male with history of tobacco abuse, hyperlipidemia, recurrent syncope and possible COPD was brought to the ER after patient became more short of breath.  Patient states he has been short of breath for last few weeks and was treated for pneumonia by his primary care physician.  Despite which he was getting more short of breath EMS was called and was brought to the ER.  Initially patient was on nonrebreather.  Denies chest pain.  Has benign productive cough.  Has been having poor appetite and has lost weight recently.  ED Course: In the ER initially patient was on nonrebreather then slowly weaned off to 4 L oxygen presently.  CT angiogram of the chest shows increasing size of the right sided spiculated mass concerning for bronchogenic carcinoma and also there was which is concerning for necrotizing pneumonia.  Blood cultures were obtained and started on empiric antibiotics.  Covid test was negative.  Labs show microcytic picture.  Very low albumin levels.  EKG shows normal sinus rhythm with nonspecific T wave changes.  Review of Systems: As per HPI, rest all negative.   Past Medical History:  Diagnosis Date  . Abnormality of gait 05/03/2015  . Anemia   . ANEMIA-UNSPECIFIED 09/06/2008   Qualifier: Diagnosis of  By: Henrene Pastor MD, Docia Chuck   . Anxiety   . Arthritis   . BPH (benign prostatic hyperplasia) 10/03/2016  . DYSPHAGIA 09/06/2008   Qualifier: Diagnosis of  By: Henrene Pastor MD, Docia Chuck   . Erectile dysfunction   . Hereditary and idiopathic peripheral neuropathy 05/03/2015  . Hyperlipidemia   . Hypokalemia 08/21/2011  . Peptic ulcer disease   . Perforated ulcer ([place])   . SDH (subdural hematoma) ([place]) 10/03/2016  . Slurred speech 10/03/2016  . 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  Syncope and collapse 08/20/2011  . Tobacco abuse 10/03/2016  . Unspecified disease of spinal cord    myelopahty, cervical spine    Past Surgical History:  Procedure Laterality Date  . BACK SURGERY  2011   3 level ACD with fusion and plating  . HEMORRHOID SURGERY    . PARTIAL GASTRECTOMY       reports that he has been smoking. He has a 50.00 pack-year smoking history. He uses smokeless tobacco. He reports previous alcohol use. He reports that he does not use drugs.  No Known Allergies  Family History  Problem Relation Age of Onset  . Stroke Mother   . COPD Father        ?  Marland Kitchen Heart attack Father   . CAD Father   . Diabetes Other        family members  . CAD Other        family members  . Colon cancer Neg Hx   . Neuropathy Neg Hx     Prior to Admission medications   Medication Sig Start Date End Date Taking? Authorizing Provider  ANORO ELLIPTA 62.5-25 MCG/INH AEPB Inhale 1 puff into the lungs daily. 09/28/19  Yes [provider]  aspirin EC 81 MG tablet Take 81 mg by mouth daily.   Yes [provider]  atorvastatin (LIPITOR) 10 MG tablet Take 10 mg by mouth at bedtime.    Yes [provider]  Cholecalciferol (VITAMIN D) 2000  units tablet Take 2,000 Units by mouth daily.   Yes [provider]  diazepam (VALIUM) 5 MG tablet Take 2.5-5 mg by mouth as needed for anxiety (EVERY 8-12 HOURS AS NEEDED FOR ANXIETY).  04/29/16  Yes [provider]  diclofenac sodium (VOLTAREN) 1 % GEL Apply 2 g 4 (four) times daily topically. Patient taking differently: Apply 2 g topically 4 (four) times daily as needed (pain).  01/08/17  Yes Providence Lanius A, PA-C  gabapentin (NEURONTIN) 300 MG capsule Take 300-600 mg by mouth 2 (two) times daily as needed (nerve pain). 300mg  in AM and 600mg  QHS   Yes [provider]  HYDROcodone-acetaminophen (NORCO/VICODIN) 5-325 MG tablet Take 1 tablet by mouth daily as needed for moderate pain.  11/19/17  Yes  [provider]  ibuprofen (ADVIL) 200 MG tablet Take 200-400 mg by mouth every 6 (six) hours as needed for fever or moderate pain.   Yes [provider]  Multiple Vitamin (MULTIVITAMIN WITH MINERALS) TABS tablet Take 1 tablet by mouth daily.   Yes [provider]  naproxen sodium (ALEVE) 220 MG tablet Take 220 mg by mouth 2 (two) times daily as needed (pain).   Yes [provider]  Omega-3 Fatty Acids (FISH OIL PO) Take 1,200 mg by mouth 2 (two) times daily.    Yes [provider]  pantoprazole (PROTONIX) 40 MG tablet Take 1 tablet by mouth daily. 08/14/17  Yes [provider]  senna-docusate (SENOKOT-S) 8.6-50 MG tablet Take 1 tablet by mouth at bedtime as needed for mild constipation. 10/04/16  Yes Mariel Aloe, MD    Physical Exam: Constitutional: Moderately built and nourished. Vitals:   10/14/19 2100 10/14/19 2228 10/14/19 2312 10/14/19 2337  BP: 122/87 134/74 130/72   Pulse: 62 69 70   Resp: (!) 21 (!) 21 20   Temp:   97.9 F (36.6 C)   TempSrc:   Oral   SpO2: 95% 96% 97% 97%  Weight:      Height:       Eyes: Anicteric no pallor. ENMT: No discharge from the ears eyes nose or mouth. Neck: No mass felt.  No neck rigidity. Respiratory: No rhonchi or crepitations. Cardiovascular: S1-S2 heard. Abdomen: Soft nontender bowel sounds present. Musculoskeletal: No edema. Skin: No rash. Neurologic: Alert awake oriented to time place and person.  Moves all extremities. Psychiatric: Appears normal.  Normal affect.   Labs on Admission: I have personally reviewed following labs and imaging studies  CBC: Recent Labs  Lab 10/14/19 1732 10/14/19 1739  WBC  --  10.9*  NEUTROABS  --  8.1*  HGB 10.9* 13.2  HCT 32.0* 40.4  MCV  --  108.6*  PLT  --  323   Basic Metabolic Panel: Recent Labs  Lab 10/14/19 1732 10/14/19 1739  NA 139 139  K 3.7 4.3  CL  --  109  CO2  --  22  GLUCOSE  --  110*  BUN  --  26*  CREATININE  --   0.93  CALCIUM  --  8.7*   GFR: Estimated Creatinine Clearance: 53.6 mL/min (by C-G formula based on SCr of 0.93 mg/dL). Liver Function Tests: Recent Labs  Lab 10/14/19 1739  AST 26  ALT 20  ALKPHOS 120  BILITOT 0.5  PROT 5.2*  ALBUMIN 1.9*   No results for input(s): LIPASE, AMYLASE in the last 168 hours. No results for input(s): AMMONIA in the last 168 hours. Coagulation Profile: No results for input(s): INR,  PROTIME in the last 168 hours. Cardiac Enzymes: No results for input(s): CKTOTAL, CKMB, CKMBINDEX, TROPONINI in the last 168 hours. BNP (last 3 results) No results for input(s): PROBNP in the last 8760 hours. HbA1C: No results for input(s): HGBA1C in the last 72 hours. CBG: No results for input(s): GLUCAP in the last 168 hours. Lipid Profile: No results for input(s): CHOL, HDL, LDLCALC, TRIG, CHOLHDL, LDLDIRECT in the last 72 hours. Thyroid Function Tests: No results for input(s): TSH, T4TOTAL, FREET4, T3FREE, THYROIDAB in the last 72 hours. Anemia Panel: No results for input(s): VITAMINB12, FOLATE, FERRITIN, TIBC, IRON, RETICCTPCT in the last 72 hours. Urine analysis:    Component Value Date/Time   COLORURINE YELLOW 01/07/2017 2317   APPEARANCEUR CLEAR 01/07/2017 2317   LABSPEC 1.008 01/07/2017 2317   PHURINE 7.0 01/07/2017 2317   GLUCOSEU NEGATIVE 01/07/2017 2317   HGBUR NEGATIVE 01/07/2017 2317   BILIRUBINUR NEGATIVE 01/07/2017 2317   KETONESUR NEGATIVE 01/07/2017 2317   PROTEINUR NEGATIVE 01/07/2017 2317   UROBILINOGEN 0.2 08/20/2011 1933   NITRITE NEGATIVE 01/07/2017 2317   LEUKOCYTESUR NEGATIVE 01/07/2017 2317   Sepsis Labs: @LABRCNTIP (procalcitonin:4,lacticidven:4) ) Recent Results (from the past 240 hour(s))  SARS Coronavirus 2 by RT PCR (hospital order, performed in [place] hospital lab) Nasopharyngeal Nasopharyngeal Swab     Status: None   Collection Time: 10/14/19  5:10 PM   Specimen: Nasopharyngeal Swab  Result Value Ref Range Status    SARS Coronavirus 2 NEGATIVE NEGATIVE Final    Comment: (NOTE) SARS-CoV-2 target nucleic acids are NOT DETECTED.  The SARS-CoV-2 RNA is generally detectable in upper and lower respiratory specimens during the acute phase of infection. The lowest concentration of SARS-CoV-2 viral copies this assay can detect is 250 copies / mL. A negative result does not preclude SARS-CoV-2 infection and should not be used as the sole basis for treatment or other patient management decisions.  A negative result may occur with improper specimen collection / handling, submission of specimen other than nasopharyngeal swab, presence of viral mutation(s) within the areas targeted by this assay, and inadequate number of viral copies (<250 copies / mL). A negative result must be combined with clinical observations, patient history, and epidemiological information.  Fact Sheet for Patients:   StrictlyIdeas.no  Fact Sheet for Healthcare Providers: BankingDealers.co.za  This test is not yet approved or  cleared by the [place] FDA and has been authorized for detection and/or diagnosis of SARS-CoV-2 by FDA under an Emergency Use Authorization (EUA).  This EUA will remain in effect (meaning this test can be used) for the duration of the COVID-19 declaration under Section 564(b)(1) of the Act, 21 U.S.C. section 360bbb-3(b)(1), unless the authorization is terminated or revoked sooner.  Performed at [facility], [place] [address] [place], [place] [address]      Radiological Exams on Admission: CT Angio Chest PE W and/or Wo Contrast  Result Date: 10/14/2019 CLINICAL DATA:  Pulmonary embolism, dyspnea, hypoxia EXAM: CT ANGIOGRAPHY CHEST WITH CONTRAST TECHNIQUE: Multidetector CT imaging of the chest was performed using the standard protocol during bolus administration of intravenous contrast. Multiplanar CT image reconstructions and MIPs were obtained to  evaluate the vascular anatomy. CONTRAST:  154mL OMNIPAQUE IOHEXOL 350 MG/ML SOLN COMPARISON:  None. FINDINGS: Cardiovascular: There is excellent opacification of the pulmonary arterial tree. The central pulmonary arteries are enlarged in keeping with changes of pulmonary arterial hypertension. There is, however, no intraluminal filling defect identified to suggest acute pulmonary embolism. There is mild global cardiomegaly with  particular enlargement of the right ventricle and right atrium in keeping with elevated right heart pressure. Moderate multi-vessel coronary artery calcification. No pericardial effusion. Mild scattered atherosclerotic calcification is seen within the thoracic aorta, which is of normal caliber. Mediastinum/Nodes: There is shotty right hilar and subcarinal lymphadenopathy which may be reactive in nature. No frankly pathologic thoracic adenopathy. Thyroid gland unremarkable. Esophagus unremarkable. Lungs/Pleura: There is severe centrilobular emphysema again identified. Spiculated mass within the right apex demonstrates interval increase in size measuring 18 x 20 mm at axial image # 27/8 there is dense consolidation of the right lower lobe with areas of cavitation and hypoenhancement in keeping with areas of necrosis no discrete drainable fluid collection identified. There is consolidation also noted within the lateral segment of the right middle lobe, though to a lesser degree. There is convex expansion of the right major pleura raising the question of Klebsiella pneumonia as an underlying etiology. Upper Abdomen: No acute abnormality. Musculoskeletal: No chest wall abnormality. No acute or significant osseous findings. Review of the MIP images confirms the above findings. IMPRESSION: 1. No evidence of acute pulmonary embolism. 2. Interval increase in size of spiculated mass within the right apex, now measuring 18 x 20 mm, suspicious for a primary bronchogenic malignancy. This may be amenable  to tissue sampling by navigational bronchoscopy. 3. Right lower lobe necrotizing pneumonia. No discrete drainable fluid collection. Convex expansion of the right major pleura raise the question of a Klebsiella as an underlying organism. Milder consolidation within the right middle lobe. 4. Severe centrilobular emphysema. 5. Enlarged central pulmonary arteries and right heart in keeping with changes of pulmonary arterial hypertension and elevated right heart pressure. 6. Emphysema and aortic atherosclerosis. Aortic Atherosclerosis (ICD10-I70.0) and Emphysema (ICD10-J43.9). Electronically Signed   By: Fidela Salisbury MD   On: 10/14/2019 21:53   DG Chest Port 1 View  Result Date: 10/14/2019 CLINICAL DATA:  Dyspnea, short of breath EXAM: PORTABLE CHEST 1 VIEW COMPARISON:  01/07/2017, CT 07/15/2019 FINDINGS: Hyperinflation with emphysematous disease. Consolidation in the right lower lobe, appears progressed since chest CT from May. Right paratracheal opacity presumably corresponds to the spiculated density on CT. Stable cardiomediastinal silhouette. No pneumothorax. Wirelike density at the right thoracic inlet for which clinical correlation is recommended. IMPRESSION: 1. Increased consolidation and airspace disease in the right lower lobe compared to CT from May 2021 2. Emphysematous disease. 3. Right medial apical ill-defined opacity, likely corresponds to the spiculated density on CT, potentially representing scar or neoplasm. Electronically Signed   By: Donavan Foil M.D.   On: 10/14/2019 18:02    EKG: Independently reviewed.  Normal sinus rhythm with nonspecific T wave changes.  Assessment/Plan Principal Problem:   Acute respiratory failure (HCC) Active Problems:   Tobacco abuse   Lung mass   CAP (community acquired pneumonia)   Acute respiratory failure with hypoxia ([place])    1. Acute respiratory failure with hypoxia secondary to necrotizing pneumonia for which patient has been started on empiric  antibiotics including Zosyn.  Check sputum cultures urine for Legionella strep antigen.  Discussed with pulmonary critical care will be seeing patient in consult. 2. Right-sided lung mass concerning for bronchogenic carcinoma which pulmonary critical care will be seeing patient likely bronchoscope. 3. History of hyperlipidemia on statins. 4. COPD not actively wheezing.  We will continue nebulizer. 5. Tobacco abuse advised about quitting. 6. Chronic pain on hydrocodone and gabapentin. 7. Severe protein calorie malnutrition will need nutrition input.  Since patient is acute respiratory failure with hypoxia  will need inpatient status.   DVT prophylaxis: Lovenox. Code Status: DNR. Family Communication: Discussed with patient.  Will need to discuss with patient's daughter to get further history. Disposition Plan: Home. Consults called: Pulmonary critical care. Admission status: Inpatient.   Rise Patience MD [facility] Pager [phone].  If 7PM-7AM, please contact night-coverage [email or web address] Password [email or web address]  10/14/2019, 11:52 PM

## 2019-10-14 NOTE — Progress Notes (Signed)
ABG results given to Valarie Merino, MD no new orders.

## 2019-10-14 NOTE — ED Notes (Signed)
Date and time results received: 10/14/19   Test: Lactic Acid Critical Value: 2.7  Name of Provider Notified: Francia Greaves MD

## 2019-10-14 NOTE — Progress Notes (Signed)
Pharmacy Antibiotic Note  Curtis Gentry is a 74 y.o. male admitted on 10/14/2019 with pneumonia.  Pharmacy has been consulted for vancomycin and cefepime dosing. Pt is afebrile and WBC is 10.9. SCr is WNL and lactic acid has normalized.   Plan: Vancomycin 1gm IV x 1 then 500mg  IV Q12H Cefepime 2gm IV Q12H F/u renal fxn, C&S, clinical status and trough at SS  Height: 5\' 8"  (172.7 cm) Weight: 54.4 kg (120 lb) IBW/kg (Calculated) : 68.4  Temp (24hrs), Avg:97.4 F (36.3 C), Min:97.4 F (36.3 C), Max:97.4 F (36.3 C)  Recent Labs  Lab 10/14/19 1739 10/14/19 1940  WBC 10.9*  --   CREATININE 0.93  --   LATICACIDVEN 2.7* 1.9    Estimated Creatinine Clearance: 53.6 mL/min (by C-G formula based on SCr of 0.93 mg/dL).    No Known Allergies  Antimicrobials this admission: Vanc 8/20>> Cefepime 8/20>>  Dose adjustments this admission: N/A  Microbiology results: Pending  Thank you for allowing pharmacy to be a part of this patient's care.  Curtis Gentry, Rande Lawman 10/14/2019 10:01 PM

## 2019-10-14 NOTE — ED Notes (Signed)
Pt transported to CT ?

## 2019-10-14 NOTE — ED Triage Notes (Signed)
Pt to ED via EMS from PCP c/o [facility], failure to thrive. Pt home o2 requirements 2L-, on EMS arrival pt o2 sats in the  80s, placed on nonrebreather- sat 90. Pt dx with left side lung mass in June. Lives at home with daughter, apparently over the past two weeks pt has had a decline , not eating well. Pt currently not voicing complaints. DNR.  #22 LFA. No meds given by EMS Last VS :120/ 72, HR 60S-AFIB-hx of,

## 2019-10-14 NOTE — ED Notes (Signed)
This RN gave report to RN assigned to pt designated room on [address]

## 2019-10-14 NOTE — ED Notes (Signed)
RN called CT to notify that pt has proper IV access for scan.

## 2019-10-15 DIAGNOSIS — R918 Other nonspecific abnormal finding of lung field: Secondary | ICD-10-CM

## 2019-10-15 DIAGNOSIS — Z720 Tobacco use: Secondary | ICD-10-CM

## 2019-10-15 DIAGNOSIS — J9601 Acute respiratory failure with hypoxia: Secondary | ICD-10-CM

## 2019-10-15 DIAGNOSIS — J189 Pneumonia, unspecified organism: Secondary | ICD-10-CM

## 2019-10-15 LAB — CBC
HCT: 33.3 % — ABNORMAL LOW (ref 39.0–52.0)
Hemoglobin: 11.3 g/dL — ABNORMAL LOW (ref 13.0–17.0)
MCH: 35.6 pg — ABNORMAL HIGH (ref 26.0–34.0)
MCHC: 33.9 g/dL (ref 30.0–36.0)
MCV: 105 fL — ABNORMAL HIGH (ref 80.0–100.0)
Platelets: 349 10*3/uL (ref 150–400)
RBC: 3.17 MIL/uL — ABNORMAL LOW (ref 4.22–5.81)
RDW: 14 % (ref 11.5–15.5)
WBC: 11 10*3/uL — ABNORMAL HIGH (ref 4.0–10.5)
nRBC: 0 % (ref 0.0–0.2)

## 2019-10-15 LAB — BASIC METABOLIC PANEL
Anion gap: 8 (ref 5–15)
BUN: 23 mg/dL (ref 8–23)
CO2: 22 mmol/L (ref 22–32)
Calcium: 8.1 mg/dL — ABNORMAL LOW (ref 8.9–10.3)
Chloride: 107 mmol/L (ref 98–111)
Creatinine, Ser: 0.81 mg/dL (ref 0.61–1.24)
GFR calc Af Amer: >60 mL/min (ref >60)
GFR calc non Af Amer: >60 mL/min (ref >60)
Glucose, Bld: 94 mg/dL (ref 70–99)
Potassium: 4.1 mmol/L (ref 3.5–5.1)
Sodium: 137 mmol/L (ref 135–145)

## 2019-10-15 MED ORDER — IPRATROPIUM-ALBUTEROL 0.5-2.5 (3) MG/3ML IN SOLN: 3.0000 mL | RESPIRATORY_TRACT | Status: DC | PRN

## 2019-10-15 MED ORDER — IPRATROPIUM-ALBUTEROL 0.5-2.5 (3) MG/3ML IN SOLN
3.0000 mL | RESPIRATORY_TRACT | Status: DC
  Administered 2019-10-15: 3 mL via RESPIRATORY_TRACT
  Filled 2019-10-15: qty 3

## 2019-10-15 MED ORDER — GABAPENTIN 600 MG PO TABS
600.0000 mg | ORAL_TABLET | Freq: Every day | ORAL | Status: DC
  Administered 2019-10-15 – 2019-10-16 (×2): 600 mg via ORAL
  Filled 2019-10-15 (×2): qty 1

## 2019-10-15 MED ORDER — PIPERACILLIN-TAZOBACTAM 3.375 G IVPB
3.3750 g | Freq: Three times a day (TID) | INTRAVENOUS | Status: DC
  Administered 2019-10-15 – 2019-10-16 (×4): 3.375 g via INTRAVENOUS
  Filled 2019-10-15 (×5): qty 50

## 2019-10-15 MED ORDER — IPRATROPIUM-ALBUTEROL 0.5-2.5 (3) MG/3ML IN SOLN
3.0000 mL | Freq: Four times a day (QID) | RESPIRATORY_TRACT | Status: DC
  Administered 2019-10-15 – 2019-10-16 (×4): 3 mL via RESPIRATORY_TRACT
  Filled 2019-10-15 (×4): qty 3

## 2019-10-15 MED ORDER — GABAPENTIN 300 MG PO CAPS
300.0000 mg | ORAL_CAPSULE | Freq: Every day | ORAL | Status: DC
  Administered 2019-10-15 – 2019-10-17 (×3): 300 mg via ORAL
  Filled 2019-10-15 (×3): qty 1

## 2019-10-15 MED ORDER — GUAIFENESIN 100 MG/5ML PO SOLN: 5.0000 mL | ORAL | Status: DC | PRN

## 2019-10-15 MED ORDER — ENSURE ENLIVE PO LIQD
237.0000 mL | Freq: Three times a day (TID) | ORAL | Status: DC
  Administered 2019-10-15 – 2019-10-17 (×4): 237 mL via ORAL

## 2019-10-15 MED ORDER — ALBUTEROL SULFATE (2.5 MG/3ML) 0.083% IN NEBU: 2.5000 mg | INHALATION_SOLUTION | RESPIRATORY_TRACT | Status: DC | PRN

## 2019-10-15 NOTE — Consult Note (Signed)
NAME:  Curtis Gentry, MRN:  [id number], DOB:  02-Aug-1945, LOS: 1 ADMISSION DATE:  10/14/2019, CONSULTATION DATE:  10/15/2019 REFERRING MD: Ree Kida , CHIEF COMPLAINT: Right sided spiculated mass concerning for bronchogenic carcinoma,  necrotizing pneumonia   Brief History   Curtis Gentry a 74 y.o.male, current every day smoker with a 50 pack year smoking history with history of  hyperlipidemia, recurrent syncope and possible COPD was brought to the ER on 8/20 after becoming  more short of breath. Patient endorsed  he had been short of breath for last few weeks and was treated for pneumonia by his primary care physician. Despite outpatient treatment he was not improving and was experiencing worsening shortness of breath.  EMS was called and was brought to the ER. Initially patient required a  nonrebreather. He Denied chest pain, had a  benign productive cough. Pt. Endorses a poor appetite and recent weight loss.   History of present illness   Curtis Gentry a 74 y.o.male, current every day smoker with a 50 pack year smoking history with history of  hyperlipidemia, recurrent syncope and possible COPD was brought to the ER on 8/20 after becoming  more short of breath. Patient endorsed  he had been short of breath for last few weeks and was treated for pneumonia by his primary care physician. Despite outpatient treatment he was not improving and was experiencing worsening shortness of breath.  EMS was called and was brought to the ER. Initially patient required a  nonrebreather. He Denied chest pain, had a  benign productive cough. Pt. Endorses a poor appetite and recent weight loss. He was admitted for treatment of his pneumonia.  CTA was done which was negative for a PE, but showed an interval increase in size of a spiculated mass ( right apex) concerning for primary  bronchogenic carcinoma. ( First noted in 06/2019 CT Chest). Additionally , Right lower lobe necrotizing pneumonia  and a  convex expansion of the right major pleura concerning for  Klebsiella as an underlying organism. Milder consolidation within the right middle lobe was also noted. Blood cultures were obtained , and patient was started on empiric antibiotics. Labs show microcytic picture.  Very low albumin levels.  EKG showed normal sinus rhythm with nonspecific T wave changes.  PCCM have been asked to consult for evaluation and treatment plan for right sided spiculated mass.   Past Medical History   Past Medical History:  Diagnosis Date  . Abnormality of gait 05/03/2015  . Anemia   . ANEMIA-UNSPECIFIED 09/06/2008   Qualifier: Diagnosis of  By: Henrene Pastor MD, Docia Chuck   . Anxiety   . Arthritis   . BPH (benign prostatic hyperplasia) 10/03/2016  . DYSPHAGIA 09/06/2008   Qualifier: Diagnosis of  By: Henrene Pastor MD, Docia Chuck   . Erectile dysfunction   . Hereditary and idiopathic peripheral neuropathy 05/03/2015  . Hyperlipidemia   . Hypokalemia 08/21/2011  . Peptic ulcer disease   . Perforated ulcer ([place])   . SDH (subdural hematoma) ([place]) 10/03/2016  . Slurred speech 10/03/2016  . 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 Syncope and collapse 08/20/2011  . Tobacco abuse 10/03/2016  . Unspecified disease of spinal cord    myelopahty, cervical spine    Significant Hospital Events   10/14/2019 Admission to [facility] for worsening dyspnea  Consults:  8/21: Pulmonary  Procedures:    Significant Diagnostic Tests:  10/14/2019: CTA Chest  No evidence of acute pulmonary embolism. Interval increase in size of spiculated  mass within the right apex, now measuring 18 x 20 mm, suspicious for a primary bronchogenic malignancy. This may be amenable to tissue sampling by navigational bronchoscopy. Right lower lobe necrotizing pneumonia. No discrete drainable fluid collection. Convex expansion of the right major pleura raise the question of a Klebsiella as an underlying organism. Milder consolidation within the right middle  lobe. Severe centrilobular emphysema. Enlarged central pulmonary arteries and right heart in keeping with changes of pulmonary arterial hypertension and elevated right heart pressure. Emphysema and aortic atherosclerosis. Micro Data:  8/20 Blood Cultures>>  8/20 Sars Coronavirus 2>> Negative  Antimicrobials:  Zosyn 10/15/2019  >> Vanc  10/15/2019 >>   Interim history/subjective:  Pt. States his breathing is better than upon admission. Currently on 3 L [place] WBC is 11, T max is 97.9 MAP is 68  Objective   Blood pressure (!) 91/56, pulse 78, temperature (!) 97.5 F (36.4 C), temperature source Oral, resp. rate 18, height 5\' 8"  (1.727 m), weight 46.4 kg, SpO2 90 %.       No intake or output data in the 24 hours ending 10/15/19 1133 Filed Weights   10/14/19 1710 10/14/19 2337 10/15/19 0700  Weight: 54.4 kg 47.7 kg 46.4 kg    Examination: General: Elderly thin and frail male , in NAD on 3 L [place] HENT: NCAT, MM pink and dry, No LAD, No JVD, Nasal canula Lungs: Bilateral chest excursion, Coarse BS, diminished per bases Cardiovascular: S1, S2, RRR, No MRG Abdomen: Soft, NT, ND, BS+, very thin Extremities: No obvious deformities, very thin, muscle wasting Neuro: Alert and oriented x 3, MAE x 4, Cranial nerves intact, appropriate, Very HOH GU: Not assessed  Resolved Hospital Problem list     Assessment & Plan:  Spiculated mass within the rightapex, now measuring 18 x 20 mm, suspicious for a primary bronchogenic malignancy. Current Smoker with a 50 pack year smoking history Significant weight loss over the last several months Plan Referral to outpatient hospice>> see discussion below  Acute Respiratory Failure with hypoxia 2/2 Right Sided necrotizing pneumonia Suspect aspiration pneumonia>> failed OP therapy Underlying COPD >> baseline oxygen requirement is 2.5 L Plan Wean oxygen as able for sats > 88% Continue antibiotics per primary team >> will need 2 weeks total of  therapy Aggressive Pulmonary Toilet with IS and flutter valve  OOB to chair Will need follow up as OP for resolution of pneumonia Will change Duo Nebs to scheduled Q 6 Will add Albuterol nebs as prn Will add prn robitussin  Discussion: Dr. Valeta Harms and I spoke with both patient and his daughter at length. We discussed that based on the appearance of the mass in his chest, in addition to the recent weight loss,  this is  most likely has cancer. We discussed the option of waiting until he has recovered from his current pneumonia to do a navigational bronchoscopy , which in his debilitated  condition would carry considerable risk. He and his daughter have been talking about "if" this was cancer, what would he do. The patient  had decided that he would not choose to have any therapy, but would prefer [place] home and live out his remaining time with family.  His daughter is in agreement with this plan, and states they have multiple family members who would assist with care at home. Plan is to treat pneumonia, and get the patient home with hospice so that he can spend his remaining time with his family. Dr. Valeta Harms has called and spoken with  Dr. Ree Kida.   Best practice:  Diet: Per Primary team Pain/Anxiety/Delirium protocol (if indicated): *Per Primary Team VAP protocol (if indicated): NA DVT prophylaxis: Heparin GI prophylaxis: Protonix Glucose control: Per Primary Team Mobility: OOB to chair Code Status:DNR Family Communication: Daughter and patient updated at bedside Disposition: 3E   Labs   CBC: Recent Labs  Lab 10/14/19 1732 10/14/19 1739 10/15/19 0244  WBC  --  10.9* 11.0*  NEUTROABS  --  8.1*  --   HGB 10.9* 13.2 11.3*  HCT 32.0* 40.4 33.3*  MCV  --  108.6* 105.0*  PLT  --  366 161    Basic Metabolic Panel: Recent Labs  Lab 10/14/19 1732 10/14/19 1739 10/15/19 0244  NA 139 139 137  K 3.7 4.3 4.1  CL  --  109 107  CO2  --  22 22  GLUCOSE  --  110* 94  BUN  --  26* 23   CREATININE  --  0.93 0.81  CALCIUM  --  8.7* 8.1*   GFR: Estimated Creatinine Clearance: 52.5 mL/min (by C-G formula based on SCr of 0.81 mg/dL). Recent Labs  Lab 10/14/19 1739 10/14/19 1940 10/15/19 0244  WBC 10.9*  --  11.0*  LATICACIDVEN 2.7* 1.9  --     Liver Function Tests: Recent Labs  Lab 10/14/19 1739  AST 26  ALT 20  ALKPHOS 120  BILITOT 0.5  PROT 5.2*  ALBUMIN 1.9*   No results for input(s): LIPASE, AMYLASE in the last 168 hours. No results for input(s): AMMONIA in the last 168 hours.  ABG    Component Value Date/Time   PHART 7.457 (H) 10/14/2019 1732   PCO2ART 33.5 10/14/2019 1732   PO2ART 49 (L) 10/14/2019 1732   HCO3 23.6 10/14/2019 1732   TCO2 25 10/14/2019 1732   O2SAT 87.0 10/14/2019 1732     Coagulation Profile: No results for input(s): INR, PROTIME in the last 168 hours.  Cardiac Enzymes: No results for input(s): CKTOTAL, CKMB, CKMBINDEX, TROPONINI in the last 168 hours.  HbA1C: Hgb A1c MFr Bld  Date/Time Value Ref Range Status  10/04/2016 03:54 AM 5.8 (H) 4.8 - 5.6 % Final    Comment:    (NOTE)         Pre-diabetes: 5.7 - 6.4         Diabetes: >6.4         Glycemic control for adults with diabetes: <7.0     CBG: No results for input(s): GLUCAP in the last 168 hours.  Review of Systems:   Gen: Denies fever, + chills, + weight change, + fatigue, No night sweats HEENT: Denies blurred vision, double vision, + hearing loss, No  tinnitus, sinus congestion, rhinorrhea, sore throat, neck stiffness, dysphagia PULM: + shortness of breath, + cough, + sputum production, No hemoptysis, + wheezing CV: Denies chest pain, edema, orthopnea, paroxysmal nocturnal dyspnea, palpitations GI: Denies abdominal pain, nausea, vomiting, diarrhea, hematochezia, melena, constipation, change in bowel habits, + loss of appetite GU: Denies dysuria, hematuria, polyuria, oliguria, urethral discharge Endocrine: Denies hot or cold intolerance, polyuria, polyphagia  or appetite change Derm: Denies rash, dry skin, scaling or peeling skin change Heme: Denies easy bruising, bleeding, bleeding gums Neuro: Denies headache, numbness, + weakness, slurred speech, loss of memory or consciousness  Past Medical History  He,  has a past medical history of Abnormality of gait (05/03/2015), Anemia, ANEMIA-UNSPECIFIED (09/06/2008), Anxiety, Arthritis, BPH (benign prostatic hyperplasia) (10/03/2016), DYSPHAGIA (09/06/2008), Erectile dysfunction, Hereditary and idiopathic peripheral neuropathy (05/03/2015), Hyperlipidemia,  Hypokalemia (08/21/2011), Peptic ulcer disease, Perforated ulcer ([place]), SDH (subdural hematoma) ([place]) (10/03/2016), Slurred speech (10/03/2016), Spondylosis, cervical, with myelopathy (05/03/2015), Syncope and collapse (08/20/2011), Tobacco abuse (10/03/2016), and Unspecified disease of spinal cord.   Surgical History    Past Surgical History:  Procedure Laterality Date  . BACK SURGERY  2011   3 level ACD with fusion and plating  . HEMORRHOID SURGERY    . PARTIAL GASTRECTOMY       Social History   reports that he has been smoking. He has a 50.00 pack-year smoking history. He uses smokeless tobacco. He reports previous alcohol use. He reports that he does not use drugs.   Family History   His family history includes CAD in his father and another family member; COPD in his father; Diabetes in an other family member; Heart attack in his father; Stroke in his mother. There is no history of Colon cancer or Neuropathy.   Allergies No Known Allergies   Home Medications  Prior to Admission medications   Medication Sig Start Date End Date Taking? Authorizing Provider  ANORO ELLIPTA 62.5-25 MCG/INH AEPB Inhale 1 puff into the lungs daily. 09/28/19  Yes [provider]  aspirin EC 81 MG tablet Take 81 mg by mouth daily.   Yes [provider]  atorvastatin (LIPITOR) 10 MG tablet Take 10 mg by mouth at bedtime.    Yes [provider]   Cholecalciferol (VITAMIN D) 2000 units tablet Take 2,000 Units by mouth daily.   Yes [provider]  diazepam (VALIUM) 5 MG tablet Take 2.5-5 mg by mouth as needed for anxiety (EVERY 8-12 HOURS AS NEEDED FOR ANXIETY).  04/29/16  Yes [provider]  diclofenac sodium (VOLTAREN) 1 % GEL Apply 2 g 4 (four) times daily topically. Patient taking differently: Apply 2 g topically 4 (four) times daily as needed (pain).  01/08/17  Yes Providence Lanius A, PA-C  gabapentin (NEURONTIN) 300 MG capsule Take 300-600 mg by mouth 2 (two) times daily as needed (nerve pain). 300mg  in AM and 600mg  QHS   Yes [provider]  HYDROcodone-acetaminophen (NORCO/VICODIN) 5-325 MG tablet Take 1 tablet by mouth daily as needed for moderate pain.  11/19/17  Yes [provider]  ibuprofen (ADVIL) 200 MG tablet Take 200-400 mg by mouth every 6 (six) hours as needed for fever or moderate pain.   Yes [provider]  Multiple Vitamin (MULTIVITAMIN WITH MINERALS) TABS tablet Take 1 tablet by mouth daily.   Yes [provider]  naproxen sodium (ALEVE) 220 MG tablet Take 220 mg by mouth 2 (two) times daily as needed (pain).   Yes [provider]  Omega-3 Fatty Acids (FISH OIL PO) Take 1,200 mg by mouth 2 (two) times daily.    Yes [provider]  pantoprazole (PROTONIX) 40 MG tablet Take 1 tablet by mouth daily. 08/14/17  Yes [provider]  senna-docusate (SENOKOT-S) 8.6-50 MG tablet Take 1 tablet by mouth at bedtime as needed for mild constipation. 10/04/16  Yes Mariel Aloe, MD     Critical care time: 35 minutes    Magdalen Spatz, MSN, AGACNP-BC [place] for personal pager PCCM on call pager [phone] 10/15/2019 1:05 PM   Pulmonary critical care attending:  This is a 74 year old gentleman current every day smoker, has been losing weight for the past several months.  Presents with shortness of  breath and a large right lower lobe necrotizing pneumonia.  Talking with  the patient's daughter the patient routinely coughs and has trouble eating and swallowing.  I suspect he is aspirating on a regular basis.  Of note we were consulted for evaluation of a spiculated right upper lobe lesion that is concerning for primary bronchogenic carcinoma.  We had a extensive discussion at bedside what the patient would do if we were to prove that he had a malignancy.  Patient states that he would not want to have therapy to include radiation and/or chemo if that was ever determined to be the need.  I agree with this and the fact that he has a BMI of 15 and is very cachectic to begin when he would not have a functional status that would support this.  And he is not a surgical candidate for resection.  BP (!) 88/57 (BP Location: Right Arm)   Pulse 76   Temp 97.8 F (36.6 C) (Oral)   Resp 18   Ht 5\' 8"  (1.727 m)   Wt 46.4 kg   SpO2 93%   BMI 15.55 kg/m   General: Elderly male cachectic significant weight loss temporalis muscle wasting HEENT: Temporalis masseter muscle wasting Extremities: Loss of supraclavicular fat pad forearm muscle wasting Heart: Distant heart tones Lungs: Distant breath sounds no wheeze  Labs: Reviewed CT scan chest: Reviewed, no significant right lower lobe necrotizing pneumonia with a apical spiculated mass concerning for primary bronchogenic carcinoma which is enlarged from previous CAT scan beginning of the year. The patient's images have been independently reviewed by me.    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  Patient would like to go home if at all possible. He would not want to be admitted to a  facility. He understands that he has end-stage lung disease as well as a potential malignancy of the lung. At this point I agree with decisions made for hospice care. I would complete 14 days of Augmentin at discharge. He also needs to be on regular scheduled nebs. He will need oxygen for comfort at discharge.  I am happy to serve as the patient's hospice care doc if needed.  Garner Nash, DO [facility] 10/15/2019 3:20 PM

## 2019-10-15 NOTE — Progress Notes (Signed)
Pt BP 88/57 , pt asymptomatic ,  MD notified Will continue to monitor

## 2019-10-15 NOTE — Progress Notes (Signed)
PROGRESS NOTE    EZEKIAH Gentry  EHU:[id number] DOB: 02-21-1946 DOA: 10/14/2019 PCP: Curtis Solian, MD   Brief Narrative:  HPI on 10/14/2019 by Dr. [name] Curtis Gentry is a 74 y.o. male with history of tobacco abuse, hyperlipidemia, recurrent syncope and possible COPD was brought to the ER after patient became more short of breath.  Patient states he has been short of breath for last few weeks and was treated for pneumonia by his primary care physician.  Despite which he was getting more short of breath EMS was called and was brought to the ER.  Initially patient was on nonrebreather.  Denies chest pain.  Has benign productive cough.  Has been having poor appetite and has lost weight recently.  Assessment & Plan   Acute on chronic hypoxic respiratory failure secondary to necrotizing pneumonia -Patient uses 2.5 L of oxygen chronically -Patient required 9 L nonrebreather mask while in the emergency department -CT angiogram of the chest shows increasing size of right-sided spiculated mass concerning for bronchogenic carcinoma as well as concern for necrotizing pneumonia -It seems the patient was recently treated by his PCP for pneumonia-currently on empiric antibiotics vancomycin and Zosyn -Pulmonology consulted and appreciated -Strep and Legionella urine antigens pending  Right-sided mass  -concerning for bronchogenic pneumonia as noted on the CT scan -Pulmonology consulted and appreciated -?  May need bronchoscopy  Hyperlipidemia -Continue statin  COPD -No active wheezing noted -Continue nebulizer treatments as needed  Tobacco abuse -Discussed cessation  Chronic pain -Continue hydrocodone and gabapentin  Severe protein calorie malnutrition -Nutrition consulted  Chronic anemia  -stable, continue to monitor CBC  DVT Prophylaxis  heparin  Code Status: DNR  Family Communication: None at bedside  Disposition Plan:  Status is: Inpatient  Remains inpatient  appropriate because:Inpatient level of care appropriate due to severity of illness   Dispo: The patient is from: Home              Anticipated d/c is to: Home              Anticipated d/c date is: 2 days              Patient currently is not medically stable to d/c.  Consultants Pulmonology  Procedures  None  Antibiotics   Anti-infectives (From admission, onward)   Start     Dose/Rate Route Frequency Ordered Stop   10/15/19 1100  vancomycin (VANCOREADY) IVPB 500 mg/100 mL        500 mg 100 mL/hr over 60 Minutes Intravenous Every 12 hours 10/14/19 2200     10/15/19 1000  piperacillin-tazobactam (ZOSYN) IVPB 3.375 g        3.375 g 12.5 mL/hr over 240 Minutes Intravenous Every 8 hours 10/15/19 0013     10/14/19 2200  vancomycin (VANCOCIN) IVPB 1000 mg/200 mL premix        1,000 mg 200 mL/hr over 60 Minutes Intravenous  Once 10/14/19 2200 10/14/19 2320   10/14/19 2200  ceFEPIme (MAXIPIME) 2 g in sodium chloride 0.9 % 100 mL IVPB  Status:  Discontinued        2 g 200 mL/hr over 30 Minutes Intravenous Every 12 hours 10/14/19 2200 10/14/19 2352      Subjective:   Curtis Gentry seen and examined today.  Continues to complain of shortness of breath and dry cough.  States he was recently treated for pneumonia and one long canal he is told he has pneumonia in the other lung.  He  denies chest pain, abdominal pain, nausea or vomiting, diarrhea or constipation, dizziness or headache.    Objective:   Vitals:   10/15/19 0440 10/15/19 0700 10/15/19 0830 10/15/19 0859  BP: (!) 104/51   (!) 91/56  Pulse: 65   78  Resp: 18   18  Temp: 97.8 F (36.6 C)   (!) 97.5 F (36.4 C)  TempSrc: Oral   Oral  SpO2: 91%  94% 90%  Weight:  46.4 kg    Height:       No intake or output data in the 24 hours ending 10/15/19 1025 Filed Weights   10/14/19 1710 10/14/19 2337 10/15/19 0700  Weight: 54.4 kg 47.7 kg 46.4 kg    Exam  General: Well developed, chronically ill-appearing, NAD  HEENT: NCAT,  mucous membranes moist.   Cardiovascular: S1 S2 auscultated, RRR.  Respiratory: Diminished breath sounds  Abdomen: Soft, nontender, nondistended, + bowel sounds  Extremities: warm dry without cyanosis clubbing or edema  Neuro: AAOx3, nonfocal  Psych: Appropriate mood and affect   Data Reviewed: I have personally reviewed following labs and imaging studies  CBC: Recent Labs  Lab 10/14/19 1732 10/14/19 1739 10/15/19 0244  WBC  --  10.9* 11.0*  NEUTROABS  --  8.1*  --   HGB 10.9* 13.2 11.3*  HCT 32.0* 40.4 33.3*  MCV  --  108.6* 105.0*  PLT  --  366 237   Basic Metabolic Panel: Recent Labs  Lab 10/14/19 1732 10/14/19 1739 10/15/19 0244  NA 139 139 137  K 3.7 4.3 4.1  CL  --  109 107  CO2  --  22 22  GLUCOSE  --  110* 94  BUN  --  26* 23  CREATININE  --  0.93 0.81  CALCIUM  --  8.7* 8.1*   GFR: Estimated Creatinine Clearance: 52.5 mL/min (by C-G formula based on SCr of 0.81 mg/dL). Liver Function Tests: Recent Labs  Lab 10/14/19 1739  AST 26  ALT 20  ALKPHOS 120  BILITOT 0.5  PROT 5.2*  ALBUMIN 1.9*   No results for input(s): LIPASE, AMYLASE in the last 168 hours. No results for input(s): AMMONIA in the last 168 hours. Coagulation Profile: No results for input(s): INR, PROTIME in the last 168 hours. Cardiac Enzymes: No results for input(s): CKTOTAL, CKMB, CKMBINDEX, TROPONINI in the last 168 hours. BNP (last 3 results) No results for input(s): PROBNP in the last 8760 hours. HbA1C: No results for input(s): HGBA1C in the last 72 hours. CBG: No results for input(s): GLUCAP in the last 168 hours. Lipid Profile: No results for input(s): CHOL, HDL, LDLCALC, TRIG, CHOLHDL, LDLDIRECT in the last 72 hours. Thyroid Function Tests: No results for input(s): TSH, T4TOTAL, FREET4, T3FREE, THYROIDAB in the last 72 hours. Anemia Panel: No results for input(s): VITAMINB12, FOLATE, FERRITIN, TIBC, IRON, RETICCTPCT in the last 72 hours. Urine analysis:      Component Value Date/Time   COLORURINE YELLOW 01/07/2017 2317   APPEARANCEUR CLEAR 01/07/2017 2317   LABSPEC 1.008 01/07/2017 2317   PHURINE 7.0 01/07/2017 2317   GLUCOSEU NEGATIVE 01/07/2017 2317   HGBUR NEGATIVE 01/07/2017 2317   BILIRUBINUR NEGATIVE 01/07/2017 2317   KETONESUR NEGATIVE 01/07/2017 2317   PROTEINUR NEGATIVE 01/07/2017 2317   UROBILINOGEN 0.2 08/20/2011 1933   NITRITE NEGATIVE 01/07/2017 2317   LEUKOCYTESUR NEGATIVE 01/07/2017 2317   Sepsis Labs: @LABRCNTIP (procalcitonin:4,lacticidven:4)  ) Recent Results (from the past 240 hour(s))  SARS Coronavirus 2 by RT PCR (hospital order, performed in [facility] hospital lab) Nasopharyngeal Nasopharyngeal Swab     Status: None   Collection Time: 10/14/19  5:10 PM   Specimen: Nasopharyngeal Swab  Result Value Ref Range Status   SARS Coronavirus 2 NEGATIVE NEGATIVE Final    Comment: (NOTE) SARS-CoV-2 target nucleic acids are NOT DETECTED.  The SARS-CoV-2 RNA is generally detectable in upper and lower respiratory specimens during the acute phase of infection. The lowest concentration of SARS-CoV-2 viral copies this assay can detect is 250 copies / mL. A negative result does not preclude SARS-CoV-2 infection and should not be used as the sole basis for treatment or other patient management decisions.  A negative result may occur with improper specimen collection / handling, submission of specimen other than nasopharyngeal swab, presence of viral mutation(s) within the areas targeted by this assay, and inadequate number of viral copies (<250 copies / mL). A negative result must be combined with clinical observations, patient history, and epidemiological information.  Fact Sheet for Patients:   StrictlyIdeas.no  Fact Sheet for Healthcare Providers: BankingDealers.co.za  This test is not yet approved or  cleared by the [place] FDA and has been authorized for  detection and/or diagnosis of SARS-CoV-2 by FDA under an Emergency Use Authorization (EUA).  This EUA will remain in effect (meaning this test can be used) for the duration of the COVID-19 declaration under Section 564(b)(1) of the Act, 21 U.S.C. section 360bbb-3(b)(1), unless the authorization is terminated or revoked sooner.  Performed at [facility], [place] [address], [place],  [address]       Radiology Studies: CT Angio Chest PE W and/or Wo Contrast  Result Date: 10/14/2019 CLINICAL DATA:  Pulmonary embolism, dyspnea, hypoxia EXAM: CT ANGIOGRAPHY CHEST WITH CONTRAST TECHNIQUE: Multidetector CT imaging of the chest was performed using the standard protocol during bolus administration of intravenous contrast. Multiplanar CT image reconstructions and MIPs were obtained to evaluate the vascular anatomy. CONTRAST:  163mL OMNIPAQUE IOHEXOL 350 MG/ML SOLN COMPARISON:  None. FINDINGS: Cardiovascular: There is excellent opacification of the pulmonary arterial tree. The central pulmonary arteries are enlarged in keeping with changes of pulmonary arterial hypertension. There is, however, no intraluminal filling defect identified to suggest acute pulmonary embolism. There is mild global cardiomegaly with particular enlargement of the right ventricle and right atrium in keeping with elevated right heart pressure. Moderate multi-vessel coronary artery calcification. No pericardial effusion. Mild scattered atherosclerotic calcification is seen within the thoracic aorta, which is of normal caliber. Mediastinum/Nodes: There is shotty right hilar and subcarinal lymphadenopathy which may be reactive in nature. No frankly pathologic thoracic adenopathy. Thyroid gland unremarkable. Esophagus unremarkable. Lungs/Pleura: There is severe centrilobular emphysema again identified. Spiculated mass within the right apex demonstrates interval increase in size measuring 18 x 20 mm at axial image # 27/8 there is  dense consolidation of the right lower lobe with areas of cavitation and hypoenhancement in keeping with areas of necrosis no discrete drainable fluid collection identified. There is consolidation also noted within the lateral segment of the right middle lobe, though to a lesser degree. There is convex expansion of the right major pleura raising the question of Klebsiella pneumonia as an underlying etiology. Upper Abdomen: No acute abnormality. Musculoskeletal: No chest wall abnormality. No acute or significant osseous findings. Review of the MIP images confirms the above findings. IMPRESSION: 1. No evidence of acute pulmonary embolism. 2. Interval increase in size of spiculated mass within the right apex, now measuring 18 x 20 mm, suspicious for a primary bronchogenic  malignancy. This may be amenable to tissue sampling by navigational bronchoscopy. 3. Right lower lobe necrotizing pneumonia. No discrete drainable fluid collection. Convex expansion of the right major pleura raise the question of a Klebsiella as an underlying organism. Milder consolidation within the right middle lobe. 4. Severe centrilobular emphysema. 5. Enlarged central pulmonary arteries and right heart in keeping with changes of pulmonary arterial hypertension and elevated right heart pressure. 6. Emphysema and aortic atherosclerosis. Aortic Atherosclerosis (ICD10-I70.0) and Emphysema (ICD10-J43.9). Electronically Signed   By: Fidela Salisbury MD   On: 10/14/2019 21:53   DG Chest Port 1 View  Result Date: 10/14/2019 CLINICAL DATA:  Dyspnea, short of breath EXAM: PORTABLE CHEST 1 VIEW COMPARISON:  01/07/2017, CT 07/15/2019 FINDINGS: Hyperinflation with emphysematous disease. Consolidation in the right lower lobe, appears progressed since chest CT from May. Right paratracheal opacity presumably corresponds to the spiculated density on CT. Stable cardiomediastinal silhouette. No pneumothorax. Wirelike density at the right thoracic inlet for which  clinical correlation is recommended. IMPRESSION: 1. Increased consolidation and airspace disease in the right lower lobe compared to CT from May 2021 2. Emphysematous disease. 3. Right medial apical ill-defined opacity, likely corresponds to the spiculated density on CT, potentially representing scar or neoplasm. Electronically Signed   By: Donavan Foil M.D.   On: 10/14/2019 18:02     Scheduled Meds: . aspirin EC  81 mg Oral Daily  . atorvastatin  10 mg Oral QHS  . cholecalciferol  2,000 Units Oral Daily  . gabapentin  300 mg Oral Daily  . gabapentin  600 mg Oral QHS  . heparin  5,000 Units Subcutaneous Q8H  . omega-3 acid ethyl esters  1 g Oral BID  . pantoprazole  40 mg Oral Daily   Continuous Infusions: . piperacillin-tazobactam (ZOSYN)  IV 3.375 g (10/15/19 0909)  . vancomycin       LOS: 1 day    Time Spent in minutes   45 minutes  Curtis Gentry D.O. on 10/15/2019 at 10:25 AM  Between 7am to 7pm - Please see pager noted on [email or web address]  After 7pm go to [email or web address]  And look for the night coverage person covering for me after hours  [facility] Office  [phone]

## 2019-10-15 NOTE — Progress Notes (Signed)
Initial Nutrition Assessment  DOCUMENTATION CODES:   Underweight  INTERVENTION:  Plan is to liberalize diet to regular.  Provide Ensure Enlive po TID, each supplement provides 350 kcal and 20 grams of protein. Patient prefers chocolate.  Provide Magic cup BID with lunch and dinner, each supplement provides 290 kcal and 9 grams of protein. Patient prefers chocolate.  Monitor magnesium, potassium, and phosphorus daily for at least 3 days, MD to replete as needed, as pt is at risk for refeeding syndrome.  NUTRITION DIAGNOSIS:   Inadequate oral intake related to poor appetite as evidenced by per patient/family report.  GOAL:   Patient will meet greater than or equal to 90% of their needs  MONITOR:   PO intake, Supplement acceptance, Labs, Weight trends, I & O's  REASON FOR ASSESSMENT:   Consult Assessment of nutrition requirement/status  ASSESSMENT:   74 year old male with PMHx of HLD, anxiety, PUD, arthritis, anemia, BPH, hx SDH, COPD admitted with necrotizing PNA, right apex mass.   8/20 CT Angio Chest: Interval increase in size of spiculated mass within the right apex, now measuring 18 x 20 mm, suspicious for a primary bronchogenic malignancy  Attempted to call patient on his room phone several times but he was unable to answer. Attempted to call his daughter Haywood Lasso with number listed in chart but number was unable to be dialed (?out of service). Able to reach patient's daughter Bary Leriche via cell phone number listed in chart [phone]). She reports patient has had a decreased appetite for a while now. He cannot eat 3 larger meals per day and has to eat small meals throughout the day. Lately he has been eating even less at meals (over the last 2 weeks). Lately eating more fruit such as grapes or bananas. They were going to try oral nutrition supplements at home but had not purchased them yet. Daughter reports patient would enjoy chocolate flavor oral nutrition  supplements. He also enjoys ice cream. She reports he does not have any difficulty with chewing or swallowing. No meal documentation available in chart at this time.  Daughter reports patient has lost a noticeable amount of weight. She reports at his heaviest his UBW was only 140-160 lbs. According to chart patient is currently 46.4 kg (102.29 lbs). Last wt prior to that was 53.6 kg on 12/09/2017.  Medications reviewed and include: vitamin D 2000 units daily, gabapentin, Lovaza 1 gram BID, Protonix, Zosyn, vancomycin.  Labs reviewed.  RD suspects patient is malnourished but unable to confirm at this time.  Discussed with MD via secure chat. Plan is to liberalize diet to regular. Per MD plan is to discharge home with hospice likely in next several days.  NUTRITION - FOCUSED PHYSICAL EXAM:  Unable to complete at this time as RD is working remotely.  Diet Order:   Diet Order            Diet Heart Room service appropriate? Yes; Fluid consistency: Thin  Diet effective now                EDUCATION NEEDS:   No education needs have been identified at this time  Skin:  Skin Assessment: Reviewed RN Assessment  Last BM:  10/14/2019  Height:   Ht Readings from Last 1 Encounters:  10/14/19 5\' 8"  (1.727 m)   Weight:   Wt Readings from Last 1 Encounters:  10/15/19 46.4 kg   Ideal Body Weight:  70 kg  BMI:  Body mass index is 15.55  kg/m.  Estimated Nutritional Needs:   Kcal:  1500-1700  Protein:  75-85 grams  Fluid:  1.2-1.5 L/day  Jacklynn Barnacle, MS, RD, LDN Pager number available on Amion

## 2019-10-15 NOTE — Progress Notes (Signed)
Pharmacy Antibiotic Note  Curtis Gentry is a 74 y.o. male admitted on 10/14/2019 with pneumonia.  Pharmacy has been consulted for vancomycin and cefepime dosing. Pt is afebrile and WBC is 10.9. SCr is WNL and lactic acid has normalized.   Plan: Vancomycin 1gm IV x 1 then 500mg  IV Q12H Cefepime 2gm IV Q12H F/u renal fxn, C&S, clinical status and trough at SS  Addendum: Consulted to change cefepime to Zosyn. Last cefepime 8/20 at 22:19.  Begin Zosyn 3.375 g IV q8h to be infused over 4 hours at 10:00   Height: 5\' 8"  (172.7 cm) Weight: 54.4 kg (120 lb) IBW/kg (Calculated) : 68.4  Temp (24hrs), Avg:97.7 F (36.5 C), Min:97.4 F (36.3 C), Max:97.9 F (36.6 C)  Recent Labs  Lab 10/14/19 1739 10/14/19 1940  WBC 10.9*  --   CREATININE 0.93  --   LATICACIDVEN 2.7* 1.9    Estimated Creatinine Clearance: 53.6 mL/min (by C-G formula based on SCr of 0.93 mg/dL).    No Known Allergies  Antimicrobials this admission: Vanc 8/20>> Cefepime 8/20>>8/21 Zosyn 8/21>>  Dose adjustments this admission: N/A  Microbiology results: Pending  Thank you for involving pharmacy in this patient's care.  Renold Genta, PharmD, BCPS Clinical Pharmacist 10/15/2019 12:12 AM  **Pharmacist phone directory can be found on [email or web address] listed under [place]**

## 2019-10-16 LAB — URINALYSIS, ROUTINE W REFLEX MICROSCOPIC
Bilirubin Urine: NEGATIVE
Glucose, UA: NEGATIVE mg/dL
Hgb urine dipstick: NEGATIVE
Ketones, ur: NEGATIVE mg/dL
Leukocytes,Ua: NEGATIVE
Nitrite: NEGATIVE
Protein, ur: NEGATIVE mg/dL
Specific Gravity, Urine: 1.025 (ref 1.005–1.030)
pH: 5 (ref 5.0–8.0)

## 2019-10-16 LAB — STREP PNEUMONIAE URINARY ANTIGEN: Strep Pneumo Urinary Antigen: NEGATIVE

## 2019-10-16 MED ORDER — SODIUM CHLORIDE 0.9 % IV SOLN
1.5000 g | Freq: Four times a day (QID) | INTRAVENOUS | Status: DC
  Administered 2019-10-16 – 2019-10-17 (×4): 1.5 g via INTRAVENOUS
  Filled 2019-10-16 (×7): qty 4

## 2019-10-16 MED ORDER — IPRATROPIUM-ALBUTEROL 0.5-2.5 (3) MG/3ML IN SOLN
3.0000 mL | Freq: Three times a day (TID) | RESPIRATORY_TRACT | Status: DC
  Administered 2019-10-16 – 2019-10-17 (×3): 3 mL via RESPIRATORY_TRACT
  Filled 2019-10-16 (×2): qty 3

## 2019-10-16 NOTE — Progress Notes (Signed)
PROGRESS NOTE    [name]  FKC:[id number] DOB: 1945-05-22 DOA: 10/14/2019 PCP: [name], MD   Brief Narrative:  HPI on 10/14/2019 by Dr. [name] Curtis Gentry is a 74 y.o. male with history of tobacco abuse, hyperlipidemia, recurrent syncope and possible COPD was brought to the ER after patient became more short of breath.  Patient states he has been short of breath for last few weeks and was treated for pneumonia by his primary care physician.  Despite which he was getting more short of breath EMS was called and was brought to the ER.  Initially patient was on nonrebreather.  Denies chest pain.  Has benign productive cough.  Has been having poor appetite and has lost weight recently.  Interim history Patient admitted with acute on chronic hypoxic respiratory failure likely secondary to necrotizing pneumonia and right-sided lung mass.  Pulmonary consulted and patient refused and declined further work-up.  Plan is for patient to be discharged to either residential hospice or home with hospice.  Consults are currently pending.  Patient now with likely new baseline of oxygen, 4 L.  Assessment & Plan   Acute on chronic hypoxic respiratory failure secondary to necrotizing pneumonia -Patient uses 2.5 L of oxygen chronically -Patient required 9 L nonrebreather mask while in the emergency department -CT angiogram of the chest shows increasing size of right-sided spiculated mass concerning for bronchogenic carcinoma as well as concern for necrotizing pneumonia -It seems the patient was recently treated by his PCP for pneumonia-currently on empiric antibiotics vancomycin and Zosyn- will transition to [facility]  -Pulmonology consulted and appreciated-discussed with Dr. Valeta Harms on 10/15/2019, likely due to some aspiration, recommended discharging patient with 2 weeks of Augmentin -Strep pneumonia antigen negative -Legionella urine antigen pending -Pulmonology consulted -Unable to wean back  to baseline oxygen amount.  Suspect new baseline of 4 L  Right-sided mass  -concerning for bronchogenic pneumonia as noted on the CT scan -Pulmonology consulted and appreciated -Discussed with pulmonology, Dr. Valeta Harms, on 10/15/2019, patient declined work-up for mass.  Plan is for home with hospice versus residential hospice.  Consult has been placed and pending.  He has also agreed to be patient's attending physician with hospice.  Hyperlipidemia -Continue statin  COPD -No active wheezing noted -Continue nebulizer treatments as needed  Tobacco abuse -Discussed cessation  Chronic pain -Continue hydrocodone and gabapentin  Severe protein calorie malnutrition -Nutrition consulted-continue supplements as well as liberalize diet to regular  Chronic anemia  -stable, continue to monitor CBC  DVT Prophylaxis  heparin  Code Status: DNR  Family Communication: None at bedside  Disposition Plan:  Status is: Inpatient  Remains inpatient appropriate because:Inpatient level of care appropriate due to severity of illness   Dispo: The patient is from: Home              Anticipated d/c is to: Home with hospice vs residential hospice, pending consult              Anticipated d/c date is: 2 days              Patient currently is not medically stable to d/c.  Consultants Pulmonology  Procedures  None  Antibiotics   Anti-infectives (From admission, onward)   Start     Dose/Rate Route Frequency Ordered Stop   10/15/19 1100  vancomycin (VANCOREADY) IVPB 500 mg/100 mL        500 mg 100 mL/hr over 60 Minutes Intravenous Every 12 hours 10/14/19 2200  10/15/19 1000  piperacillin-tazobactam (ZOSYN) IVPB 3.375 g        3.375 g 12.5 mL/hr over 240 Minutes Intravenous Every 8 hours 10/15/19 0013     10/14/19 2200  vancomycin (VANCOCIN) IVPB 1000 mg/200 mL premix        1,000 mg 200 mL/hr over 60 Minutes Intravenous  Once 10/14/19 2200 10/14/19 2320   10/14/19 2200  ceFEPIme (MAXIPIME) 2  g in sodium chloride 0.9 % 100 mL IVPB  Status:  Discontinued        2 g 200 mL/hr over 30 Minutes Intravenous Every 12 hours 10/14/19 2200 10/14/19 2352      Subjective:   Curtis Gentry seen and examined today.  Continues to have some shortness of breath and dry cough.  Denies current current chest pain, abdominal pain, nausea or vomiting, diarrhea or constipation, dizziness or headache.  Waiting for his daughter to come today.    Objective:   Vitals:   10/15/19 2349 10/16/19 0132 10/16/19 0736 10/16/19 0856  BP: (!) 94/52   (!) 94/57  Pulse: 82   81  Resp:    18  Temp: 98.2 F (36.8 C)   97.7 F (36.5 C)  TempSrc: Oral   Oral  SpO2: 93% 91% 93% 91%  Weight:      Height:        Intake/Output Summary (Last 24 hours) at 10/16/2019 0945 Last data filed at 10/16/2019 0400 Gross per 24 hour  Intake 560 ml  Output --  Net 560 ml   Filed Weights   10/14/19 1710 10/14/19 2337 10/15/19 0700  Weight: 54.4 kg 47.7 kg 46.4 kg   Exam  General: Well developed, chronically ill-appearing, NAD  HEENT: NCAT, mucous membranes moist.   Cardiovascular: S1 S2 auscultated, RRR  Respiratory: Diminished breath sounds  Abdomen: Soft, nontender, nondistended, + bowel sounds  Extremities: warm dry without cyanosis clubbing or edema  Neuro: AAOx3, nonfocal  Psych: pleasant, appropriate mood and affect  Data Reviewed: I have personally reviewed following labs and imaging studies  CBC: Recent Labs  Lab 10/14/19 1732 10/14/19 1739 10/15/19 0244  WBC  --  10.9* 11.0*  NEUTROABS  --  8.1*  --   HGB 10.9* 13.2 11.3*  HCT 32.0* 40.4 33.3*  MCV  --  108.6* 105.0*  PLT  --  366 793   Basic Metabolic Panel: Recent Labs  Lab 10/14/19 1732 10/14/19 1739 10/15/19 0244  NA 139 139 137  K 3.7 4.3 4.1  CL  --  109 107  CO2  --  22 22  GLUCOSE  --  110* 94  BUN  --  26* 23  CREATININE  --  0.93 0.81  CALCIUM  --  8.7* 8.1*   GFR: Estimated Creatinine Clearance: 52.5 mL/min (by  C-G formula based on SCr of 0.81 mg/dL). Liver Function Tests: Recent Labs  Lab 10/14/19 1739  AST 26  ALT 20  ALKPHOS 120  BILITOT 0.5  PROT 5.2*  ALBUMIN 1.9*   No results for input(s): LIPASE, AMYLASE in the last 168 hours. No results for input(s): AMMONIA in the last 168 hours. Coagulation Profile: No results for input(s): INR, PROTIME in the last 168 hours. Cardiac Enzymes: No results for input(s): CKTOTAL, CKMB, CKMBINDEX, TROPONINI in the last 168 hours. BNP (last 3 results) No results for input(s): PROBNP in the last 8760 hours. HbA1C: No results for input(s): HGBA1C in the last 72 hours. CBG: No results for input(s): GLUCAP in the last 168  hours. Lipid Profile: No results for input(s): CHOL, HDL, LDLCALC, TRIG, CHOLHDL, LDLDIRECT in the last 72 hours. Thyroid Function Tests: No results for input(s): TSH, T4TOTAL, FREET4, T3FREE, THYROIDAB in the last 72 hours. Anemia Panel: No results for input(s): VITAMINB12, FOLATE, FERRITIN, TIBC, IRON, RETICCTPCT in the last 72 hours. Urine analysis:    Component Value Date/Time   COLORURINE YELLOW 10/16/2019 0033   APPEARANCEUR CLEAR 10/16/2019 0033   LABSPEC 1.025 10/16/2019 0033   PHURINE 5.0 10/16/2019 0033   GLUCOSEU NEGATIVE 10/16/2019 0033   HGBUR NEGATIVE 10/16/2019 0033   BILIRUBINUR NEGATIVE 10/16/2019 0033   KETONESUR NEGATIVE 10/16/2019 0033   PROTEINUR NEGATIVE 10/16/2019 0033   UROBILINOGEN 0.2 08/20/2011 1933   NITRITE NEGATIVE 10/16/2019 0033   LEUKOCYTESUR NEGATIVE 10/16/2019 0033   Sepsis Labs: @LABRCNTIP (procalcitonin:4,lacticidven:4)  ) Recent Results (from the past 240 hour(s))  SARS Coronavirus 2 by RT PCR (hospital order, performed in [facility] hospital lab) Nasopharyngeal Nasopharyngeal Swab     Status: None   Collection Time: 10/14/19  5:10 PM   Specimen: Nasopharyngeal Swab  Result Value Ref Range Status   SARS Coronavirus 2 NEGATIVE NEGATIVE Final    Comment: (NOTE) SARS-CoV-2 target  nucleic acids are NOT DETECTED.  The SARS-CoV-2 RNA is generally detectable in upper and lower respiratory specimens during the acute phase of infection. The lowest concentration of SARS-CoV-2 viral copies this assay can detect is 250 copies / mL. A negative result does not preclude SARS-CoV-2 infection and should not be used as the sole basis for treatment or other patient management decisions.  A negative result may occur with improper specimen collection / handling, submission of specimen other than nasopharyngeal swab, presence of viral mutation(s) within the areas targeted by this assay, and inadequate number of viral copies (<250 copies / mL). A negative result must be combined with clinical observations, patient history, and epidemiological information.  Fact Sheet for Patients:   StrictlyIdeas.no  Fact Sheet for Healthcare Providers: BankingDealers.co.za  This test is not yet approved or  cleared by the [place] FDA and has been authorized for detection and/or diagnosis of SARS-CoV-2 by FDA under an Emergency Use Authorization (EUA).  This EUA will remain in effect (meaning this test can be used) for the duration of the COVID-19 declaration under Section 564(b)(1) of the Act, 21 U.S.C. section 360bbb-3(b)(1), unless the authorization is terminated or revoked sooner.  Performed at [facility], [place] [address], [place], [place] [address]   Culture, blood (routine x 2)     Status: None (Preliminary result)   Collection Time: 10/14/19  5:39 PM   Specimen: BLOOD LEFT FOREARM  Result Value Ref Range Status   Specimen Description BLOOD LEFT FOREARM  Final   Special Requests   Final    BOTTLES DRAWN AEROBIC AND ANAEROBIC Blood Culture results may not be optimal due to an inadequate volume of blood received in culture bottles   Culture   Final    NO GROWTH < 24 HOURS Performed at [facility], [place] [address],  [place], [place] [address]    Report Status PENDING  Incomplete  Culture, blood (routine x 2)     Status: None (Preliminary result)   Collection Time: 10/14/19  7:40 PM   Specimen: BLOOD  Result Value Ref Range Status   Specimen Description BLOOD SITE NOT SPECIFIED  Final   Special Requests   Final    BOTTLES DRAWN AEROBIC AND ANAEROBIC Blood Culture adequate volume   Culture  Final    NO GROWTH < 24 HOURS Performed at [facility], [place] [address] [place], [place] [address]    Report Status PENDING  Incomplete      Radiology Studies: CT Angio Chest PE W and/or Wo Contrast  Result Date: 10/14/2019 CLINICAL DATA:  Pulmonary embolism, dyspnea, hypoxia EXAM: CT ANGIOGRAPHY CHEST WITH CONTRAST TECHNIQUE: Multidetector CT imaging of the chest was performed using the standard protocol during bolus administration of intravenous contrast. Multiplanar CT image reconstructions and MIPs were obtained to evaluate the vascular anatomy. CONTRAST:  16mL OMNIPAQUE IOHEXOL 350 MG/ML SOLN COMPARISON:  None. FINDINGS: Cardiovascular: There is excellent opacification of the pulmonary arterial tree. The central pulmonary arteries are enlarged in keeping with changes of pulmonary arterial hypertension. There is, however, no intraluminal filling defect identified to suggest acute pulmonary embolism. There is mild global cardiomegaly with particular enlargement of the right ventricle and right atrium in keeping with elevated right heart pressure. Moderate multi-vessel coronary artery calcification. No pericardial effusion. Mild scattered atherosclerotic calcification is seen within the thoracic aorta, which is of normal caliber. Mediastinum/Nodes: There is shotty right hilar and subcarinal lymphadenopathy which may be reactive in nature. No frankly pathologic thoracic adenopathy. Thyroid gland unremarkable. Esophagus unremarkable. Lungs/Pleura: There is severe centrilobular emphysema again identified. Spiculated mass  within the right apex demonstrates interval increase in size measuring 18 x 20 mm at axial image # 27/8 there is dense consolidation of the right lower lobe with areas of cavitation and hypoenhancement in keeping with areas of necrosis no discrete drainable fluid collection identified. There is consolidation also noted within the lateral segment of the right middle lobe, though to a lesser degree. There is convex expansion of the right major pleura raising the question of Klebsiella pneumonia as an underlying etiology. Upper Abdomen: No acute abnormality. Musculoskeletal: No chest wall abnormality. No acute or significant osseous findings. Review of the MIP images confirms the above findings. IMPRESSION: 1. No evidence of acute pulmonary embolism. 2. Interval increase in size of spiculated mass within the right apex, now measuring 18 x 20 mm, suspicious for a primary bronchogenic malignancy. This may be amenable to tissue sampling by navigational bronchoscopy. 3. Right lower lobe necrotizing pneumonia. No discrete drainable fluid collection. Convex expansion of the right major pleura raise the question of a Klebsiella as an underlying organism. Milder consolidation within the right middle lobe. 4. Severe centrilobular emphysema. 5. Enlarged central pulmonary arteries and right heart in keeping with changes of pulmonary arterial hypertension and elevated right heart pressure. 6. Emphysema and aortic atherosclerosis. Aortic Atherosclerosis (ICD10-I70.0) and Emphysema (ICD10-J43.9). Electronically Signed   By: Fidela Salisbury MD   On: 10/14/2019 21:53   DG Chest Port 1 View  Result Date: 10/14/2019 CLINICAL DATA:  Dyspnea, short of breath EXAM: PORTABLE CHEST 1 VIEW COMPARISON:  01/07/2017, CT 07/15/2019 FINDINGS: Hyperinflation with emphysematous disease. Consolidation in the right lower lobe, appears progressed since chest CT from May. Right paratracheal opacity presumably corresponds to the spiculated density on  CT. Stable cardiomediastinal silhouette. No pneumothorax. Wirelike density at the right thoracic inlet for which clinical correlation is recommended. IMPRESSION: 1. Increased consolidation and airspace disease in the right lower lobe compared to CT from May 2021 2. Emphysematous disease. 3. Right medial apical ill-defined opacity, likely corresponds to the spiculated density on CT, potentially representing scar or neoplasm. Electronically Signed   By: Donavan Foil M.D.   On: 10/14/2019 18:02     Scheduled Meds:  aspirin EC  81 mg Oral Daily   atorvastatin  10 mg Oral QHS   cholecalciferol  2,000 Units Oral Daily   feeding supplement (ENSURE ENLIVE)  237 mL Oral TID BM   gabapentin  300 mg Oral Daily   gabapentin  600 mg Oral QHS   heparin  5,000 Units Subcutaneous Q8H   ipratropium-albuterol  3 mL Nebulization Q6H   omega-3 acid ethyl esters  1 g Oral BID   pantoprazole  40 mg Oral Daily   Continuous Infusions:  piperacillin-tazobactam (ZOSYN)  IV 3.375 g (10/16/19 0907)   vancomycin 500 mg (10/15/19 2157)     LOS: 2 days    Time Spent in minutes   45 minutes  Thorsten Climer D.O. on 10/16/2019 at 9:45 AM  Between 7am to 7pm - Please see pager noted on [email or web address]  After 7pm go to [email or web address]  And look for the night coverage person covering for me after hours  [facility] Office  [phone]

## 2019-10-16 NOTE — Progress Notes (Signed)
Pharmacy Antibiotic Note  Curtis Gentry is a 74 y.o. male admitted on 10/14/2019 with pneumonia possibly due to aspiration.  Pharmacy has been consulted for vancomycin and zosyn dosing, now deescalated to amipicillin-sulbactam. Of note, patient has a necrotizing right-sided lung mass concerning for bronchogenic carcinoma but he has declined work-up for it.   Pt is afebrile and WBC is 11. SCr remains stable at <1 and lactic acid is <2. He uses 2.5L of oxygen at home but is requiring 4L, which may be his new baseline per note.   Plan: D/c vancomycin and cefepime Initiate ampicillin-sulbactam 1.5g IV every 6 hours F/u renal fxn and clinical status F/u with de-escalation to Augmentin x2 weeks at discharge   Height: 5\' 8"  (172.7 cm) Weight: 46.4 kg (102 lb 4.7 oz) IBW/kg (Calculated) : 68.4  Temp (24hrs), Avg:98 F (36.7 C), Min:97.7 F (36.5 C), Max:98.2 F (36.8 C)  Recent Labs  Lab 10/14/19 1739 10/14/19 1940 10/15/19 0244  WBC 10.9*  --  11.0*  CREATININE 0.93  --  0.81  LATICACIDVEN 2.7* 1.9  --     Estimated Creatinine Clearance: 52.5 mL/min (by C-G formula based on SCr of 0.81 mg/dL).    No Known Allergies  Antimicrobials this admission: Vanc 8/20>>8/22 Cefepime 8/20>>8/21 Zosyn 8/21>>8/22 Unasyn 8/22>>  Dose adjustments this admission: N/A  Microbiology results: 8/20 BCx ngtd 8/22 Strep pneumo Ag negative  Thank you for involving pharmacy in this patient's care. Mercy Riding, PharmD PGY1 Acute Care Pharmacy Resident Please refer to [facility] for unit-specific pharmacist

## 2019-10-16 NOTE — Progress Notes (Signed)
Ship broker received referral for pt to participate in [facility] palliative program once pt discharges. Called pt's Daughter, Lynelle Smoke, to confirm interest. Discussed, in detail, the difference between palliative vs hospice. Plan is for pt to dc home with PT/OT via [place] to gain strength and then to transition to hospice.   Liaison to follow while in hospital to alert [facility] palliative team who will then outreach [name] to arrange Palliative admission visit.     Please do not hesitate to call with any questions and thank you for the referral.     Freddie Breech, RN  [facility] Liaison   [phone]

## 2019-10-16 NOTE — TOC Initial Note (Addendum)
Transition of Care (TOC) - Initial/Assessment Note  Marvetta Gibbons RN, BSN Transitions of Care Unit 4E- RN Case Manager See Treatment Team for direct phone # Weekend Coverage   Patient Details  Name: Curtis Gentry MRN: [id number] Date of Birth: 1945/09/29  Transition of Care [facility]) CM/SW Contact:    Dawayne Patricia, RN Phone Number: 10/16/2019, 10:31 AM  Clinical Narrative:                 Noted referral for home hospice, went by room no family present and pt sleeping. Reached out to daughter Curtis Gentry- per TC conversation pt lives at home alone however [name] reports she has moved in with him recently to assist. Pt has home 02 baseline 2L- [name] does not remember agency name that supplies home 02 but will find out and relay info back to this writer tomorrow AM. Pt also has RW and w/c at home- does not want hospital bed at this time- no other DME needs noted per [name].  Discussed Home with Hospice per [name] she would like to continue to treat the treatable especially any PNA with abxs. With this info discussed Hospice vs PC at home and at this time [name] is leaning more towards PC with [facility] services - offered choice for both PC needs and [facility]- Per CMS guidelines from medicare.gov website with star ratings per [name] she does not have a preference for either- she is fine with referral to [facility] for PC needs as she is familiar with them- no preference for [facility] agency. She would like RN/PT/OT/aide if MD agreeable along with the PC follow up at home for.  Daughter also asked about ramp for home- info provided on where she might rent one. Daughter to f/u on this on her own.  Attending notified of transition plan for [facility] and outpt PC. Will plan for transition home tomorrow if pt medically stable.  Orders pending for [facility]  Call made to Bradd Canary with [facility] for outpt PC needs- they will follow up post discharge with daughter.   Call made to [facility] with [facility] for [facility] needs- Unable to accept at this  time.  [facility] - unable to accept managed medicare [facility]- unable to accept [facility]- pending- await return call-- accepted referral [facility]- accepting insurance however are on a 5 day delay for start of care  Will continue to reach out to other [facility] agencies in the AM when they are open to call regarding referral needs.   1515- update- [facility] returned call- per Karsten Fells they will be able to accept referral- RN/PT/OT/aide- pending orders- they will reach out to daughter for start of care within 48 hr post discharge.   Expected Discharge Plan: [place] Barriers to Discharge: Continued Medical Work up   Patient Goals and CMS Choice Patient states their goals for this hospitalization and ongoing recovery are:: return home with family CMS Medicare.gov Compare Post Acute Care list provided to:: Patient Choice offered to / list presented to : Adult Children  Expected Discharge Plan and Services Expected Discharge Plan: [place]   Discharge Planning Services: CM Consult Post Acute Care Choice: Home Health, Hospice, Durable Medical Equipment Living arrangements for the past 2 months: Single Family Home                           HH Arranged: RN, PT, OT, Nurse's Aide  Prior Living Arrangements/Services Living arrangements for the past 2 months: Single Family Home Lives with:: Self, Adult Children Patient language and need for interpreter reviewed:: Yes Do you feel safe going back to the place where you live?: Yes      Need for Family Participation in Patient Care: Yes (Comment) Care giver support system in place?: Yes (comment) Current home services: DME (home 02, RW, W/C) Criminal Activity/Legal Involvement Pertinent to Current Situation/Hospitalization: No - Comment as needed  Activities of Daily Living   ADL Screening (condition at time of admission) Patient's cognitive ability adequate to safely complete daily activities?:  Yes Is the patient deaf or have difficulty hearing?: Yes Does the patient have difficulty seeing, even when wearing glasses/contacts?: No Does the patient have difficulty concentrating, remembering, or making decisions?: Yes Patient able to express need for assistance with ADLs?: Yes Does the patient have difficulty dressing or bathing?: Yes Independently performs ADLs?: No Communication: Independent  Permission Sought/Granted Permission sought to share information with : Chartered certified accountant granted to share information with : Yes, Verbal Permission Granted  Share Information with NAME: Curtis Gentry  Permission granted to share info w AGENCY: PC, [place] agencies  Permission granted to share info w Relationship: Daughter     Emotional Assessment Appearance:: Appears stated age Attitude/Demeanor/Rapport: Unable to Assess (sleeping) Affect (typically observed): Unable to Assess   Alcohol / Substance Use: Not Applicable Psych Involvement: No (comment)  Admission diagnosis:  Acute respiratory failure (HCC) [J96.00] Hypoxia [R09.02] Dyspnea, unspecified type [R06.00] Acute respiratory failure with hypoxia ([place]) [J96.01] Patient Active Problem List   Diagnosis Date Noted  . Acute respiratory failure ([place]) 10/14/2019  . Lung mass 10/14/2019  . CAP (community acquired pneumonia) 10/14/2019  . Acute respiratory failure with hypoxia ([place]) 10/14/2019  . Tobacco abuse 10/03/2016  . BPH (benign prostatic hyperplasia) 10/03/2016  . SDH (subdural hematoma) ([place]) 10/03/2016  . Slurred speech 10/03/2016  . Hyperlipidemia   . Anxiety   . 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 Abnormality of gait 05/03/2015  . Hereditary and idiopathic peripheral neuropathy 05/03/2015  . Syncope and collapse 08/20/2011  . ANEMIA-UNSPECIFIED 09/06/2008  . DYSPHAGIA 09/06/2008   PCP:  [name], MD Pharmacy:   [place] ([place]), [place] - [address] [place] ([place]) [place] [address] Phone: [phone] Fax: [phone]     Social Determinants of Health (SDOH) Interventions    Readmission Risk Interventions No flowsheet data found.

## 2019-10-17 LAB — LEGIONELLA PNEUMOPHILA SEROGP 1 UR AG: L. pneumophila Serogp 1 Ur Ag: NEGATIVE

## 2019-10-17 MED ORDER — GUAIFENESIN 100 MG/5ML PO SOLN: 5.0000 mL | ORAL | 0 refills | Status: AC | PRN

## 2019-10-17 MED ORDER — ENSURE ENLIVE PO LIQD: 1.0000 | Freq: Three times a day (TID) | ORAL | 0 refills | Status: AC

## 2019-10-17 MED ORDER — AMOXICILLIN-POT CLAVULANATE 875-125 MG PO TABS: 1.0000 | ORAL_TABLET | Freq: Two times a day (BID) | ORAL | 0 refills | Status: DC

## 2019-10-17 MED ORDER — AMOXICILLIN-POT CLAVULANATE 875-125 MG PO TABS: 1.0000 | ORAL_TABLET | Freq: Two times a day (BID) | ORAL | 0 refills | Status: AC

## 2019-10-17 NOTE — Discharge Summary (Signed)
Physician Discharge Summary  Curtis Gentry KDX:[id number] DOB: 1945-04-19 DOA: 10/14/2019  PCP: [name], MD  Admit date: 10/14/2019 Discharge date: 10/17/2019  Time spent: 45 minutes  Recommendations for Outpatient Follow-up:  Patient will be discharged to home with health services and palliative care to follow.  Patient will need to follow up with primary care provider within one week of discharge.  May follow-up with Dr. Valeta Harms, pulmonology.  Patient should continue medications as prescribed.  Patient should follow a regular diet.   Discharge Diagnoses:  Acute on chronic hypoxic respiratory failure secondary to necrotizing pneumonia Right-sided mass  Hyperlipidemia COPD Tobacco abuse Chronic pain Severe protein calorie malnutrition Chronic anemia   Discharge Condition: Stable  Diet recommendation: Regular  Filed Weights   10/14/19 2337 10/15/19 0700 10/17/19 0424  Weight: 47.7 kg 46.4 kg 45.9 kg    History of present illness:  on 10/14/2019 by Dr. Allene Pyo Brownis a 74 y.o.malewithhistory of tobacco abuse, hyperlipidemia, recurrent syncope and possible COPD was brought to the ER after patient became more short of breath. Patient states he has been short of breath for last few weeks and was treated for pneumonia by his primary care physician. Despite which he was getting more short of breath EMS was called and was brought to the ER. Initially patient was on nonrebreather. Denies chest pain. Has benign productive cough. Has been having poor appetite and has lost weight recently.  Hospital Course:  Acute on chronic hypoxic respiratory failure secondary to necrotizing pneumonia -Patient uses 2.5 L of oxygen chronically -Patient required 9 L nonrebreather mask while in the emergency department -CT angiogram of the chest shows increasing size of right-sided spiculated mass concerning for bronchogenic carcinoma as well as concern for necrotizing  pneumonia -It seems the patient was recently treated by his PCP for pneumonia-currently on empiric antibiotics vancomycin and Zosyn- was transitioned to [facility] -Pulmonology consulted and appreciated-discussed with Dr. Valeta Harms on 10/15/2019, likely due to some aspiration, recommended discharging patient with 2 weeks of Augmentin -Strep pneumonia antigen negative -Legionella urine antigen pending -Pulmonology consulted -Unable to wean back to baseline oxygen amount.  Suspect new baseline of 5L  Right-sided mass  -concerning for bronchogenic pneumonia as noted on the CT scan -Pulmonology consulted and appreciated -Discussed with pulmonology, Dr. Valeta Harms, on 10/15/2019, patient declined work-up for mass.  Plan is for home with hospice versus residential hospice.  Consult has been placed and pending.  He has also agreed to be patient's attending physician with hospice.  Hyperlipidemia -Continue statin  COPD -No active wheezing noted -Continue nebulizer treatments as needed  Tobacco abuse -Discussed cessation  Chronic pain -Continue hydrocodone and gabapentin  Severe protein calorie malnutrition -Nutrition consulted-continue supplements as well as liberalize diet to regular  Chronic anemia  -stable, continue to monitor CBC  Goals of care -Discussed with daughter.  Will continue to treat his pneumonia.  Will have palliative care follow and when needed will transition to hospice.  -Currently DNR  Code status: DNR  Consultants Pulmonology  Procedures  None  Discharge Exam: Vitals:   10/17/19 0739 10/17/19 0748  BP:  122/72  Pulse:  79  Resp:  19  Temp:  98.2 F (36.8 C)  SpO2: 90% 92%     General: Well developed, chronically ill-appearing, NAD  HEENT: NCAT,  mucous membranes moist.  Cardiovascular: S1 S2 auscultated, RRR  Respiratory: Diminished breath sounds  Abdomen: Soft, nontender, nondistended, + bowel sounds  Extremities: warm dry without cyanosis  clubbing or  edema  Neuro: AAOx3, nonfocal  Psych: pleasant, appropriate mood and affect  Discharge Instructions Discharge Instructions    Diet - low sodium heart healthy   Complete by: As directed    Discharge instructions   Complete by: As directed    Patient will be discharged to home with health services and palliative care to follow.  Patient will need to follow up with primary care provider within one week of discharge.  May follow-up with Dr. Valeta Harms, pulmonology.  Patient should continue medications as prescribed.  Patient should follow a regular diet.   Increase activity slowly   Complete by: As directed      Allergies as of 10/17/2019   No Known Allergies     Medication List    TAKE these medications   amoxicillin-clavulanate 875-125 MG tablet Commonly known as: Augmentin Take 1 tablet by mouth 2 (two) times daily for 14 days.   Anoro Ellipta 62.5-25 MCG/INH Aepb Generic drug: umeclidinium-vilanterol Inhale 1 puff into the lungs daily.   aspirin EC 81 MG tablet Take 81 mg by mouth daily.   atorvastatin 10 MG tablet Commonly known as: LIPITOR Take 10 mg by mouth at bedtime.   diazepam 5 MG tablet Commonly known as: VALIUM Take 2.5-5 mg by mouth as needed for anxiety (EVERY 8-12 HOURS AS NEEDED FOR ANXIETY).   diclofenac sodium 1 % Gel Commonly known as: Voltaren Apply 2 g 4 (four) times daily topically. What changed:   when to take this  reasons to take this   feeding supplement (ENSURE ENLIVE) Liqd Take 237 mLs by mouth 3 (three) times daily between meals.   FISH OIL PO Take 1,200 mg by mouth 2 (two) times daily.   gabapentin 300 MG capsule Commonly known as: NEURONTIN Take 300-600 mg by mouth 2 (two) times daily as needed (nerve pain). 300mg  in AM and 600mg  QHS   guaiFENesin 100 MG/5ML Soln Commonly known as: ROBITUSSIN Take 5 mLs (100 mg total) by mouth every 4 (four) hours as needed for cough or to loosen phlegm.   HYDROcodone-acetaminophen  5-325 MG tablet Commonly known as: NORCO/VICODIN Take 1 tablet by mouth daily as needed for moderate pain.   ibuprofen 200 MG tablet Commonly known as: ADVIL Take 200-400 mg by mouth every 6 (six) hours as needed for fever or moderate pain.   multivitamin with minerals Tabs tablet Take 1 tablet by mouth daily.   naproxen sodium 220 MG tablet Commonly known as: ALEVE Take 220 mg by mouth 2 (two) times daily as needed (pain).   pantoprazole 40 MG tablet Commonly known as: PROTONIX Take 1 tablet by mouth daily.   senna-docusate 8.6-50 MG tablet Commonly known as: Senokot-S Take 1 tablet by mouth at bedtime as needed for mild constipation.   Vitamin D 50 MCG (2000 UT) tablet Take 2,000 Units by mouth daily.            Durable Medical Equipment  (From admission, onward)         Start     Ordered   10/17/19 1018  For home use only DME oxygen  Once       Question Answer Comment  Length of Need Lifetime   Mode or (Route) Nasal cannula   Liters per Minute 5   Frequency Continuous (stationary and portable oxygen unit needed)   Oxygen conserving device Yes   Oxygen delivery system Gas      10/17/19 1018         No Known  Allergies  Follow-up Information    [facility] up.   Why: referral made for outpt Palliative care needs- they will follow up post discharge Contact information: [place] [address] [place], [place] Follow up.   Specialty: Home Health Services Contact information: [address] [place] [address] [phone]        [name], MD. Schedule an appointment as soon as possible for a visit in 1 week(s).   Specialty: Internal Medicine Why: hospital follow up Contact information: [place] [address] [phone]        Jerline Pain, MD .   Specialty: Cardiology Contact information: [phone] [address] [place] [address] [phone]        Garner Nash, DO. Schedule an appointment as soon as possible for a visit in 1 week(s).   Specialty: Pulmonary Disease Why: Hospital follow up Contact information: [place] [address] [phone]                The results of significant diagnostics from this hospitalization (including imaging, microbiology, ancillary and laboratory) are listed below for reference.    Significant Diagnostic Studies: CT Angio Chest PE W and/or Wo Contrast  Result Date: 10/14/2019 CLINICAL DATA:  Pulmonary embolism, dyspnea, hypoxia EXAM: CT ANGIOGRAPHY CHEST WITH CONTRAST TECHNIQUE: Multidetector CT imaging of the chest was performed using the standard protocol during bolus administration of intravenous contrast. Multiplanar CT image reconstructions and MIPs were obtained to evaluate the vascular anatomy. CONTRAST:  162mL OMNIPAQUE IOHEXOL 350 MG/ML SOLN COMPARISON:  None. FINDINGS: Cardiovascular: There is excellent opacification of the pulmonary arterial tree. The central pulmonary arteries are enlarged in keeping with changes of pulmonary arterial hypertension. There is, however, no intraluminal filling defect identified to suggest acute pulmonary embolism. There is mild global cardiomegaly with particular enlargement of the right ventricle and right atrium in keeping with elevated right heart pressure. Moderate multi-vessel coronary artery calcification. No pericardial effusion. Mild scattered atherosclerotic calcification is seen within the thoracic aorta, which is of normal caliber. Mediastinum/Nodes: There is shotty right hilar and subcarinal lymphadenopathy which may be reactive in nature. No frankly pathologic thoracic adenopathy. Thyroid gland unremarkable. Esophagus unremarkable. Lungs/Pleura: There is severe centrilobular emphysema again identified. Spiculated mass within the right apex demonstrates interval increase in  size measuring 18 x 20 mm at axial image # 27/8 there is dense consolidation of the right lower lobe with areas of cavitation and hypoenhancement in keeping with areas of necrosis no discrete drainable fluid collection identified. There is consolidation also noted within the lateral segment of the right middle lobe, though to a lesser degree. There is convex expansion of the right major pleura raising the question of Klebsiella pneumonia as an underlying etiology. Upper Abdomen: No acute abnormality. Musculoskeletal: No chest wall abnormality. No acute or significant osseous findings. Review of the MIP images confirms the above findings. IMPRESSION: 1. No evidence of acute pulmonary embolism. 2. Interval increase in size of spiculated mass within the right apex, now measuring 18 x 20 mm, suspicious for a primary bronchogenic malignancy. This may be amenable to tissue sampling by navigational bronchoscopy. 3. Right lower lobe necrotizing pneumonia. No discrete drainable fluid collection. Convex expansion of the right major pleura raise the question of a Klebsiella as an underlying organism. Milder consolidation within the right middle lobe. 4. Severe centrilobular  emphysema. 5. Enlarged central pulmonary arteries and right heart in keeping with changes of pulmonary arterial hypertension and elevated right heart pressure. 6. Emphysema and aortic atherosclerosis. Aortic Atherosclerosis (ICD10-I70.0) and Emphysema (ICD10-J43.9). Electronically Signed   By: Fidela Salisbury MD   On: 10/14/2019 21:53   DG Chest Port 1 View  Result Date: 10/14/2019 CLINICAL DATA:  Dyspnea, short of breath EXAM: PORTABLE CHEST 1 VIEW COMPARISON:  01/07/2017, CT 07/15/2019 FINDINGS: Hyperinflation with emphysematous disease. Consolidation in the right lower lobe, appears progressed since chest CT from May. Right paratracheal opacity presumably corresponds to the spiculated density on CT. Stable cardiomediastinal silhouette. No  pneumothorax. Wirelike density at the right thoracic inlet for which clinical correlation is recommended. IMPRESSION: 1. Increased consolidation and airspace disease in the right lower lobe compared to CT from May 2021 2. Emphysematous disease. 3. Right medial apical ill-defined opacity, likely corresponds to the spiculated density on CT, potentially representing scar or neoplasm. Electronically Signed   By: Donavan Foil M.D.   On: 10/14/2019 18:02    Microbiology: Recent Results (from the past 240 hour(s))  SARS Coronavirus 2 by RT PCR (hospital order, performed in [facility] hospital lab) Nasopharyngeal Nasopharyngeal Swab     Status: None   Collection Time: 10/14/19  5:10 PM   Specimen: Nasopharyngeal Swab  Result Value Ref Range Status   SARS Coronavirus 2 NEGATIVE NEGATIVE Final    Comment: (NOTE) SARS-CoV-2 target nucleic acids are NOT DETECTED.  The SARS-CoV-2 RNA is generally detectable in upper and lower respiratory specimens during the acute phase of infection. The lowest concentration of SARS-CoV-2 viral copies this assay can detect is 250 copies / mL. A negative result does not preclude SARS-CoV-2 infection and should not be used as the sole basis for treatment or other patient management decisions.  A negative result may occur with improper specimen collection / handling, submission of specimen other than nasopharyngeal swab, presence of viral mutation(s) within the areas targeted by this assay, and inadequate number of viral copies (<250 copies / mL). A negative result must be combined with clinical observations, patient history, and epidemiological information.  Fact Sheet for Patients:   StrictlyIdeas.no  Fact Sheet for Healthcare Providers: BankingDealers.co.za  This test is not yet approved or  cleared by the [place] FDA and has been authorized for detection and/or diagnosis of SARS-CoV-2 by FDA under an  Emergency Use Authorization (EUA).  This EUA will remain in effect (meaning this test can be used) for the duration of the COVID-19 declaration under Section 564(b)(1) of the Act, 21 U.S.C. section 360bbb-3(b)(1), unless the authorization is terminated or revoked sooner.  Performed at [facility], [place] [address], [place], [place] [address]   Culture, blood (routine x 2)     Status: None (Preliminary result)   Collection Time: 10/14/19  5:39 PM   Specimen: BLOOD LEFT FOREARM  Result Value Ref Range Status   Specimen Description BLOOD LEFT FOREARM  Final   Special Requests   Final    BOTTLES DRAWN AEROBIC AND ANAEROBIC Blood Culture results may not be optimal due to an inadequate volume of blood received in culture bottles   Culture   Final    NO GROWTH 3 DAYS Performed at [facility], [place] [address] [place], [place] [address]    Report Status PENDING  Incomplete  Culture, blood (routine x 2)     Status: None (Preliminary result)   Collection Time: 10/14/19  7:40 PM  Specimen: BLOOD  Result Value Ref Range Status   Specimen Description BLOOD SITE NOT SPECIFIED  Final   Special Requests   Final    BOTTLES DRAWN AEROBIC AND ANAEROBIC Blood Culture adequate volume   Culture   Final    NO GROWTH 3 DAYS Performed at [facility], [address], [place], [place] [address]    Report Status PENDING  Incomplete     Labs: Basic Metabolic Panel: Recent Labs  Lab 10/14/19 1732 10/14/19 1739 10/15/19 0244  NA 139 139 137  K 3.7 4.3 4.1  CL  --  109 107  CO2  --  22 22  GLUCOSE  --  110* 94  BUN  --  26* 23  CREATININE  --  0.93 0.81  CALCIUM  --  8.7* 8.1*   Liver Function Tests: Recent Labs  Lab 10/14/19 1739  AST 26  ALT 20  ALKPHOS 120  BILITOT 0.5  PROT 5.2*  ALBUMIN 1.9*   No results for input(s): LIPASE, AMYLASE in the last 168 hours. No results for input(s): AMMONIA in the last 168 hours. CBC: Recent Labs  Lab 10/14/19 1732  10/14/19 1739 10/15/19 0244  WBC  --  10.9* 11.0*  NEUTROABS  --  8.1*  --   HGB 10.9* 13.2 11.3*  HCT 32.0* 40.4 33.3*  MCV  --  108.6* 105.0*  PLT  --  366 349   Cardiac Enzymes: No results for input(s): CKTOTAL, CKMB, CKMBINDEX, TROPONINI in the last 168 hours. BNP: BNP (last 3 results) Recent Labs    10/14/19 1739  BNP 107.3*    ProBNP (last 3 results) No results for input(s): PROBNP in the last 8760 hours.  CBG: No results for input(s): GLUCAP in the last 168 hours.     Signed:  Cristal Ford  [facility] 10/17/2019, 10:20 AM

## 2019-10-17 NOTE — Care Management Important Message (Signed)
Important Message  Patient Details  Name: Curtis Gentry MRN: [id number] Date of Birth: 05/12/1945   Medicare Important Message Given:  Yes     Shelda Altes 10/17/2019, 11:45 AM

## 2019-10-17 NOTE — Discharge Instructions (Signed)
Hypoxia Hypoxia is a condition that happens when there is a lack of oxygen in the body's tissues and organs. When there is not enough oxygen, organs cannot work as they should. This causes serious problems throughout the body and in the brain. What are the causes? This condition may be caused by:  Exposure to high altitude.  A collapsed lung (pneumothorax).  Lung infection (pneumonia).  Lung injury.  Long-term (chronic) lung disease, such as COPD (chronic obstructive pulmonary disease).  Blood collecting in the chest cavity (hemothorax).  Food, saliva, or vomit getting into the airway (aspiration).  Reduced blood flow (ischemia).  Severe blood loss.  Slow or shallow breathing (hypoventilation).  Blood disorders, such as anemia.  Carbon monoxide poisoning.  The heart suddenly stopping (cardiac arrest).  Anesthetic medicines.  Drowning.  Choking. What are the signs or symptoms? Symptoms of this condition include:  Headache.  Fatigue.  Drowsiness.  Forgetfulness.  Nausea.  Confusion.  Shortness of breath.  Dizziness.  Bluish color of the skin, lips, or nail beds (cyanosis).  Change in consciousness or awareness. If hypoxia is not treated, it can lead to convulsions, loss of consciousness (coma), or brain damage. How is this diagnosed? This condition may be diagnosed based on:  A physical exam.  Blood tests.  A test that measures how much oxygen is in your blood (pulse oximetry). This is done with a sensor that is placed on your finger, toe, or earlobe.  Chest X-ray.  Tests to check your lung function (pulmonary function tests).  A test to check the electrical activity of your heart (electrocardiogram, ECG). You may have other tests to determine the cause of your hypoxia. How is this treated?  Treatment for this condition depends on what is causing the hypoxia. You will likely be treated with oxygen therapy. This may be done by giving you oxygen  through a face mask or through tubes in your nose. Your health care provider may also recommend other therapies to treat the underlying cause of your hypoxia. Follow these instructions at home:  Take over-the-counter and prescription medicines only as told by your health care provider.  Do not use any products that contain nicotine or tobacco, such as cigarettes and e-cigarettes. If you need help quitting, ask your health care provider.  Avoid secondhand smoke.  Work with your health care provider to manage any chronic conditions you have that may be causing hypoxia, such as COPD.  Keep all follow-up visits as told by your health care provider. This is important. Contact a health care provider if:  You have a fever.  You have trouble breathing, even after treatment.  You become extremely short of breath when you exercise. Get help right away if:  Your shortness of breath gets worse, especially with normal or very little activity.  Your skin, lips, or nail beds have a bluish color.  You become confused or you cannot think properly.  You have chest pain. Summary  Hypoxia is a condition that happens when there is a lack of oxygen in the body's tissues and organs.  If hypoxia is not treated, it can lead to convulsions, loss of consciousness (coma), or brain damage.  Symptoms of hypoxia can include a headache, shortness of breath, confusion, nausea, and a bluish skin color.  Hypoxia has many possible causes, including exposure to high altitude, carbon monoxide poisoning, or other health issues, such as blood disorders or cardiac arrest.  Hypoxia is usually treated with oxygen therapy. This information is not   intended to replace advice given to you by your health care provider. Make sure you discuss any questions you have with your health care provider. Document Revised: 01/23/2017 Document Reviewed: 03/31/2016 Elsevier Patient Education  2020 Elsevier Inc.  

## 2019-10-17 NOTE — Progress Notes (Signed)
D/C instructions given and reviewed. Questions answered and encouraged to call with any f/u questions. IV and tele removed. Tolerated well.

## 2019-10-17 NOTE — Plan of Care (Signed)

## 2019-10-17 NOTE — TOC Transition Note (Addendum)
Transition of Care [facility]) - CM/SW Discharge Note   Patient Details  Name: ECTOR LAUREL MRN: [id number] Date of Birth: [birth year]  Transition of Care [facility] CM/SW Contact:  Zenon Mayo, RN Phone Number: 10/17/2019, 12:42 PM   Clinical Narrative:    Patient is for dc today home with [facility] with Jackquline Denmark, [place] notified.  Also Maryann with  [facility] notified for outpatient palliative services.  NCM contacted Tanya at [facility] for new oxygen order and new oxygen set up.  Patient's daughter , Lynelle Smoke will be at the hospital, Huey Romans will bring tanks her so she can take patient home and then they will set up at the home.    Final next level of care: [place] Barriers to Discharge: No Barriers Identified   Patient Goals and CMS Choice Patient states their goals for this hospitalization and ongoing recovery are:: get better CMS Medicare.gov Compare Post Acute Care list provided to:: Patient Choice offered to / list presented to : Adult Children  Discharge Placement                       Discharge Plan and Services   Discharge Planning Services: CM Consult Post Acute Care Choice: Home Health, Hospice, Durable Medical Equipment          DME Arranged: Oxygen DME Agency: [place] Date DME Agency Contacted: 10/17/19 Time DME Agency Contacted: [phone] Representative spoke with at DME Agency: Lavella Lemons HH Arranged: RN, PT, OT, Nurse's Aide [place] Agency: [facility] Date [place]: 10/16/19 Time [place]: [phone] Representative spoke with at [place]: [place] [employer]) Interventions     Readmission Risk Interventions No flowsheet data found.

## 2019-10-17 NOTE — Progress Notes (Signed)
Patient was discharged home by MD order w/ O2 tank ; discharged instructions  Reviewed and given  to patient with care notes; IV DIC; skin intact; patient will be escorted to the car by nurse tech via wheelchair.

## 2019-10-19 LAB — CULTURE, BLOOD (ROUTINE X 2)
Culture: NO GROWTH
Culture: NO GROWTH
Special Requests: ADEQUATE

## 2019-10-21 DIAGNOSIS — D649 Anemia, unspecified: Secondary | ICD-10-CM | POA: Diagnosis not present

## 2019-10-21 DIAGNOSIS — M1990 Unspecified osteoarthritis, unspecified site: Secondary | ICD-10-CM | POA: Diagnosis not present

## 2019-10-21 DIAGNOSIS — L89891 Pressure ulcer of other site, stage 1: Secondary | ICD-10-CM | POA: Diagnosis not present

## 2019-10-21 DIAGNOSIS — E46 Unspecified protein-calorie malnutrition: Secondary | ICD-10-CM | POA: Diagnosis not present

## 2019-10-21 DIAGNOSIS — R918 Other nonspecific abnormal finding of lung field: Secondary | ICD-10-CM | POA: Diagnosis not present

## 2019-10-21 DIAGNOSIS — G8929 Other chronic pain: Secondary | ICD-10-CM | POA: Diagnosis not present

## 2019-10-21 DIAGNOSIS — N400 Enlarged prostate without lower urinary tract symptoms: Secondary | ICD-10-CM | POA: Diagnosis not present

## 2019-10-21 DIAGNOSIS — J850 Gangrene and necrosis of lung: Secondary | ICD-10-CM | POA: Diagnosis not present

## 2019-10-21 DIAGNOSIS — M4712 Other spondylosis with myelopathy, cervical region: Secondary | ICD-10-CM | POA: Diagnosis not present

## 2019-10-21 DIAGNOSIS — J9621 Acute and chronic respiratory failure with hypoxia: Secondary | ICD-10-CM | POA: Diagnosis not present

## 2019-10-21 DIAGNOSIS — J9611 Chronic respiratory failure with hypoxia: Secondary | ICD-10-CM | POA: Diagnosis not present

## 2019-10-21 DIAGNOSIS — R4702 Dysphasia: Secondary | ICD-10-CM | POA: Diagnosis not present

## 2019-10-21 DIAGNOSIS — L89151 Pressure ulcer of sacral region, stage 1: Secondary | ICD-10-CM | POA: Diagnosis not present

## 2019-10-21 DIAGNOSIS — E43 Unspecified severe protein-calorie malnutrition: Secondary | ICD-10-CM | POA: Diagnosis not present

## 2019-10-21 DIAGNOSIS — J449 Chronic obstructive pulmonary disease, unspecified: Secondary | ICD-10-CM | POA: Diagnosis not present

## 2019-10-25 DIAGNOSIS — M1990 Unspecified osteoarthritis, unspecified site: Secondary | ICD-10-CM | POA: Diagnosis not present

## 2019-10-25 DIAGNOSIS — G8929 Other chronic pain: Secondary | ICD-10-CM | POA: Diagnosis not present

## 2019-10-25 DIAGNOSIS — J449 Chronic obstructive pulmonary disease, unspecified: Secondary | ICD-10-CM | POA: Diagnosis not present

## 2019-10-25 DIAGNOSIS — D649 Anemia, unspecified: Secondary | ICD-10-CM | POA: Diagnosis not present

## 2019-10-25 DIAGNOSIS — E43 Unspecified severe protein-calorie malnutrition: Secondary | ICD-10-CM | POA: Diagnosis not present

## 2019-10-25 DIAGNOSIS — L89151 Pressure ulcer of sacral region, stage 1: Secondary | ICD-10-CM | POA: Diagnosis not present

## 2019-10-25 DIAGNOSIS — J9611 Chronic respiratory failure with hypoxia: Secondary | ICD-10-CM | POA: Diagnosis not present

## 2019-10-25 DIAGNOSIS — N400 Enlarged prostate without lower urinary tract symptoms: Secondary | ICD-10-CM | POA: Diagnosis not present

## 2019-10-25 DIAGNOSIS — M4712 Other spondylosis with myelopathy, cervical region: Secondary | ICD-10-CM | POA: Diagnosis not present

## 2019-10-25 DIAGNOSIS — L89891 Pressure ulcer of other site, stage 1: Secondary | ICD-10-CM | POA: Diagnosis not present

## 2019-10-26 ENCOUNTER — Telehealth: Payer: Self-pay | Admitting: Nurse Practitioner

## 2019-10-26 DIAGNOSIS — L89891 Pressure ulcer of other site, stage 1: Secondary | ICD-10-CM | POA: Diagnosis not present

## 2019-10-26 DIAGNOSIS — M4712 Other spondylosis with myelopathy, cervical region: Secondary | ICD-10-CM | POA: Diagnosis not present

## 2019-10-26 DIAGNOSIS — J449 Chronic obstructive pulmonary disease, unspecified: Secondary | ICD-10-CM | POA: Diagnosis not present

## 2019-10-26 DIAGNOSIS — N400 Enlarged prostate without lower urinary tract symptoms: Secondary | ICD-10-CM | POA: Diagnosis not present

## 2019-10-26 DIAGNOSIS — L89151 Pressure ulcer of sacral region, stage 1: Secondary | ICD-10-CM | POA: Diagnosis not present

## 2019-10-26 DIAGNOSIS — D649 Anemia, unspecified: Secondary | ICD-10-CM | POA: Diagnosis not present

## 2019-10-26 DIAGNOSIS — E43 Unspecified severe protein-calorie malnutrition: Secondary | ICD-10-CM | POA: Diagnosis not present

## 2019-10-26 DIAGNOSIS — M1990 Unspecified osteoarthritis, unspecified site: Secondary | ICD-10-CM | POA: Diagnosis not present

## 2019-10-26 DIAGNOSIS — G8929 Other chronic pain: Secondary | ICD-10-CM | POA: Diagnosis not present

## 2019-10-26 DIAGNOSIS — J9611 Chronic respiratory failure with hypoxia: Secondary | ICD-10-CM | POA: Diagnosis not present

## 2019-10-26 NOTE — Telephone Encounter (Signed)
Spoke with patient's daughter, Lynelle Smoke, regarding Palliative services and all questions were answered and she was in agreement with scheduling a visit.  I have scheduled an In-person Consult for 12/01/19 @ 3 PM.  Daughter requested this visit to be scheduled out due to patient is receiving home health services and they wanted to focus on that at this time.  They were instructed to call us if something changes and patient needs to be seen sooner

## 2019-10-27 DIAGNOSIS — N400 Enlarged prostate without lower urinary tract symptoms: Secondary | ICD-10-CM | POA: Diagnosis not present

## 2019-10-27 DIAGNOSIS — M1990 Unspecified osteoarthritis, unspecified site: Secondary | ICD-10-CM | POA: Diagnosis not present

## 2019-10-27 DIAGNOSIS — M4712 Other spondylosis with myelopathy, cervical region: Secondary | ICD-10-CM | POA: Diagnosis not present

## 2019-10-27 DIAGNOSIS — L89151 Pressure ulcer of sacral region, stage 1: Secondary | ICD-10-CM | POA: Diagnosis not present

## 2019-10-27 DIAGNOSIS — G8929 Other chronic pain: Secondary | ICD-10-CM | POA: Diagnosis not present

## 2019-10-27 DIAGNOSIS — D649 Anemia, unspecified: Secondary | ICD-10-CM | POA: Diagnosis not present

## 2019-10-27 DIAGNOSIS — E43 Unspecified severe protein-calorie malnutrition: Secondary | ICD-10-CM | POA: Diagnosis not present

## 2019-10-27 DIAGNOSIS — J9611 Chronic respiratory failure with hypoxia: Secondary | ICD-10-CM | POA: Diagnosis not present

## 2019-10-27 DIAGNOSIS — J449 Chronic obstructive pulmonary disease, unspecified: Secondary | ICD-10-CM | POA: Diagnosis not present

## 2019-10-27 DIAGNOSIS — L89891 Pressure ulcer of other site, stage 1: Secondary | ICD-10-CM | POA: Diagnosis not present

## 2019-11-01 DIAGNOSIS — J90 Pleural effusion, not elsewhere classified: Secondary | ICD-10-CM | POA: Diagnosis not present

## 2019-11-01 DIAGNOSIS — J441 Chronic obstructive pulmonary disease with (acute) exacerbation: Secondary | ICD-10-CM | POA: Diagnosis not present

## 2019-11-02 DIAGNOSIS — L89151 Pressure ulcer of sacral region, stage 1: Secondary | ICD-10-CM | POA: Diagnosis not present

## 2019-11-02 DIAGNOSIS — M4712 Other spondylosis with myelopathy, cervical region: Secondary | ICD-10-CM | POA: Diagnosis not present

## 2019-11-02 DIAGNOSIS — N400 Enlarged prostate without lower urinary tract symptoms: Secondary | ICD-10-CM | POA: Diagnosis not present

## 2019-11-02 DIAGNOSIS — E43 Unspecified severe protein-calorie malnutrition: Secondary | ICD-10-CM | POA: Diagnosis not present

## 2019-11-02 DIAGNOSIS — J9611 Chronic respiratory failure with hypoxia: Secondary | ICD-10-CM | POA: Diagnosis not present

## 2019-11-02 DIAGNOSIS — J449 Chronic obstructive pulmonary disease, unspecified: Secondary | ICD-10-CM | POA: Diagnosis not present

## 2019-11-02 DIAGNOSIS — D649 Anemia, unspecified: Secondary | ICD-10-CM | POA: Diagnosis not present

## 2019-11-02 DIAGNOSIS — M1990 Unspecified osteoarthritis, unspecified site: Secondary | ICD-10-CM | POA: Diagnosis not present

## 2019-11-02 DIAGNOSIS — L89891 Pressure ulcer of other site, stage 1: Secondary | ICD-10-CM | POA: Diagnosis not present

## 2019-11-02 DIAGNOSIS — G8929 Other chronic pain: Secondary | ICD-10-CM | POA: Diagnosis not present

## 2019-11-07 DIAGNOSIS — J9611 Chronic respiratory failure with hypoxia: Secondary | ICD-10-CM | POA: Diagnosis not present

## 2019-11-07 DIAGNOSIS — M4712 Other spondylosis with myelopathy, cervical region: Secondary | ICD-10-CM | POA: Diagnosis not present

## 2019-11-07 DIAGNOSIS — L89151 Pressure ulcer of sacral region, stage 1: Secondary | ICD-10-CM | POA: Diagnosis not present

## 2019-11-07 DIAGNOSIS — G8929 Other chronic pain: Secondary | ICD-10-CM | POA: Diagnosis not present

## 2019-11-07 DIAGNOSIS — J449 Chronic obstructive pulmonary disease, unspecified: Secondary | ICD-10-CM | POA: Diagnosis not present

## 2019-11-07 DIAGNOSIS — N400 Enlarged prostate without lower urinary tract symptoms: Secondary | ICD-10-CM | POA: Diagnosis not present

## 2019-11-07 DIAGNOSIS — D649 Anemia, unspecified: Secondary | ICD-10-CM | POA: Diagnosis not present

## 2019-11-07 DIAGNOSIS — L89891 Pressure ulcer of other site, stage 1: Secondary | ICD-10-CM | POA: Diagnosis not present

## 2019-11-07 DIAGNOSIS — M1990 Unspecified osteoarthritis, unspecified site: Secondary | ICD-10-CM | POA: Diagnosis not present

## 2019-11-07 DIAGNOSIS — E43 Unspecified severe protein-calorie malnutrition: Secondary | ICD-10-CM | POA: Diagnosis not present

## 2019-11-08 DIAGNOSIS — M1990 Unspecified osteoarthritis, unspecified site: Secondary | ICD-10-CM | POA: Diagnosis not present

## 2019-11-08 DIAGNOSIS — J449 Chronic obstructive pulmonary disease, unspecified: Secondary | ICD-10-CM | POA: Diagnosis not present

## 2019-11-08 DIAGNOSIS — J9611 Chronic respiratory failure with hypoxia: Secondary | ICD-10-CM | POA: Diagnosis not present

## 2019-11-08 DIAGNOSIS — D649 Anemia, unspecified: Secondary | ICD-10-CM | POA: Diagnosis not present

## 2019-11-08 DIAGNOSIS — E43 Unspecified severe protein-calorie malnutrition: Secondary | ICD-10-CM | POA: Diagnosis not present

## 2019-11-08 DIAGNOSIS — M4712 Other spondylosis with myelopathy, cervical region: Secondary | ICD-10-CM | POA: Diagnosis not present

## 2019-11-08 DIAGNOSIS — L89891 Pressure ulcer of other site, stage 1: Secondary | ICD-10-CM | POA: Diagnosis not present

## 2019-11-08 DIAGNOSIS — L89151 Pressure ulcer of sacral region, stage 1: Secondary | ICD-10-CM | POA: Diagnosis not present

## 2019-11-08 DIAGNOSIS — N400 Enlarged prostate without lower urinary tract symptoms: Secondary | ICD-10-CM | POA: Diagnosis not present

## 2019-11-08 DIAGNOSIS — G8929 Other chronic pain: Secondary | ICD-10-CM | POA: Diagnosis not present

## 2019-11-14 DIAGNOSIS — L89891 Pressure ulcer of other site, stage 1: Secondary | ICD-10-CM | POA: Diagnosis not present

## 2019-11-14 DIAGNOSIS — G8929 Other chronic pain: Secondary | ICD-10-CM | POA: Diagnosis not present

## 2019-11-14 DIAGNOSIS — J9611 Chronic respiratory failure with hypoxia: Secondary | ICD-10-CM | POA: Diagnosis not present

## 2019-11-14 DIAGNOSIS — L89151 Pressure ulcer of sacral region, stage 1: Secondary | ICD-10-CM | POA: Diagnosis not present

## 2019-11-14 DIAGNOSIS — E43 Unspecified severe protein-calorie malnutrition: Secondary | ICD-10-CM | POA: Diagnosis not present

## 2019-11-14 DIAGNOSIS — M1990 Unspecified osteoarthritis, unspecified site: Secondary | ICD-10-CM | POA: Diagnosis not present

## 2019-11-14 DIAGNOSIS — J449 Chronic obstructive pulmonary disease, unspecified: Secondary | ICD-10-CM | POA: Diagnosis not present

## 2019-11-14 DIAGNOSIS — M4712 Other spondylosis with myelopathy, cervical region: Secondary | ICD-10-CM | POA: Diagnosis not present

## 2019-11-14 DIAGNOSIS — N400 Enlarged prostate without lower urinary tract symptoms: Secondary | ICD-10-CM | POA: Diagnosis not present

## 2019-11-14 DIAGNOSIS — D649 Anemia, unspecified: Secondary | ICD-10-CM | POA: Diagnosis not present

## 2019-11-15 DIAGNOSIS — M4712 Other spondylosis with myelopathy, cervical region: Secondary | ICD-10-CM | POA: Diagnosis not present

## 2019-11-15 DIAGNOSIS — G8929 Other chronic pain: Secondary | ICD-10-CM | POA: Diagnosis not present

## 2019-11-15 DIAGNOSIS — L89151 Pressure ulcer of sacral region, stage 1: Secondary | ICD-10-CM | POA: Diagnosis not present

## 2019-11-15 DIAGNOSIS — J449 Chronic obstructive pulmonary disease, unspecified: Secondary | ICD-10-CM | POA: Diagnosis not present

## 2019-11-15 DIAGNOSIS — E43 Unspecified severe protein-calorie malnutrition: Secondary | ICD-10-CM | POA: Diagnosis not present

## 2019-11-15 DIAGNOSIS — M1990 Unspecified osteoarthritis, unspecified site: Secondary | ICD-10-CM | POA: Diagnosis not present

## 2019-11-15 DIAGNOSIS — L89891 Pressure ulcer of other site, stage 1: Secondary | ICD-10-CM | POA: Diagnosis not present

## 2019-11-15 DIAGNOSIS — D649 Anemia, unspecified: Secondary | ICD-10-CM | POA: Diagnosis not present

## 2019-11-15 DIAGNOSIS — N400 Enlarged prostate without lower urinary tract symptoms: Secondary | ICD-10-CM | POA: Diagnosis not present

## 2019-11-15 DIAGNOSIS — J9611 Chronic respiratory failure with hypoxia: Secondary | ICD-10-CM | POA: Diagnosis not present

## 2019-11-17 DIAGNOSIS — E43 Unspecified severe protein-calorie malnutrition: Secondary | ICD-10-CM | POA: Diagnosis not present

## 2019-11-17 DIAGNOSIS — M4712 Other spondylosis with myelopathy, cervical region: Secondary | ICD-10-CM | POA: Diagnosis not present

## 2019-11-17 DIAGNOSIS — J9611 Chronic respiratory failure with hypoxia: Secondary | ICD-10-CM | POA: Diagnosis not present

## 2019-11-17 DIAGNOSIS — N400 Enlarged prostate without lower urinary tract symptoms: Secondary | ICD-10-CM | POA: Diagnosis not present

## 2019-11-17 DIAGNOSIS — M1990 Unspecified osteoarthritis, unspecified site: Secondary | ICD-10-CM | POA: Diagnosis not present

## 2019-11-17 DIAGNOSIS — G8929 Other chronic pain: Secondary | ICD-10-CM | POA: Diagnosis not present

## 2019-11-17 DIAGNOSIS — L89151 Pressure ulcer of sacral region, stage 1: Secondary | ICD-10-CM | POA: Diagnosis not present

## 2019-11-17 DIAGNOSIS — L89891 Pressure ulcer of other site, stage 1: Secondary | ICD-10-CM | POA: Diagnosis not present

## 2019-11-17 DIAGNOSIS — J449 Chronic obstructive pulmonary disease, unspecified: Secondary | ICD-10-CM | POA: Diagnosis not present

## 2019-11-17 DIAGNOSIS — D649 Anemia, unspecified: Secondary | ICD-10-CM | POA: Diagnosis not present

## 2019-11-21 DIAGNOSIS — J9611 Chronic respiratory failure with hypoxia: Secondary | ICD-10-CM | POA: Diagnosis not present

## 2019-11-21 DIAGNOSIS — D649 Anemia, unspecified: Secondary | ICD-10-CM | POA: Diagnosis not present

## 2019-11-21 DIAGNOSIS — M4712 Other spondylosis with myelopathy, cervical region: Secondary | ICD-10-CM | POA: Diagnosis not present

## 2019-11-21 DIAGNOSIS — J449 Chronic obstructive pulmonary disease, unspecified: Secondary | ICD-10-CM | POA: Diagnosis not present

## 2019-11-21 DIAGNOSIS — G8929 Other chronic pain: Secondary | ICD-10-CM | POA: Diagnosis not present

## 2019-11-21 DIAGNOSIS — E43 Unspecified severe protein-calorie malnutrition: Secondary | ICD-10-CM | POA: Diagnosis not present

## 2019-11-21 DIAGNOSIS — L89151 Pressure ulcer of sacral region, stage 1: Secondary | ICD-10-CM | POA: Diagnosis not present

## 2019-11-21 DIAGNOSIS — N400 Enlarged prostate without lower urinary tract symptoms: Secondary | ICD-10-CM | POA: Diagnosis not present

## 2019-11-21 DIAGNOSIS — L89891 Pressure ulcer of other site, stage 1: Secondary | ICD-10-CM | POA: Diagnosis not present

## 2019-11-21 DIAGNOSIS — M1990 Unspecified osteoarthritis, unspecified site: Secondary | ICD-10-CM | POA: Diagnosis not present

## 2019-11-22 DIAGNOSIS — L89891 Pressure ulcer of other site, stage 1: Secondary | ICD-10-CM | POA: Diagnosis not present

## 2019-11-22 DIAGNOSIS — L89151 Pressure ulcer of sacral region, stage 1: Secondary | ICD-10-CM | POA: Diagnosis not present

## 2019-11-22 DIAGNOSIS — M1990 Unspecified osteoarthritis, unspecified site: Secondary | ICD-10-CM | POA: Diagnosis not present

## 2019-11-22 DIAGNOSIS — J9611 Chronic respiratory failure with hypoxia: Secondary | ICD-10-CM | POA: Diagnosis not present

## 2019-11-22 DIAGNOSIS — D649 Anemia, unspecified: Secondary | ICD-10-CM | POA: Diagnosis not present

## 2019-11-22 DIAGNOSIS — J449 Chronic obstructive pulmonary disease, unspecified: Secondary | ICD-10-CM | POA: Diagnosis not present

## 2019-11-22 DIAGNOSIS — E43 Unspecified severe protein-calorie malnutrition: Secondary | ICD-10-CM | POA: Diagnosis not present

## 2019-11-22 DIAGNOSIS — N400 Enlarged prostate without lower urinary tract symptoms: Secondary | ICD-10-CM | POA: Diagnosis not present

## 2019-11-22 DIAGNOSIS — M4712 Other spondylosis with myelopathy, cervical region: Secondary | ICD-10-CM | POA: Diagnosis not present

## 2019-11-22 DIAGNOSIS — G8929 Other chronic pain: Secondary | ICD-10-CM | POA: Diagnosis not present

## 2019-11-25 DIAGNOSIS — L89151 Pressure ulcer of sacral region, stage 1: Secondary | ICD-10-CM | POA: Diagnosis not present

## 2019-11-25 DIAGNOSIS — J9611 Chronic respiratory failure with hypoxia: Secondary | ICD-10-CM | POA: Diagnosis not present

## 2019-11-25 DIAGNOSIS — M4712 Other spondylosis with myelopathy, cervical region: Secondary | ICD-10-CM | POA: Diagnosis not present

## 2019-11-25 DIAGNOSIS — D649 Anemia, unspecified: Secondary | ICD-10-CM | POA: Diagnosis not present

## 2019-11-25 DIAGNOSIS — N400 Enlarged prostate without lower urinary tract symptoms: Secondary | ICD-10-CM | POA: Diagnosis not present

## 2019-11-25 DIAGNOSIS — M1990 Unspecified osteoarthritis, unspecified site: Secondary | ICD-10-CM | POA: Diagnosis not present

## 2019-11-25 DIAGNOSIS — J449 Chronic obstructive pulmonary disease, unspecified: Secondary | ICD-10-CM | POA: Diagnosis not present

## 2019-11-25 DIAGNOSIS — G8929 Other chronic pain: Secondary | ICD-10-CM | POA: Diagnosis not present

## 2019-11-25 DIAGNOSIS — E43 Unspecified severe protein-calorie malnutrition: Secondary | ICD-10-CM | POA: Diagnosis not present

## 2019-11-25 DIAGNOSIS — L89891 Pressure ulcer of other site, stage 1: Secondary | ICD-10-CM | POA: Diagnosis not present

## 2019-11-27 ENCOUNTER — Emergency Department (HOSPITAL_COMMUNITY): Payer: Medicare HMO

## 2019-11-27 ENCOUNTER — Encounter (HOSPITAL_COMMUNITY): Payer: Self-pay | Admitting: Emergency Medicine

## 2019-11-27 ENCOUNTER — Inpatient Hospital Stay (HOSPITAL_COMMUNITY)
Admission: EM | Admit: 2019-11-27 | Discharge: 2019-11-30 | DRG: 640 | Disposition: A | Discharge status: Hospice | Payer: Medicare HMO | Attending: Internal Medicine | Admitting: Internal Medicine

## 2019-11-27 ENCOUNTER — Other Ambulatory Visit: Payer: Self-pay

## 2019-11-27 DIAGNOSIS — E785 Hyperlipidemia, unspecified: Secondary | ICD-10-CM | POA: Diagnosis present

## 2019-11-27 DIAGNOSIS — Z825 Family history of asthma and other chronic lower respiratory diseases: Secondary | ICD-10-CM

## 2019-11-27 DIAGNOSIS — F419 Anxiety disorder, unspecified: Secondary | ICD-10-CM | POA: Diagnosis present

## 2019-11-27 DIAGNOSIS — D4411 Neoplasm of uncertain behavior of right adrenal gland: Secondary | ICD-10-CM | POA: Diagnosis present

## 2019-11-27 DIAGNOSIS — Z9981 Dependence on supplemental oxygen: Secondary | ICD-10-CM

## 2019-11-27 DIAGNOSIS — D381 Neoplasm of uncertain behavior of trachea, bronchus and lung: Secondary | ICD-10-CM | POA: Diagnosis present

## 2019-11-27 DIAGNOSIS — R64 Cachexia: Secondary | ICD-10-CM | POA: Diagnosis present

## 2019-11-27 DIAGNOSIS — R918 Other nonspecific abnormal finding of lung field: Secondary | ICD-10-CM | POA: Diagnosis present

## 2019-11-27 DIAGNOSIS — R0602 Shortness of breath: Secondary | ICD-10-CM | POA: Diagnosis not present

## 2019-11-27 DIAGNOSIS — Z716 Tobacco abuse counseling: Secondary | ICD-10-CM

## 2019-11-27 DIAGNOSIS — J9620 Acute and chronic respiratory failure, unspecified whether with hypoxia or hypercapnia: Secondary | ICD-10-CM | POA: Diagnosis not present

## 2019-11-27 DIAGNOSIS — R1310 Dysphagia, unspecified: Secondary | ICD-10-CM | POA: Diagnosis present

## 2019-11-27 DIAGNOSIS — J449 Chronic obstructive pulmonary disease, unspecified: Secondary | ICD-10-CM | POA: Diagnosis present

## 2019-11-27 DIAGNOSIS — J9621 Acute and chronic respiratory failure with hypoxia: Secondary | ICD-10-CM | POA: Diagnosis not present

## 2019-11-27 DIAGNOSIS — J9611 Chronic respiratory failure with hypoxia: Secondary | ICD-10-CM

## 2019-11-27 DIAGNOSIS — D638 Anemia in other chronic diseases classified elsewhere: Secondary | ICD-10-CM | POA: Diagnosis present

## 2019-11-27 DIAGNOSIS — J9600 Acute respiratory failure, unspecified whether with hypoxia or hypercapnia: Secondary | ICD-10-CM | POA: Diagnosis not present

## 2019-11-27 DIAGNOSIS — Z681 Body mass index (BMI) 19 or less, adult: Secondary | ICD-10-CM | POA: Diagnosis not present

## 2019-11-27 DIAGNOSIS — G609 Hereditary and idiopathic neuropathy, unspecified: Secondary | ICD-10-CM | POA: Diagnosis present

## 2019-11-27 DIAGNOSIS — Z20822 Contact with and (suspected) exposure to covid-19: Secondary | ICD-10-CM | POA: Diagnosis present

## 2019-11-27 DIAGNOSIS — D75839 Thrombocytosis, unspecified: Secondary | ICD-10-CM | POA: Diagnosis present

## 2019-11-27 DIAGNOSIS — D376 Neoplasm of uncertain behavior of liver, gallbladder and bile ducts: Secondary | ICD-10-CM | POA: Diagnosis present

## 2019-11-27 DIAGNOSIS — M2550 Pain in unspecified joint: Secondary | ICD-10-CM | POA: Diagnosis not present

## 2019-11-27 DIAGNOSIS — I959 Hypotension, unspecified: Secondary | ICD-10-CM

## 2019-11-27 DIAGNOSIS — Z7401 Bed confinement status: Secondary | ICD-10-CM | POA: Diagnosis not present

## 2019-11-27 DIAGNOSIS — K219 Gastro-esophageal reflux disease without esophagitis: Secondary | ICD-10-CM | POA: Diagnosis present

## 2019-11-27 DIAGNOSIS — E43 Unspecified severe protein-calorie malnutrition: Secondary | ICD-10-CM | POA: Diagnosis not present

## 2019-11-27 DIAGNOSIS — K5730 Diverticulosis of large intestine without perforation or abscess without bleeding: Secondary | ICD-10-CM | POA: Diagnosis not present

## 2019-11-27 DIAGNOSIS — Z903 Acquired absence of stomach [part of]: Secondary | ICD-10-CM

## 2019-11-27 DIAGNOSIS — R197 Diarrhea, unspecified: Secondary | ICD-10-CM | POA: Diagnosis not present

## 2019-11-27 DIAGNOSIS — A084 Viral intestinal infection, unspecified: Secondary | ICD-10-CM | POA: Diagnosis not present

## 2019-11-27 DIAGNOSIS — Z79899 Other long term (current) drug therapy: Secondary | ICD-10-CM

## 2019-11-27 DIAGNOSIS — E860 Dehydration: Secondary | ICD-10-CM | POA: Diagnosis not present

## 2019-11-27 DIAGNOSIS — E876 Hypokalemia: Secondary | ICD-10-CM

## 2019-11-27 DIAGNOSIS — C3411 Malignant neoplasm of upper lobe, right bronchus or lung: Secondary | ICD-10-CM | POA: Diagnosis not present

## 2019-11-27 DIAGNOSIS — R531 Weakness: Secondary | ICD-10-CM | POA: Diagnosis not present

## 2019-11-27 DIAGNOSIS — F17210 Nicotine dependence, cigarettes, uncomplicated: Secondary | ICD-10-CM | POA: Diagnosis present

## 2019-11-27 DIAGNOSIS — D539 Nutritional anemia, unspecified: Secondary | ICD-10-CM | POA: Diagnosis present

## 2019-11-27 DIAGNOSIS — R5381 Other malaise: Secondary | ICD-10-CM | POA: Diagnosis not present

## 2019-11-27 DIAGNOSIS — N400 Enlarged prostate without lower urinary tract symptoms: Secondary | ICD-10-CM | POA: Diagnosis present

## 2019-11-27 DIAGNOSIS — D4412 Neoplasm of uncertain behavior of left adrenal gland: Secondary | ICD-10-CM | POA: Diagnosis present

## 2019-11-27 DIAGNOSIS — Z789 Other specified health status: Secondary | ICD-10-CM

## 2019-11-27 DIAGNOSIS — J181 Lobar pneumonia, unspecified organism: Secondary | ICD-10-CM | POA: Diagnosis not present

## 2019-11-27 DIAGNOSIS — R69 Illness, unspecified: Secondary | ICD-10-CM | POA: Diagnosis not present

## 2019-11-27 DIAGNOSIS — Z66 Do not resuscitate: Secondary | ICD-10-CM | POA: Diagnosis not present

## 2019-11-27 DIAGNOSIS — D509 Iron deficiency anemia, unspecified: Secondary | ICD-10-CM | POA: Diagnosis present

## 2019-11-27 DIAGNOSIS — L89151 Pressure ulcer of sacral region, stage 1: Secondary | ICD-10-CM | POA: Diagnosis not present

## 2019-11-27 DIAGNOSIS — R55 Syncope and collapse: Secondary | ICD-10-CM | POA: Diagnosis not present

## 2019-11-27 DIAGNOSIS — Z720 Tobacco use: Secondary | ICD-10-CM | POA: Diagnosis present

## 2019-11-27 DIAGNOSIS — Z7189 Other specified counseling: Secondary | ICD-10-CM

## 2019-11-27 DIAGNOSIS — Z515 Encounter for palliative care: Secondary | ICD-10-CM | POA: Diagnosis not present

## 2019-11-27 DIAGNOSIS — Z981 Arthrodesis status: Secondary | ICD-10-CM

## 2019-11-27 DIAGNOSIS — R0902 Hypoxemia: Secondary | ICD-10-CM | POA: Diagnosis not present

## 2019-11-27 DIAGNOSIS — I700 Atherosclerosis of aorta: Secondary | ICD-10-CM | POA: Diagnosis not present

## 2019-11-27 DIAGNOSIS — Z8711 Personal history of peptic ulcer disease: Secondary | ICD-10-CM

## 2019-11-27 DIAGNOSIS — J9811 Atelectasis: Secondary | ICD-10-CM | POA: Diagnosis not present

## 2019-11-27 DIAGNOSIS — C3490 Malignant neoplasm of unspecified part of unspecified bronchus or lung: Secondary | ICD-10-CM | POA: Insufficient documentation

## 2019-11-27 DIAGNOSIS — Z7982 Long term (current) use of aspirin: Secondary | ICD-10-CM

## 2019-11-27 DIAGNOSIS — A419 Sepsis, unspecified organism: Secondary | ICD-10-CM | POA: Diagnosis not present

## 2019-11-27 LAB — CBC WITH DIFFERENTIAL/PLATELET
Abs Immature Granulocytes: 0.06 10*3/uL (ref 0.00–0.07)
Basophils Absolute: 0 10*3/uL (ref 0.0–0.1)
Basophils Relative: 0 %
Eosinophils Absolute: 0.1 10*3/uL (ref 0.0–0.5)
Eosinophils Relative: 1 %
HCT: 36.8 % — ABNORMAL LOW (ref 39.0–52.0)
Hemoglobin: 12.1 g/dL — ABNORMAL LOW (ref 13.0–17.0)
Immature Granulocytes: 1 %
Lymphocytes Relative: 12 %
Lymphs Abs: 1.3 10*3/uL (ref 0.7–4.0)
MCH: 33.6 pg (ref 26.0–34.0)
MCHC: 32.9 g/dL (ref 30.0–36.0)
MCV: 102.2 fL — ABNORMAL HIGH (ref 80.0–100.0)
Monocytes Absolute: 0.7 10*3/uL (ref 0.1–1.0)
Monocytes Relative: 7 %
Neutro Abs: 8.6 10*3/uL — ABNORMAL HIGH (ref 1.7–7.7)
Neutrophils Relative %: 79 %
Platelets: 495 10*3/uL — ABNORMAL HIGH (ref 150–400)
RBC: 3.6 MIL/uL — ABNORMAL LOW (ref 4.22–5.81)
RDW: 14.6 % (ref 11.5–15.5)
WBC: 10.8 10*3/uL — ABNORMAL HIGH (ref 4.0–10.5)
nRBC: 0 % (ref 0.0–0.2)

## 2019-11-27 LAB — PROTIME-INR
INR: 1.4 — ABNORMAL HIGH (ref 0.8–1.2)
Prothrombin Time: 16.9 seconds — ABNORMAL HIGH (ref 11.4–15.2)

## 2019-11-27 LAB — COMPREHENSIVE METABOLIC PANEL
ALT: 16 U/L (ref 0–44)
AST: 23 U/L (ref 15–41)
Albumin: 1.9 g/dL — ABNORMAL LOW (ref 3.5–5.0)
Alkaline Phosphatase: 112 U/L (ref 38–126)
Anion gap: 12 (ref 5–15)
BUN: 19 mg/dL (ref 8–23)
CO2: 20 mmol/L — ABNORMAL LOW (ref 22–32)
Calcium: 8.3 mg/dL — ABNORMAL LOW (ref 8.9–10.3)
Chloride: 107 mmol/L (ref 98–111)
Creatinine, Ser: 0.95 mg/dL (ref 0.61–1.24)
GFR calc Af Amer: >60 mL/min (ref >60)
GFR calc non Af Amer: >60 mL/min (ref >60)
Glucose, Bld: 107 mg/dL — ABNORMAL HIGH (ref 70–99)
Potassium: 3.1 mmol/L — ABNORMAL LOW (ref 3.5–5.1)
Sodium: 139 mmol/L (ref 135–145)
Total Bilirubin: 0.6 mg/dL (ref 0.3–1.2)
Total Protein: 5.4 g/dL — ABNORMAL LOW (ref 6.5–8.1)

## 2019-11-27 LAB — RESPIRATORY PANEL BY RT PCR (FLU A&B, COVID)
Influenza A by PCR: NEGATIVE
Influenza B by PCR: NEGATIVE
SARS Coronavirus 2 by RT PCR: NEGATIVE

## 2019-11-27 LAB — MAGNESIUM: Magnesium: 1.9 mg/dL (ref 1.7–2.4)

## 2019-11-27 LAB — LACTIC ACID, PLASMA: Lactic Acid, Venous: 1.9 mmol/L (ref 0.5–1.9)

## 2019-11-27 LAB — C DIFFICILE QUICK SCREEN W PCR REFLEX
C Diff antigen: NEGATIVE
C Diff interpretation: NOT DETECTED
C Diff toxin: NEGATIVE

## 2019-11-27 LAB — POC OCCULT BLOOD, ED
Fecal Occult Bld: POSITIVE — AB
Fecal Occult Bld: POSITIVE — AB

## 2019-11-27 LAB — APTT: aPTT: 39 seconds — ABNORMAL HIGH (ref 24–36)

## 2019-11-27 MED ORDER — ALBUTEROL SULFATE HFA 108 (90 BASE) MCG/ACT IN AERS
2.0000 | INHALATION_SPRAY | Freq: Four times a day (QID) | RESPIRATORY_TRACT | Status: DC | PRN
  Filled 2019-11-27: qty 6.7

## 2019-11-27 MED ORDER — ONDANSETRON HCL 4 MG PO TABS: 4.0000 mg | ORAL_TABLET | Freq: Four times a day (QID) | ORAL | Status: DC | PRN

## 2019-11-27 MED ORDER — ATORVASTATIN CALCIUM 10 MG PO TABS
10.0000 mg | ORAL_TABLET | Freq: Every day | ORAL | Status: DC
  Administered 2019-11-27 – 2019-11-29 (×3): 10 mg via ORAL
  Filled 2019-11-27 (×3): qty 1

## 2019-11-27 MED ORDER — ONDANSETRON HCL 4 MG/2ML IJ SOLN: 4.0000 mg | Freq: Four times a day (QID) | INTRAMUSCULAR | Status: DC | PRN

## 2019-11-27 MED ORDER — POTASSIUM CHLORIDE 10 MEQ/100ML IV SOLN
10.0000 meq | INTRAVENOUS | Status: AC
  Administered 2019-11-27 (×2): 10 meq via INTRAVENOUS
  Filled 2019-11-27 (×2): qty 100

## 2019-11-27 MED ORDER — SODIUM CHLORIDE 0.9 % IV SOLN: INTRAVENOUS | Status: DC

## 2019-11-27 MED ORDER — POTASSIUM CHLORIDE IN NACL 20-0.9 MEQ/L-% IV SOLN
INTRAVENOUS | Status: DC
  Filled 2019-11-27 (×5): qty 1000

## 2019-11-27 MED ORDER — ACETAMINOPHEN 650 MG RE SUPP: 650.0000 mg | Freq: Four times a day (QID) | RECTAL | Status: DC | PRN

## 2019-11-27 MED ORDER — FERROUS SULFATE 325 (65 FE) MG PO TABS
325.0000 mg | ORAL_TABLET | Freq: Every day | ORAL | Status: DC
  Administered 2019-11-28 – 2019-11-30 (×3): 325 mg via ORAL
  Filled 2019-11-27 (×3): qty 1

## 2019-11-27 MED ORDER — LOPERAMIDE HCL 2 MG PO CAPS
4.0000 mg | ORAL_CAPSULE | Freq: Once | ORAL | Status: AC
  Administered 2019-11-27: 4 mg via ORAL
  Filled 2019-11-27: qty 2

## 2019-11-27 MED ORDER — GABAPENTIN 300 MG PO CAPS
600.0000 mg | ORAL_CAPSULE | Freq: Every day | ORAL | Status: DC
  Administered 2019-11-27 – 2019-11-29 (×3): 600 mg via ORAL
  Filled 2019-11-27 (×3): qty 2

## 2019-11-27 MED ORDER — IOHEXOL 350 MG/ML SOLN
75.0000 mL | Freq: Once | INTRAVENOUS | Status: AC | PRN
  Administered 2019-11-27: 100 mL via INTRAVENOUS

## 2019-11-27 MED ORDER — UMECLIDINIUM-VILANTEROL 62.5-25 MCG/INH IN AEPB
1.0000 | INHALATION_SPRAY | Freq: Every day | RESPIRATORY_TRACT | Status: DC
  Administered 2019-11-28 – 2019-11-29 (×2): 1 via RESPIRATORY_TRACT
  Filled 2019-11-27: qty 14

## 2019-11-27 MED ORDER — SODIUM CHLORIDE 0.9 % IV BOLUS (SEPSIS)
2000.0000 mL | Freq: Once | INTRAVENOUS | Status: AC
  Administered 2019-11-27: 2000 mL via INTRAVENOUS

## 2019-11-27 MED ORDER — FENTANYL CITRATE (PF) 100 MCG/2ML IJ SOLN
25.0000 ug | Freq: Once | INTRAMUSCULAR | Status: AC
  Administered 2019-11-27: 25 ug via INTRAVENOUS
  Filled 2019-11-27: qty 2

## 2019-11-27 MED ORDER — PANTOPRAZOLE SODIUM 40 MG PO TBEC
40.0000 mg | DELAYED_RELEASE_TABLET | Freq: Every day | ORAL | Status: DC
  Administered 2019-11-28 – 2019-11-30 (×3): 40 mg via ORAL
  Filled 2019-11-27 (×3): qty 1

## 2019-11-27 MED ORDER — ACETAMINOPHEN 325 MG PO TABS
650.0000 mg | ORAL_TABLET | Freq: Four times a day (QID) | ORAL | Status: DC | PRN
  Administered 2019-11-28: 650 mg via ORAL
  Filled 2019-11-27: qty 2

## 2019-11-27 MED ORDER — SODIUM CHLORIDE 0.9 % IV SOLN: Freq: Once | INTRAVENOUS | Status: AC

## 2019-11-27 MED ORDER — ASPIRIN EC 81 MG PO TBEC
81.0000 mg | DELAYED_RELEASE_TABLET | Freq: Every day | ORAL | Status: DC
  Administered 2019-11-28 – 2019-11-30 (×3): 81 mg via ORAL
  Filled 2019-11-27 (×3): qty 1

## 2019-11-27 MED ORDER — ALBUTEROL SULFATE (2.5 MG/3ML) 0.083% IN NEBU: 2.5000 mg | INHALATION_SOLUTION | Freq: Four times a day (QID) | RESPIRATORY_TRACT | Status: DC | PRN

## 2019-11-27 MED ORDER — GABAPENTIN 300 MG PO CAPS
300.0000 mg | ORAL_CAPSULE | Freq: Every day | ORAL | Status: DC
  Administered 2019-11-28 – 2019-11-30 (×3): 300 mg via ORAL
  Filled 2019-11-27 (×3): qty 1

## 2019-11-27 MED ORDER — LOPERAMIDE HCL 2 MG PO CAPS
4.0000 mg | ORAL_CAPSULE | ORAL | Status: DC | PRN
  Administered 2019-11-28 (×3): 4 mg via ORAL
  Filled 2019-11-27 (×4): qty 2

## 2019-11-27 MED ORDER — GABAPENTIN 300 MG PO CAPS: 300.0000 mg | ORAL_CAPSULE | ORAL | Status: DC

## 2019-11-27 MED ORDER — DIAZEPAM 5 MG PO TABS
2.5000 mg | ORAL_TABLET | ORAL | Status: DC | PRN
  Administered 2019-11-27 – 2019-11-30 (×2): 2.5 mg via ORAL
  Filled 2019-11-27 (×2): qty 1

## 2019-11-27 NOTE — ED Notes (Signed)
Pt made aware that inserting a rectal tube was an option if pt was willing. Pt at the moment would prefer to not have rectal tube inserted unless necessary and would prefer to treat frequent loose stools with PRN order(s). This RN will continue to monitor.

## 2019-11-27 NOTE — H&P (Addendum)
History and Physical    Curtis Gentry URK:[id number] DOB: 09-07-1945 DOA: 11/27/2019  PCP: [name], MD  Patient coming from: home  I have personally briefly reviewed patient's old medical records in [place]  Chief Complaint: Diarrhea  HPI: Curtis Gentry is a 74 y.o. male with medical history significant of chronic hypoxemic respiratory failure-on 5 L of oxygen via nasal cannula secondary to underlying COPD, tobacco abuse, hyperlipidemia, protein calorie malnutrition, lung mass, GERD presents to emergency department with generalized weakness and diarrhea since 5 days.  Patient tells me that he has loose stools since 5 days which is watery, nonbloody, dark [place] in color, associated with mucus, foul-smelling, too many episodes to count associated with generalized weakness, no energy.  He denies vomiting, abdominal pain, nausea, fever, chills, urinary symptoms.  No recent sick contact.  He lives with his daughter and son-in-law at home.  Due to worsening loose stool and generalized weakness family called EMS.  Patient's blood pressure noted to be 80 systolic, IVF bolus given in route to ED.    Has chronic cough & shortness of breath however denies worsening respiratory symptoms, denies chest pain, wheezing, leg swelling, orthopnea, PND,  headache, blurry vision, syncope.  He is on 5 L of oxygen at home all the time, continues to smoke 1 pack of cigarettes per day, denies alcohol, illicit drug use.  He is DNR/DNI.  Refused further work-up for possible metastatic lung cancer in the past.  ED Course: Upon arrival to ED: Patient hypotensive, on 6 L of oxygen via nasal cannula, afebrile with leukocytosis of 10.8, H&H: 12.1/36.8, MCV: 102.2, platelet: 495, CMP shows potassium 3.1, CO2: 20, albumin: 1.9, magnesium: 1.9, POC occult blood positive, CT of: Negative, GI panel, COVID-19, BC, UC, UA: Pending.  CT chest, abdomen/pelvis shows 24 mm spiculated nodule in the right upper lobe  suspicious for primary bronchogenic neoplasm, mildly progressive.  14 mm hyper enhancing lesion in the posterior right hepatic lobe, raising concern for metastasis.  Bilateral adrenal nodules, progressive from May 2021, raising concern for metastatic disease.  Patient was given IV fluid bolus, KCl and Imodium in ED.  [facility] hospitalist consulted for admission for hypotension and dehydration.  Review of Systems: As per HPI otherwise negative.    Past Medical History:  Diagnosis Date  . Abnormality of gait 05/03/2015  . Anemia   . ANEMIA-UNSPECIFIED 09/06/2008   Qualifier: Diagnosis of  By: Henrene Pastor MD, Docia Chuck   . Anxiety   . Arthritis   . BPH (benign prostatic hyperplasia) 10/03/2016  . DYSPHAGIA 09/06/2008   Qualifier: Diagnosis of  By: Henrene Pastor MD, Docia Chuck   . Erectile dysfunction   . Hereditary and idiopathic peripheral neuropathy 05/03/2015  . Hyperlipidemia   . Hypokalemia 08/21/2011  . Peptic ulcer disease   . Perforated ulcer ([place])   . SDH (subdural hematoma) ([place]) 10/03/2016  . Slurred speech 10/03/2016  . 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 Syncope and collapse 08/20/2011  . Tobacco abuse 10/03/2016  . Unspecified disease of spinal cord    myelopahty, cervical spine    Past Surgical History:  Procedure Laterality Date  . BACK SURGERY  2011   3 level ACD with fusion and plating  . HEMORRHOID SURGERY    . PARTIAL GASTRECTOMY       reports that he has been smoking. He has a 50.00 pack-year smoking history. He uses smokeless tobacco. He reports previous alcohol use. He reports that he does not use drugs.  No Known Allergies  Family History  Problem Relation Age of Onset  . Stroke Mother   . COPD Father        ?  Marland Kitchen Heart attack Father   . CAD Father   . Diabetes Other        family members  . CAD Other        family members  . Colon cancer Neg Hx   . Neuropathy Neg Hx     Prior to Admission medications   Medication Sig Start Date End Date Taking? Authorizing  Provider  albuterol (PROVENTIL) (2.5 MG/3ML) 0.083% nebulizer solution Take 2.5 mg by nebulization every 6 (six) hours as needed for wheezing or shortness of breath.   Yes [provider]  albuterol (VENTOLIN HFA) 108 (90 Base) MCG/ACT inhaler Inhale 2 puffs into the lungs every 6 (six) hours as needed for wheezing or shortness of breath.   Yes [provider]  aspirin EC 81 MG tablet Take 81 mg by mouth daily.   Yes [provider]  atorvastatin (LIPITOR) 10 MG tablet Take 10 mg by mouth at bedtime.    Yes [provider]  Cholecalciferol (VITAMIN D) 2000 units tablet Take 2,000 Units by mouth daily.   Yes [provider]  diazepam (VALIUM) 5 MG tablet Take 2.5-5 mg by mouth See admin instructions. Take 1/2-1 tablet (2.5-5 mg) every other day as needed for anxiety 04/29/16  Yes [provider]  diclofenac sodium (VOLTAREN) 1 % GEL Apply 2 g 4 (four) times daily topically. Patient taking differently: Apply 2 g topically 4 (four) times daily as needed (pain).  01/08/17  Yes Volanda Napoleon, PA-C  diphenhydramine-acetaminophen (TYLENOL PM) 25-500 MG TABS tablet Take 1 tablet by mouth at bedtime as needed (pain/sleep).   Yes [provider]  ferrous sulfate 325 (65 FE) MG tablet Take 325 mg by mouth daily with breakfast.   Yes [provider]  gabapentin (NEURONTIN) 300 MG capsule Take 300-600 mg by mouth See admin instructions. Take one capsule (300 mg) by mouth every morning and two capsules (600 mg) at night   Yes [provider]  Homeopathic Products ([place]) LIQD Apply 1 application topically daily.   Yes [provider]  HYDROcodone-acetaminophen (NORCO/VICODIN) 5-325 MG tablet Take 1 tablet by mouth every 4 (four) hours as needed for moderate pain.  11/19/17  Yes [provider]  ibuprofen (ADVIL) 200 MG tablet Take 400 mg by mouth every 6 (six) hours as needed for fever or moderate pain.     Yes [provider]  MAGNESIUM PO Take 1 tablet by mouth daily.   Yes [provider]  MELATONIN PO Take 1 tablet by mouth at bedtime.   Yes [provider]  Multiple Vitamin (MULTIVITAMIN WITH MINERALS) TABS tablet Take 1 tablet by mouth daily.   Yes [provider]  naloxone (NARCAN) 4 MG/0.1ML LIQD nasal spray kit Place 1 spray into the nose once as needed (opioid overdose).   Yes [provider]  naproxen sodium (ALEVE) 220 MG tablet Take 220 mg by mouth 2 (two) times daily as needed (pain).   Yes [provider]  pantoprazole (PROTONIX) 40 MG tablet Take 40 mg by mouth daily.  08/14/17  Yes [provider]  umeclidinium-vilanterol (ANORO ELLIPTA) 62.5-25 MCG/INH AEPB Inhale 1 puff into the lungs daily.   Yes [provider]  guaiFENesin (ROBITUSSIN) 100 MG/5ML SOLN Take 5 mLs (100 mg total) by mouth every  4 (four) hours as needed for cough or to loosen phlegm. Patient not taking: Reported on 11/27/2019 10/17/19   Cristal Ford, DO  senna-docusate (SENOKOT-S) 8.6-50 MG tablet Take 1 tablet by mouth at bedtime as needed for mild constipation. Patient not taking: Reported on 11/27/2019 10/04/16   Mariel Aloe, MD    Physical Exam: Vitals:   11/27/19 1530 11/27/19 1545 11/27/19 1630 11/27/19 1700  BP: 101/70 113/76 102/75 106/80  Pulse: 78 86 75 79  Resp: (!) 30 20 (!) 23 (!) 27  Temp:      TempSrc:      SpO2: (!) 87% 91% (!) 88% 90%    Constitutional: NAD, calm, comfortable, on 6 L of oxygen via nasal cannula, appears very dehydrated, thin and lean, temporal wasting noted.  Communicating well.   Eyes: PERRL, lids and conjunctivae normal ENMT: Mucous membranes are dry. Posterior pharynx clear of any exudate or lesions.Normal dentition.  Neck: normal, supple, no masses, no thyromegaly Respiratory: clear to auscultation bilaterally, no wheezing, no crackles. Normal respiratory effort. No accessory muscle use.    Cardiovascular: Regular rate and rhythm, no murmurs / rubs / gallops. No extremity edema. 2+ pedal pulses. No carotid bruits.  Abdomen: Generalized abdominal tenderness positive, no guarding, no rigidity, no masses palpated. No hepatosplenomegaly. Bowel sounds positive.  Musculoskeletal: no clubbing / cyanosis. No joint deformity upper and lower extremities. Good ROM, no contractures. Normal muscle tone.  Skin: no rashes, lesions, ulcers. No induration Neurologic: CN 2-12 grossly intact. Sensation intact, DTR normal. Strength 5/5 in all 4.  Psychiatric: Normal judgment and insight. Alert and oriented x 3. Normal mood.    Labs on Admission: I have personally reviewed following labs and imaging studies  CBC: Recent Labs  Lab 11/27/19 1435  WBC 10.8*  NEUTROABS 8.6*  HGB 12.1*  HCT 36.8*  MCV 102.2*  PLT 381*   Basic Metabolic Panel: Recent Labs  Lab 11/27/19 1435  NA 139  K 3.1*  CL 107  CO2 20*  GLUCOSE 107*  BUN 19  CREATININE 0.95  CALCIUM 8.3*  MG 1.9   GFR: CrCl cannot be calculated (Unknown ideal weight.). Liver Function Tests: Recent Labs  Lab 11/27/19 1435  AST 23  ALT 16  ALKPHOS 112  BILITOT 0.6  PROT 5.4*  ALBUMIN 1.9*   No results for input(s): LIPASE, AMYLASE in the last 168 hours. No results for input(s): AMMONIA in the last 168 hours. Coagulation Profile: Recent Labs  Lab 11/27/19 1501  INR 1.4*   Cardiac Enzymes: No results for input(s): CKTOTAL, CKMB, CKMBINDEX, TROPONINI in the last 168 hours. BNP (last 3 results) No results for input(s): PROBNP in the last 8760 hours. HbA1C: No results for input(s): HGBA1C in the last 72 hours. CBG: No results for input(s): GLUCAP in the last 168 hours. Lipid Profile: No results for input(s): CHOL, HDL, LDLCALC, TRIG, CHOLHDL, LDLDIRECT in the last 72 hours. Thyroid Function Tests: No results for input(s): TSH, T4TOTAL, FREET4, T3FREE, THYROIDAB in the last 72 hours. Anemia Panel: No results for  input(s): VITAMINB12, FOLATE, FERRITIN, TIBC, IRON, RETICCTPCT in the last 72 hours. Urine analysis:    Component Value Date/Time   COLORURINE YELLOW 10/16/2019 0033   APPEARANCEUR CLEAR 10/16/2019 0033   LABSPEC 1.025 10/16/2019 0033   PHURINE 5.0 10/16/2019 0033   GLUCOSEU NEGATIVE 10/16/2019 0033   HGBUR NEGATIVE 10/16/2019 0033   BILIRUBINUR NEGATIVE 10/16/2019 0033   KETONESUR NEGATIVE 10/16/2019 0033   PROTEINUR NEGATIVE 10/16/2019 0033   UROBILINOGEN  0.2 08/20/2011 1933   NITRITE NEGATIVE 10/16/2019 0033   LEUKOCYTESUR NEGATIVE 10/16/2019 0033    Radiological Exams on Admission: CT Angio Chest PE W and/or Wo Contrast  Result Date: 11/27/2019 CLINICAL DATA:  Weakness, abdominal pain, diarrhea. Hypotension and hypoxia. Evaluate for PE. EXAM: CT ANGIOGRAPHY CHEST CT ABDOMEN AND PELVIS WITH CONTRAST TECHNIQUE: Multidetector CT imaging of the chest was performed using the standard protocol during bolus administration of intravenous contrast. Multiplanar CT image reconstructions and MIPs were obtained to evaluate the vascular anatomy. Multidetector CT imaging of the abdomen and pelvis was performed using the standard protocol during bolus administration of intravenous contrast. CONTRAST:  137m OMNIPAQUE IOHEXOL 350 MG/ML SOLN COMPARISON:  CTA chest dated 10/14/2019.  CT chest dated 07/15/2019. FINDINGS: CTA CHEST FINDINGS Cardiovascular: Satisfactory opacification of the bilateral pulmonary arteries to the segmental level. No evidence of pulmonary embolism. Study is not tailored for evaluation of the thoracic aorta. No evidence of thoracic aortic aneurysm. Atherosclerotic calcifications of the aortic arch. The heart is top-normal in size.  No pericardial effusion. Coronary atherosclerosis of the LAD and right coronary artery. Mediastinum/Nodes: 16 mm short axis subcarinal node, grossly unchanged, possibly reactive. No suspicious hilar lymphadenopathy. Visualized thyroid is unremarkable.  Lungs/Pleura: 18 x 24 mm spiculated nodule in the right upper lobe (series 5/image 30), previously 18 x 20 mm, suspicious for primary bronchogenic neoplasm. Multifocal patchy opacities posterior right upper lobe, right middle lobe, and right lower lobe. This is progressive from the prior, which was previously isolated to the right middle and lower lobes. While multifocal infection/pneumonia is possible, when correlating with multiple CTs, multifocal invasive adenocarcinoma with a lepidic growth pattern could demonstrate this appearance. Superimposed small right pleural effusion, mildly increased. Small left pleural effusion, new/increased. Associated left lower lobe atelectasis. Moderate to severe centrilobular and paraseptal emphysematous changes, upper lung predominant. 11 mm nodule at the left lung apex (series 5/image 14), unchanged, possibly reflecting apical pleural-parenchymal scarring. 6 mm nodule in the left lower lobe (series 5/image 89) and 12 mm nodule in the medial left lower lobe posterior to the aorta (series 5/image 77), previously 4 and 7 mm respectively, suspicious for metastases. No pneumothorax. Musculoskeletal: Mild degenerative changes of the thoracic spine. Review of the MIP images confirms the above findings. CT ABDOMEN and PELVIS FINDINGS Hepatobiliary: 14 mm hypoenhancing lesion in the posterior right hepatic lobe (series 8/image 22), raising concern for metastasis. Distended gallbladder, without associated inflammatory changes. No intrahepatic or extrahepatic ductal dilatation. Pancreas: Within normal limits. Spleen: Within normal limits. Adrenals/Urinary Tract: Low-density bilateral adrenal nodules, measuring up to 2.6 cm on the left and 2.2 cm on the right (series 8/images 34 and 36), progressive from 07/15/2019 and worrisome for metastatic disease. Kidneys are within normal limits.  No hydronephrosis. Layering 8 mm bladder calculus (series 8/image 85). Stomach/Bowel: Postsurgical  changes at the GE junction. Stomach is grossly unremarkable. No evidence of bowel obstruction. Appendix is not discretely visualized. Extensive colonic diverticulosis, without evidence of diverticulitis. Vascular/Lymphatic: No evidence of abdominal aortic aneurysm. Atherosclerotic calcifications of the abdominal aorta and branch vessels. No suspicious abdominopelvic lymphadenopathy. Reproductive: Prostate is unremarkable. Other: No abdominopelvic ascites. Musculoskeletal: Mild degenerative changes of the lumbar spine. Review of the MIP images confirms the above findings. IMPRESSION: No evidence of pulmonary embolism. 24 mm spiculated nodule in the right upper lobe, suspicious for primary bronchogenic neoplasm, mildly progressive. Mildly progressive nodules in the left lower lobe, suspicious for metastases. Multifocal patchy opacities in the right lung, progressive across multiple studies. Given  persistence, multifocal invasive adenocarcinoma demonstrating a lepidic growth pattern should be considered, although multifocal pneumonia is still possible. Associated small bilateral pleural effusions, new/progressive. Mildly prominent mediastinal node, grossly unchanged. 14 mm hypoenhancing lesion in the posterior right hepatic lobe, raising concern for metastasis. Bilateral adrenal nodules, progressive from May 2021, raising concern for metastatic disease. Aortic Atherosclerosis (ICD10-I70.0) and Emphysema (ICD10-J43.9). Electronically Signed   By: Julian Hy M.D.   On: 11/27/2019 17:39   CT ABDOMEN PELVIS W CONTRAST  Result Date: 11/27/2019 CLINICAL DATA:  Weakness, abdominal pain, diarrhea. Hypotension and hypoxia. Evaluate for PE. EXAM: CT ANGIOGRAPHY CHEST CT ABDOMEN AND PELVIS WITH CONTRAST TECHNIQUE: Multidetector CT imaging of the chest was performed using the standard protocol during bolus administration of intravenous contrast. Multiplanar CT image reconstructions and MIPs were obtained to evaluate the  vascular anatomy. Multidetector CT imaging of the abdomen and pelvis was performed using the standard protocol during bolus administration of intravenous contrast. CONTRAST:  138m OMNIPAQUE IOHEXOL 350 MG/ML SOLN COMPARISON:  CTA chest dated 10/14/2019.  CT chest dated 07/15/2019. FINDINGS: CTA CHEST FINDINGS Cardiovascular: Satisfactory opacification of the bilateral pulmonary arteries to the segmental level. No evidence of pulmonary embolism. Study is not tailored for evaluation of the thoracic aorta. No evidence of thoracic aortic aneurysm. Atherosclerotic calcifications of the aortic arch. The heart is top-normal in size.  No pericardial effusion. Coronary atherosclerosis of the LAD and right coronary artery. Mediastinum/Nodes: 16 mm short axis subcarinal node, grossly unchanged, possibly reactive. No suspicious hilar lymphadenopathy. Visualized thyroid is unremarkable. Lungs/Pleura: 18 x 24 mm spiculated nodule in the right upper lobe (series 5/image 30), previously 18 x 20 mm, suspicious for primary bronchogenic neoplasm. Multifocal patchy opacities posterior right upper lobe, right middle lobe, and right lower lobe. This is progressive from the prior, which was previously isolated to the right middle and lower lobes. While multifocal infection/pneumonia is possible, when correlating with multiple CTs, multifocal invasive adenocarcinoma with a lepidic growth pattern could demonstrate this appearance. Superimposed small right pleural effusion, mildly increased. Small left pleural effusion, new/increased. Associated left lower lobe atelectasis. Moderate to severe centrilobular and paraseptal emphysematous changes, upper lung predominant. 11 mm nodule at the left lung apex (series 5/image 14), unchanged, possibly reflecting apical pleural-parenchymal scarring. 6 mm nodule in the left lower lobe (series 5/image 89) and 12 mm nodule in the medial left lower lobe posterior to the aorta (series 5/image 77),  previously 4 and 7 mm respectively, suspicious for metastases. No pneumothorax. Musculoskeletal: Mild degenerative changes of the thoracic spine. Review of the MIP images confirms the above findings. CT ABDOMEN and PELVIS FINDINGS Hepatobiliary: 14 mm hypoenhancing lesion in the posterior right hepatic lobe (series 8/image 22), raising concern for metastasis. Distended gallbladder, without associated inflammatory changes. No intrahepatic or extrahepatic ductal dilatation. Pancreas: Within normal limits. Spleen: Within normal limits. Adrenals/Urinary Tract: Low-density bilateral adrenal nodules, measuring up to 2.6 cm on the left and 2.2 cm on the right (series 8/images 34 and 36), progressive from 07/15/2019 and worrisome for metastatic disease. Kidneys are within normal limits.  No hydronephrosis. Layering 8 mm bladder calculus (series 8/image 85). Stomach/Bowel: Postsurgical changes at the GE junction. Stomach is grossly unremarkable. No evidence of bowel obstruction. Appendix is not discretely visualized. Extensive colonic diverticulosis, without evidence of diverticulitis. Vascular/Lymphatic: No evidence of abdominal aortic aneurysm. Atherosclerotic calcifications of the abdominal aorta and branch vessels. No suspicious abdominopelvic lymphadenopathy. Reproductive: Prostate is unremarkable. Other: No abdominopelvic ascites. Musculoskeletal: Mild degenerative changes of the lumbar spine. Review  of the MIP images confirms the above findings. IMPRESSION: No evidence of pulmonary embolism. 24 mm spiculated nodule in the right upper lobe, suspicious for primary bronchogenic neoplasm, mildly progressive. Mildly progressive nodules in the left lower lobe, suspicious for metastases. Multifocal patchy opacities in the right lung, progressive across multiple studies. Given persistence, multifocal invasive adenocarcinoma demonstrating a lepidic growth pattern should be considered, although multifocal pneumonia is still  possible. Associated small bilateral pleural effusions, new/progressive. Mildly prominent mediastinal node, grossly unchanged. 14 mm hypoenhancing lesion in the posterior right hepatic lobe, raising concern for metastasis. Bilateral adrenal nodules, progressive from May 2021, raising concern for metastatic disease. Aortic Atherosclerosis (ICD10-I70.0) and Emphysema (ICD10-J43.9). Electronically Signed   By: Julian Hy M.D.   On: 11/27/2019 17:39   DG Chest Port 1 View  Result Date: 11/27/2019 CLINICAL DATA:  Questionable sepsis. EXAM: PORTABLE CHEST 1 VIEW COMPARISON:  CT and radiographs from 10/14/2019 FINDINGS: Increased consolidation in the right lower lobe. As before, intermixed lucencies within the area of consolidation. Similar right paratracheal opacity, likely related to spiculated mass seen on prior CT. Stable cardiomediastinal silhouette, partially obscured. Limited evaluation for right pleural effusion. No evidence of a left pleural effusion. No pneumothorax. Clips in the region of the gastroesophageal junction. IMPRESSION: 1. Increased consolidation within the right lower lobe, concerning for pneumonia. Intermixed lucencies within this consolidation, which is nonospecific by radiograph but may represent necrotizing pneumonia given findings on prior chest CT. Chest CT with contrast could further characterize, if clinically indicated. 2. Emphysematous disease. 3. Persistent right medial apical ill-defined opacity, likely correlating to the spiculated mass seen on prior CT. Electronically Signed   By: Margaretha Sheffield MD   On: 11/27/2019 15:15    EKG: Independently reviewed.  Normal sinus rhythm, prolonged QT interval.  No ST elevation or depression noted.  Assessment/Plan Principal Problem:   Dehydration Active Problems:   Macrocytic anemia   Hypokalemia   Hyperlipidemia   Anxiety   Tobacco abuse   BPH (benign prostatic hyperplasia)   Lung mass   Thrombocytosis   COPD (chronic  obstructive pulmonary disease) (HCC)   Chronic hypoxemic respiratory failure (HCC)   Dehydration secondary to profuse diarrhea -Patient presented with hypotension, dehydration, generalized weakness.  He is afebrile with leukocytosis of 10.8, lactic acid: WNL.  Patient received IV fluid bolus in ED with improvement in BP. -Reviewed CT abdomen/pelvis result. -C. difficile is negative.  GI panel is pending, COVID-19 pending, UA: Pending -Admit patient stepdown unit for close monitoring.  On telemetry -Continue IV fluids.  Zofran as needed for nausea and vomiting and Imodium as needed for loose stool. -Monitor vitals closely. -POC occult blood positive however patient refused GI consultation/further work-up such as endoscopy/colonoscopy.  Chronic hypoxemic respiratory failure: In the setting of COPD/metastatic bronchogenic mass?Marland Kitchen  On 5 L of oxygen via nasal cannulae at home. 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  Patient refused further work-up/treatment. -Continue home inhalers.  No wheezing noted on exam. -Consult palliative care  Macrocytic anemia: H&H is stable. -Continue to monitor. -Continue ferrous sulfate  Thrombocytosis: Platelet: 495 -Likely reactive.  Hypokalemia: Replenished.  Magnesium level: WNL.  Repeat BMP tomorrow a.m.  Hyperlipidemia: Continue statin  Tobacco abuse: Counseled about cessation  Anxiety: Continue Valium as needed  Prolonged QT interval: Reviewed EKG.  Replenish potassium.  Monitor closely on telemetry.  Protein calorie malnutrition: -Albumin: 1.9.  Consult dietitian  DVT prophylaxis: SCD Code Status: DNR/DNI Family Communication: Patient's daughter  present at bedside.  Plan of care discussed with patient in length and he verbalized understanding and agreed with it. Disposition Plan: Home in 1 to 2 days Consults called: Palliative care  admission status: Inpatient   Mckinley Jewel  MD [facility]  If 7PM- AM, please contact night-coverage [email or web address] Password [facility]  11/27/2019, 6:40 PM

## 2019-11-27 NOTE — ED Triage Notes (Addendum)
Pt to triage via [facility] from home.  Reports generalized weakness x 2 weeks.  Pt pale.  C/o abd pain and diarrhea.  Per EMS BP 80 palpated.  NS 650cc.  20 g R hand.  On arrival to triage pt hypotensive and low sats.  Placed on 2 liters .  Agricultural consultant notified.

## 2019-11-27 NOTE — ED Provider Notes (Addendum)
[facility] EMERGENCY DEPARTMENT Provider Note   CSN: [id number] Arrival date & time: 11/27/19  1356     History Chief Complaint  Patient presents with  . Weakness  . Abdominal Pain  . Diarrhea    Curtis Gentry is a 74 y.o. male.  The history is provided by the patient.  Abdominal Pain Pain location:  Generalized Pain quality: aching   Pain radiates to:  Does not radiate Pain severity:  Mild Onset quality:  Gradual Timing:  Intermittent Progression:  Waxing and waning Chronicity:  New Context comment:  Patient with history of COPD, lung mass, necrotizing pneumonia who presents to the ED with abdominal pain, diarrhea.  Not currently on antibiotics.  Had some antibiotics at the end of August.  Patient is in hospice.  DNR.  Diarrhea for 2 weeks., dehydrated Relieved by:  Nothing Worsened by:  Nothing Associated symptoms: diarrhea and fatigue   Associated symptoms: no chest pain, no chills, no constipation, no cough, no dysuria, no fever, no hematuria, no shortness of breath, no sore throat and no vomiting   Risk factors: multiple surgeries        Past Medical History:  Diagnosis Date  . Abnormality of gait 05/03/2015  . Anemia   . ANEMIA-UNSPECIFIED 09/06/2008   Qualifier: Diagnosis of  By: Henrene Pastor MD, Docia Chuck   . Anxiety   . Arthritis   . BPH (benign prostatic hyperplasia) 10/03/2016  . DYSPHAGIA 09/06/2008   Qualifier: Diagnosis of  By: Henrene Pastor MD, Docia Chuck   . Erectile dysfunction   . Hereditary and idiopathic peripheral neuropathy 05/03/2015  . Hyperlipidemia   . Hypokalemia 08/21/2011  . Peptic ulcer disease   . Perforated ulcer ([place])   . SDH (subdural hematoma) ([place]) 10/03/2016  . Slurred speech 10/03/2016  . 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 Syncope and collapse 08/20/2011  . Tobacco abuse 10/03/2016  . Unspecified disease of spinal cord    myelopahty, cervical spine    Patient Active Problem List   Diagnosis Date Noted  . Acute  respiratory failure ([place]) 10/14/2019  . Lung mass 10/14/2019  . CAP (community acquired pneumonia) 10/14/2019  . Acute respiratory failure with hypoxia ([place]) 10/14/2019  . Tobacco abuse 10/03/2016  . BPH (benign prostatic hyperplasia) 10/03/2016  . SDH (subdural hematoma) ([place]) 10/03/2016  . Slurred speech 10/03/2016  . Hyperlipidemia   . Anxiety   . 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 Abnormality of gait 05/03/2015  . Hereditary and idiopathic peripheral neuropathy 05/03/2015  . Syncope and collapse 08/20/2011  . ANEMIA-UNSPECIFIED 09/06/2008  . DYSPHAGIA 09/06/2008    Past Surgical History:  Procedure Laterality Date  . BACK SURGERY  2011   3 level ACD with fusion and plating  . HEMORRHOID SURGERY    . PARTIAL GASTRECTOMY         Family History  Problem Relation Age of Onset  . Stroke Mother   . COPD Father        ?  Marland Kitchen Heart attack Father   . CAD Father   . Diabetes Other        family members  . CAD Other        family members  . Colon cancer Neg Hx   . Neuropathy Neg Hx     Social History   Tobacco Use  . Smoking status: Current Every Day Smoker    Packs/day: 1.00    Years: 50.00    Pack years: 50.00  .  Smokeless tobacco: Current User  Vaping Use  . Vaping Use: Never used  Substance Use Topics  . Alcohol use: Not Currently  . Drug use: No    Home Medications Prior to Admission medications   Medication Sig Start Date End Date Taking? Authorizing Provider  albuterol (PROVENTIL) (2.5 MG/3ML) 0.083% nebulizer solution Take 2.5 mg by nebulization every 6 (six) hours as needed for wheezing or shortness of breath.   Yes [provider]  albuterol (VENTOLIN HFA) 108 (90 Base) MCG/ACT inhaler Inhale 2 puffs into the lungs every 6 (six) hours as needed for wheezing or shortness of breath.   Yes [provider]  aspirin EC 81 MG tablet Take 81 mg by mouth daily.   Yes [provider]  atorvastatin (LIPITOR) 10 MG  tablet Take 10 mg by mouth at bedtime.    Yes [provider]  Cholecalciferol (VITAMIN D) 2000 units tablet Take 2,000 Units by mouth daily.   Yes [provider]  diazepam (VALIUM) 5 MG tablet Take 2.5-5 mg by mouth See admin instructions. Take 1/2-1 tablet (2.5-5 mg) every other day as needed for anxiety 04/29/16  Yes [provider]  diclofenac sodium (VOLTAREN) 1 % GEL Apply 2 g 4 (four) times daily topically. Patient taking differently: Apply 2 g topically 4 (four) times daily as needed (pain).  01/08/17  Yes Volanda Napoleon, PA-C  diphenhydramine-acetaminophen (TYLENOL PM) 25-500 MG TABS tablet Take 1 tablet by mouth at bedtime as needed (pain/sleep).   Yes [provider]  ferrous sulfate 325 (65 FE) MG tablet Take 325 mg by mouth daily with breakfast.   Yes [provider]  gabapentin (NEURONTIN) 300 MG capsule Take 300-600 mg by mouth See admin instructions. Take one capsule (300 mg) by mouth every morning and two capsules (600 mg) at night   Yes [provider]  Homeopathic Products ([place]) LIQD Apply 1 application topically daily.   Yes [provider]  HYDROcodone-acetaminophen (NORCO/VICODIN) 5-325 MG tablet Take 1 tablet by mouth every 4 (four) hours as needed for moderate pain.  11/19/17  Yes [provider]  ibuprofen (ADVIL) 200 MG tablet Take 400 mg by mouth every 6 (six) hours as needed for fever or moderate pain.    Yes [provider]  MAGNESIUM PO Take 1 tablet by mouth daily.   Yes [provider]  MELATONIN PO Take 1 tablet by mouth at bedtime.   Yes [provider]  Multiple Vitamin (MULTIVITAMIN WITH MINERALS) TABS tablet Take 1 tablet by mouth daily.   Yes [provider]  naloxone (NARCAN) 4 MG/0.1ML LIQD nasal spray kit Place 1 spray into the nose once as needed (opioid overdose).   Yes [provider]  naproxen sodium (ALEVE) 220 MG tablet Take  220 mg by mouth 2 (two) times daily as needed (pain).   Yes [provider]  pantoprazole (PROTONIX) 40 MG tablet Take 40 mg by mouth daily.  08/14/17  Yes [provider]  umeclidinium-vilanterol (ANORO ELLIPTA) 62.5-25 MCG/INH AEPB Inhale 1 puff into the lungs daily.   Yes [provider]  guaiFENesin (ROBITUSSIN) 100 MG/5ML SOLN Take 5 mLs (100 mg total) by mouth every 4 (four) hours as needed for cough or to loosen phlegm. Patient not taking: Reported on 11/27/2019 10/17/19   Cristal Ford, DO  senna-docusate (SENOKOT-S) 8.6-50 MG tablet Take 1 tablet by mouth at bedtime as needed for mild constipation. Patient not taking: Reported on 11/27/2019 10/04/16  Mariel Aloe, MD    Allergies    Patient has no known allergies.  Review of Systems   Review of Systems  Constitutional: Positive for fatigue. Negative for chills and fever.  HENT: Negative for ear pain and sore throat.   Eyes: Negative for pain and visual disturbance.  Respiratory: Negative for cough and shortness of breath.   Cardiovascular: Negative for chest pain and palpitations.  Gastrointestinal: Positive for abdominal pain and diarrhea. Negative for blood in stool, constipation and vomiting.  Genitourinary: Negative for dysuria and hematuria.  Musculoskeletal: Negative for arthralgias and back pain.  Skin: Negative for color change and rash.  Neurological: Positive for weakness. Negative for seizures and syncope.  All other systems reviewed and are negative.   Physical Exam Updated Vital Signs  ED Triage Vitals  Enc Vitals Group     BP 11/27/19 1356 (!) 76/48     Pulse Rate 11/27/19 1356 81     Resp 11/27/19 1356 14     Temp 11/27/19 1356 98.3 F (36.8 C)     Temp Source 11/27/19 1356 Oral     SpO2 11/27/19 1356 (!) 76 %     Weight --      Height --      Head Circumference --      Peak Flow --      Pain Score 11/27/19 1407 6     Pain Loc --      Pain Edu? --      Excl. in [place]? --      Physical Exam Vitals and nursing note reviewed.  Constitutional:      General: He is not in acute distress.    Appearance: He is well-developed. He is ill-appearing (chronic, cachetic ).  HENT:     Head: Normocephalic and atraumatic.     Comments: Dry  Mucous membranes     Mouth/Throat:     Mouth: Mucous membranes are moist.  Eyes:     Extraocular Movements: Extraocular movements intact.     Conjunctiva/sclera: Conjunctivae normal.  Cardiovascular:     Rate and Rhythm: Normal rate and regular rhythm.     Heart sounds: Normal heart sounds. No murmur heard.   Pulmonary:     Effort: Pulmonary effort is normal. No respiratory distress.     Breath sounds: Normal breath sounds.  Abdominal:     General: Bowel sounds are normal. There is no distension.     Palpations: Abdomen is soft.     Tenderness: There is generalized abdominal tenderness. There is guarding.  Genitourinary:    Rectum: [place]ai[place] result positive (but grossly [place] stool on exam).  Musculoskeletal:     Cervical back: Neck supple.  Skin:    General: Skin is warm and dry.     Capillary Refill: Capillary refill takes less than 2 seconds.  Neurological:     General: No focal deficit present.     Mental Status: He is alert.  Psychiatric:        Mood and Affect: Mood normal.     ED Results / Procedures / Treatments   Labs (all labs ordered are listed, but only abnormal results are displayed) Labs Reviewed  COMPREHENSIVE METABOLIC PANEL - Abnormal; Notable for the following components:      Result Value   Potassium 3.1 (*)    CO2 20 (*)    Glucose, Bld 107 (*)    Calcium 8.3 (*)    Total Protein 5.4 (*)    Albumin  1.9 (*)    All other components within normal limits  CBC WITH DIFFERENTIAL/PLATELET - Abnormal; Notable for the following components:   WBC 10.8 (*)    RBC 3.60 (*)    Hemoglobin 12.1 (*)    HCT 36.8 (*)    MCV 102.2 (*)    Platelets 495 (*)    Neutro Abs 8.6 (*)    All other components  within normal limits  PROTIME-INR - Abnormal; Notable for the following components:   Prothrombin Time 16.9 (*)    INR 1.4 (*)    All other components within normal limits  APTT - Abnormal; Notable for the following components:   aPTT 39 (*)    All other components within normal limits  POC OCCULT BLOOD, ED - Abnormal; Notable for the following components:   Fecal Occult Bld POSITIVE (*)    All other components within normal limits  C DIFFICILE QUICK SCREEN W PCR REFLEX  CULTURE, BLOOD (SINGLE)  URINE CULTURE  GASTROINTESTINAL PANEL BY PCR, STOOL (REPLACES STOOL CULTURE)  RESPIRATORY PANEL BY RT PCR (FLU A&B, COVID)  LACTIC ACID, PLASMA  MAGNESIUM  LACTIC ACID, PLASMA  URINALYSIS, ROUTINE W REFLEX MICROSCOPIC    EKG EKG Interpretation  Date/Time:  Sunday November 27 2019 14:05:57 EDT Ventricular Rate:  79 PR Interval:  130 QRS Duration: 88 QT Interval:  466 QTC Calculation: 534 R Axis:   44 Text Interpretation: Normal sinus rhythm Prolonged QT Abnormal ECG Confirmed by Lennice Sites [phone]) on 11/27/2019 2:48:28 PM   Radiology CT Angio Chest PE W and/or Wo Contrast  Result Date: 11/27/2019 CLINICAL DATA:  Weakness, abdominal pain, diarrhea. Hypotension and hypoxia. Evaluate for PE. EXAM: CT ANGIOGRAPHY CHEST CT ABDOMEN AND PELVIS WITH CONTRAST TECHNIQUE: Multidetector CT imaging of the chest was performed using the standard protocol during bolus administration of intravenous contrast. Multiplanar CT image reconstructions and MIPs were obtained to evaluate the vascular anatomy. Multidetector CT imaging of the abdomen and pelvis was performed using the standard protocol during bolus administration of intravenous contrast. CONTRAST:  1104m OMNIPAQUE IOHEXOL 350 MG/ML SOLN COMPARISON:  CTA chest dated 10/14/2019.  CT chest dated 07/15/2019. FINDINGS: CTA CHEST FINDINGS Cardiovascular: Satisfactory opacification of the bilateral pulmonary arteries to the segmental level. No evidence  of pulmonary embolism. Study is not tailored for evaluation of the thoracic aorta. No evidence of thoracic aortic aneurysm. Atherosclerotic calcifications of the aortic arch. The heart is top-normal in size.  No pericardial effusion. Coronary atherosclerosis of the LAD and right coronary artery. Mediastinum/Nodes: 16 mm short axis subcarinal node, grossly unchanged, possibly reactive. No suspicious hilar lymphadenopathy. Visualized thyroid is unremarkable. Lungs/Pleura: 18 x 24 mm spiculated nodule in the right upper lobe (series 5/image 30), previously 18 x 20 mm, suspicious for primary bronchogenic neoplasm. Multifocal patchy opacities posterior right upper lobe, right middle lobe, and right lower lobe. This is progressive from the prior, which was previously isolated to the right middle and lower lobes. While multifocal infection/pneumonia is possible, when correlating with multiple CTs, multifocal invasive adenocarcinoma with a lepidic growth pattern could demonstrate this appearance. Superimposed small right pleural effusion, mildly increased. Small left pleural effusion, new/increased. Associated left lower lobe atelectasis. Moderate to severe centrilobular and paraseptal emphysematous changes, upper lung predominant. 11 mm nodule at the left lung apex (series 5/image 14), unchanged, possibly reflecting apical pleural-parenchymal scarring. 6 mm nodule in the left lower lobe (series 5/image 89) and 12 mm nodule in the medial left lower lobe posterior to the aorta (series  5/image 77), previously 4 and 7 mm respectively, suspicious for metastases. No pneumothorax. Musculoskeletal: Mild degenerative changes of the thoracic spine. Review of the MIP images confirms the above findings. CT ABDOMEN and PELVIS FINDINGS Hepatobiliary: 14 mm hypoenhancing lesion in the posterior right hepatic lobe (series 8/image 22), raising concern for metastasis. Distended gallbladder, without associated inflammatory changes. No  intrahepatic or extrahepatic ductal dilatation. Pancreas: Within normal limits. Spleen: Within normal limits. Adrenals/Urinary Tract: Low-density bilateral adrenal nodules, measuring up to 2.6 cm on the left and 2.2 cm on the right (series 8/images 34 and 36), progressive from 07/15/2019 and worrisome for metastatic disease. Kidneys are within normal limits.  No hydronephrosis. Layering 8 mm bladder calculus (series 8/image 85). Stomach/Bowel: Postsurgical changes at the GE junction. Stomach is grossly unremarkable. No evidence of bowel obstruction. Appendix is not discretely visualized. Extensive colonic diverticulosis, without evidence of diverticulitis. Vascular/Lymphatic: No evidence of abdominal aortic aneurysm. Atherosclerotic calcifications of the abdominal aorta and branch vessels. No suspicious abdominopelvic lymphadenopathy. Reproductive: Prostate is unremarkable. Other: No abdominopelvic ascites. Musculoskeletal: Mild degenerative changes of the lumbar spine. Review of the MIP images confirms the above findings. IMPRESSION: No evidence of pulmonary embolism. 24 mm spiculated nodule in the right upper lobe, suspicious for primary bronchogenic neoplasm, mildly progressive. Mildly progressive nodules in the left lower lobe, suspicious for metastases. Multifocal patchy opacities in the right lung, progressive across multiple studies. Given persistence, multifocal invasive adenocarcinoma demonstrating a lepidic growth pattern should be considered, although multifocal pneumonia is still possible. Associated small bilateral pleural effusions, new/progressive. Mildly prominent mediastinal node, grossly unchanged. 14 mm hypoenhancing lesion in the posterior right hepatic lobe, raising concern for metastasis. Bilateral adrenal nodules, progressive from May 2021, raising concern for metastatic disease. Aortic Atherosclerosis (ICD10-I70.0) and Emphysema (ICD10-J43.9). Electronically Signed   By: Julian Hy  M.D.   On: 11/27/2019 17:39   CT ABDOMEN PELVIS W CONTRAST  Result Date: 11/27/2019 CLINICAL DATA:  Weakness, abdominal pain, diarrhea. Hypotension and hypoxia. Evaluate for PE. EXAM: CT ANGIOGRAPHY CHEST CT ABDOMEN AND PELVIS WITH CONTRAST TECHNIQUE: Multidetector CT imaging of the chest was performed using the standard protocol during bolus administration of intravenous contrast. Multiplanar CT image reconstructions and MIPs were obtained to evaluate the vascular anatomy. Multidetector CT imaging of the abdomen and pelvis was performed using the standard protocol during bolus administration of intravenous contrast. CONTRAST:  159mL OMNIPAQUE IOHEXOL 350 MG/ML SOLN COMPARISON:  CTA chest dated 10/14/2019.  CT chest dated 07/15/2019. FINDINGS: CTA CHEST FINDINGS Cardiovascular: Satisfactory opacification of the bilateral pulmonary arteries to the segmental level. No evidence of pulmonary embolism. Study is not tailored for evaluation of the thoracic aorta. No evidence of thoracic aortic aneurysm. Atherosclerotic calcifications of the aortic arch. The heart is top-normal in size.  No pericardial effusion. Coronary atherosclerosis of the LAD and right coronary artery. Mediastinum/Nodes: 16 mm short axis subcarinal node, grossly unchanged, possibly reactive. No suspicious hilar lymphadenopathy. Visualized thyroid is unremarkable. Lungs/Pleura: 18 x 24 mm spiculated nodule in the right upper lobe (series 5/image 30), previously 18 x 20 mm, suspicious for primary bronchogenic neoplasm. Multifocal patchy opacities posterior right upper lobe, right middle lobe, and right lower lobe. This is progressive from the prior, which was previously isolated to the right middle and lower lobes. While multifocal infection/pneumonia is possible, when correlating with multiple CTs, multifocal invasive adenocarcinoma with a lepidic growth pattern could demonstrate this appearance. Superimposed small right pleural effusion, mildly  increased. Small left pleural effusion, new/increased. Associated left lower lobe atelectasis.  Moderate to severe centrilobular and paraseptal emphysematous changes, upper lung predominant. 11 mm nodule at the left lung apex (series 5/image 14), unchanged, possibly reflecting apical pleural-parenchymal scarring. 6 mm nodule in the left lower lobe (series 5/image 89) and 12 mm nodule in the medial left lower lobe posterior to the aorta (series 5/image 77), previously 4 and 7 mm respectively, suspicious for metastases. No pneumothorax. Musculoskeletal: Mild degenerative changes of the thoracic spine. Review of the MIP images confirms the above findings. CT ABDOMEN and PELVIS FINDINGS Hepatobiliary: 14 mm hypoenhancing lesion in the posterior right hepatic lobe (series 8/image 22), raising concern for metastasis. Distended gallbladder, without associated inflammatory changes. No intrahepatic or extrahepatic ductal dilatation. Pancreas: Within normal limits. Spleen: Within normal limits. Adrenals/Urinary Tract: Low-density bilateral adrenal nodules, measuring up to 2.6 cm on the left and 2.2 cm on the right (series 8/images 34 and 36), progressive from 07/15/2019 and worrisome for metastatic disease. Kidneys are within normal limits.  No hydronephrosis. Layering 8 mm bladder calculus (series 8/image 85). Stomach/Bowel: Postsurgical changes at the GE junction. Stomach is grossly unremarkable. No evidence of bowel obstruction. Appendix is not discretely visualized. Extensive colonic diverticulosis, without evidence of diverticulitis. Vascular/Lymphatic: No evidence of abdominal aortic aneurysm. Atherosclerotic calcifications of the abdominal aorta and branch vessels. No suspicious abdominopelvic lymphadenopathy. Reproductive: Prostate is unremarkable. Other: No abdominopelvic ascites. Musculoskeletal: Mild degenerative changes of the lumbar spine. Review of the MIP images confirms the above findings. IMPRESSION: No  evidence of pulmonary embolism. 24 mm spiculated nodule in the right upper lobe, suspicious for primary bronchogenic neoplasm, mildly progressive. Mildly progressive nodules in the left lower lobe, suspicious for metastases. Multifocal patchy opacities in the right lung, progressive across multiple studies. Given persistence, multifocal invasive adenocarcinoma demonstrating a lepidic growth pattern should be considered, although multifocal pneumonia is still possible. Associated small bilateral pleural effusions, new/progressive. Mildly prominent mediastinal node, grossly unchanged. 14 mm hypoenhancing lesion in the posterior right hepatic lobe, raising concern for metastasis. Bilateral adrenal nodules, progressive from May 2021, raising concern for metastatic disease. Aortic Atherosclerosis (ICD10-I70.0) and Emphysema (ICD10-J43.9). Electronically Signed   By: Julian Hy M.D.   On: 11/27/2019 17:39   DG Chest Port 1 View  Result Date: 11/27/2019 CLINICAL DATA:  Questionable sepsis. EXAM: PORTABLE CHEST 1 VIEW COMPARISON:  CT and radiographs from 10/14/2019 FINDINGS: Increased consolidation in the right lower lobe. As before, intermixed lucencies within the area of consolidation. Similar right paratracheal opacity, likely related to spiculated mass seen on prior CT. Stable cardiomediastinal silhouette, partially obscured. Limited evaluation for right pleural effusion. No evidence of a left pleural effusion. No pneumothorax. Clips in the region of the gastroesophageal junction. IMPRESSION: 1. Increased consolidation within the right lower lobe, concerning for pneumonia. Intermixed lucencies within this consolidation, which is nonospecific by radiograph but may represent necrotizing pneumonia given findings on prior chest CT. Chest CT with contrast could further characterize, if clinically indicated. 2. Emphysematous disease. 3. Persistent right medial apical ill-defined opacity, likely correlating to the  spiculated mass seen on prior CT. Electronically Signed   By: Margaretha Sheffield MD   On: 11/27/2019 15:15    Procedures .Critical Care Performed by: Lennice Sites, DO Authorized by: Lennice Sites, DO   Critical care provider statement:    Critical care time (minutes):  45   Critical care was time spent personally by me on the following activities:  Discussions with consultants, evaluation of patient's response to treatment, examination of patient, ordering and performing treatments and interventions, ordering  and review of laboratory studies, ordering and review of radiographic studies, pulse oximetry, re-evaluation of patient's condition, obtaining history from patient or surrogate and review of old charts   (including critical care time)  Medications Ordered in ED Medications  potassium chloride 10 mEq in 100 mL IVPB (10 mEq Intravenous New Bag/Given 11/27/19 1723)  loperamide (IMODIUM) capsule 4 mg (has no administration in time range)  sodium chloride 0.9 % bolus 2,000 mL (0 mLs Intravenous Stopped 11/27/19 1540)  0.9 %  sodium chloride infusion ( Intravenous New Bag/Given 11/27/19 1726)  iohexol (OMNIPAQUE) 350 MG/ML injection 75 mL (100 mLs Intravenous Contrast Given 11/27/19 1636)    ED Course  I have reviewed the triage vital signs and the nursing notes.  Pertinent labs & imaging results that were available during my care of the patient were reviewed by me and considered in my medical decision making (see chart for details).    MDM Rules/Calculators/A&P                          Curtis Gentry is a 75 year old male with history of high cholesterol, recent necrotizing pneumonia/possible bronchogenic mass in the lungs, multiple abdominal surgeries who presents to the ED with abdominal pain, diarrhea, weakness.  Patient hypotensive in the 70s upon arrival.  Has had diarrhea for 2 weeks.  Abdominal pain during the time as well.  Feels very dehydrated.  Patient is in hospice and is  DNR.  Recently admitted for necrotizing pneumonia.  Finished antibiotics he says several weeks ago.  Is on 5 L of oxygen at baseline.  Patient is not pursuing any further work-up for possible cancer in the lungs.  Has some abdominal tenderness on exam.  No temperature on rectal exam.  Vital signs otherwise unremarkable.  After fluid bolus blood pressure has improved.  Patient has grossly [place] stool on exam.  Hemoccult however is positive.  Overall suspect dehydration.  We will get a CT scan of the abdomen and pelvis to evaluate for any surgical process including perforated bowel.  Could be C. difficile or other GI pathogen.  We will get a chest x-ray and sepsis work-up.  We will continue goals of care discussion but he is amenable to IV fluids and antibiotics as needed.  C. difficile is negative.  Lactic acid normal.  INR mildly elevated at 1.4.  Otherwise work-up is unremarkable.  CT scan of chest abdomen pelvis shows likely progression of lung neoplasm.  There is also some multifocal patchy opacities in the right lung which is progressive across previous studies.  Given the persistence likely an invasive adenocarcinoma.  Lower suspicion for pneumonia given he appears to be at his respiratory baseline.  He also has likely a lesion in his liver as well.  Overall really would like to focus on symptomatic care as patient has been multiple bouts of diarrhea while in the ED.  Blood pressure has improved but believe he would benefit from IV fluid hydration, Imodium.  Likely should have palliative care/hospice reengage goals of care.  Overall suspect worsening chronic conditions at this time.  Not quite sure the cause of the diarrhea at this point.  This chart was dictated using voice recognition software.  Despite best efforts to proofread,  errors can occur which can change the documentation meaning.    Final Clinical Impression(s) / ED Diagnoses Final diagnoses:  Diarrhea, unspecified type  Hypokalemia    Hypotension, unspecified hypotension type  Chronic respiratory  failure with hypoxia [facility])    Rx / DC Orders ED Discharge Orders    None       Lennice Sites, DO 11/27/19 [place], [place], DO 11/27/19 1805

## 2019-11-27 NOTE — Consult Note (Signed)
Consultation Note Date: 11/27/2019   Patient Name: Curtis Gentry  DOB: Feb 25, 1945  MRN: [id number]  Age / Sex: 74 y.o., male  PCP: [name], MD Referring Physician: Mckinley Jewel, MD  Reason for Consultation: Establishing goals of care  HPI/Patient Profile: 74 y.o. male  with past medical history of COPD on chronic 5L oxygen, necrotizing pneumonia, hyperlipidemia, dysphagia, lung mass, peptic ulcer disease, peripheral neuropathy, BPH, anxiety, arthritis presented to the ED on 11/27/19 for generalized weakness, abdominal pain, and diarrhea for 2 weeks.  ED Course: Upon arrival to ED: Patient hypotensive, on 6 L of oxygen via nasal cannula, afebrile with leukocytosis of 10.8, H&H: 12.1/36.8, MCV: 102.2, platelet: 495, CMP shows potassium 3.1, CO2: 20, albumin: 1.9, magnesium: 1.9, POC occult blood positive, CT of: Negative, GI panel, COVID-19, BC, UC, UA: Pending.  CT chest, abdomen/pelvis shows 24 mm spiculated nodule in the right upper lobe suspicious for primary bronchogenic neoplasm, mildly progressive.  14 mm hyper enhancing lesion in the posterior right hepatic lobe, raising concern for metastasis.  Bilateral adrenal nodules, progressive from May 2021, raising concern for metastatic disease.  Patient was given IV fluid bolus, KCl and Imodium in ED.  [facility] hospitalist consulted for admission for hypotension and dehydration.  Of note, patient was admitted from 8/20-8/23/21 and discharged with [facility] outpatient Palliative Care and HHPT. ACC had set up an initial meeting for 12/01/19.  Patient and family face treatment option decisions, advanced directive decisions, and anticipatory care needs.  Clinical Assessment and Goals of Care: I have reviewed medical records including EPIC notes, labs, and imaging. Received report from primary RN - no acute concerns.   Went to visit patient at bedside - no  family/visitors present. Patient was lying in bed awake, alert, oriented, and able to participate in conversation. Patient denied pain or shortness of breath. No signs or non-verbal gestures of pain or discomfort noted. No respiratory distress, increased work of breathing, or secretions noted. Patient was on 7L O2 [place].  Met with patient  to discuss diagnosis, prognosis, GOC, EOL wishes, disposition, and options.  I introduced Palliative Medicine as specialized medical care for people living with serious illness. It focuses on providing relief from the symptoms and stress of a serious illness. The goal is to improve quality of life for both the patient and the family.  We discussed a brief life review of the patient as well as functional and nutritional status. Mr. Neumeister is divorced and has three daughters. He enjoys walking in the park, riding around the park, and going to the movies. He also enjoys spending time with his family and explained that he had 6 sisters, sadly two have passed away. He describes himself as a spiritual man and is of [employer]. Prior to this hospitalization, the patient lived at his home with one of his daughters/Lori and her husband. They both moved in with the patient 5-6 months ago to help care for him. Prior to two weeks ago when the patient start experiencing severe diarrhea, he  explains he was able to ambulate with a cane and some days dress and cook for himself. He has a home health aid assist with bathing and other ADL's once a week. Four days ago he noticed a significant decline - he felt extremely weak and was no longer able to ambulate without assistance and also began using a walker. Over the course of the two weeks his appetite also decreased - he explains he did not experience nausea or vomiting but didn't want to eat because "it went right through" him. He experienced frequent and urgent bowel movements - at times he was able to get to the bathroom and other times he  experienced incontinence. Due to his increased need to get to the bathroom, he has not wanted to leave his house - he tells me he misses walking around in the park on warm sunny days. The patient has been receiving home health aid support and HHPT once a week since he was discharged from his last admission on 10/17/19. He also uses chronic 5L O2 [place].  Patient expressed he was not able to ambulate well anymore now that he is using a walker - he cannot pull his oxygen tank and use the walker at the same time - offered to reach out to our social workers to see if there is any equipment that can help with this.   We discussed patient's current illness and what it means in the larger context of patient's on-going co-morbidities. The patient had a clear understanding of his current medical situation including the occult positive finding, dehydration, and suspicious findings on his lung, liver, and adrenals. Natural disease trajectory and expectations at EOL were discussed. I attempted to elicit values and goals of care important to the patient. The difference between aggressive medical intervention and comfort care was considered in light of the patient's goals of care. The patient clearly expressed he did not want invasive procedures such as endoscopy/colonoscopy or work up for metastatic cancer. He was ok with treatment for dehydration and work up for his diarrhea non-invasively at this time. Discussed with the patient his high risk of rehospitalization and asked if it was his wish to continue to be rehospitalized - his wish is to spend as much meaningful time at home with family as he can. His desire is to pass away peacefully and naturally at home. Hospice philosophy and services were explained and offered. At this time, the patient states that he might want to continue HHPT as he was finding it beneficial. Discussed the option to discharge back home with continue outpatient Palliative Care follow up and HHPT then,  in the future instead of returning to the hospital, he could easily transition to hospice/comfort focused care.  Advance directives, concepts specific to code status, artificial feeding and hydration, and rehospitalization were considered and discussed. The patient states that he does have a Living Will and HCPOA. His primary HCPOA is his daughter/Patti Kellie Simmering and secondary HCPOA is his other daughter/Lori Kellie Simmering - asked for copies for our records. Code status was discussed - patient wishes to remain DNR/DNI. Introduced and reviewed MOST form - patient wished to continue conversation about Medical Intervention section (Limited Additional Interventions vs Comfort Care) with his family present. Offered to schedule a family meeting with him and his daughters - he was agreeable and appreciative. At this time, the patient expressed his desire to not be rehospitalized after discharge - but he wants to have this conversation in the family meeting before completing  MOST form. He does not want a feeding tube.   Offered Chaplain services as well as to call and speak with his daughter/HCPOA #1/Patti - he was agreeable and appreciative for both.  After visiting with Mr. Granzow, called Precious Bard and provided updates. She and her siblings had been in contact and she was aware of the patient's current situation. Hospice philosophy and services were discussed at her request - all questions answered. She states the family is supportive of the patient and his wishes. Asked if family could be available for a family meeting tomorrow 10/4 - no time set - asked family to notify nursing staff or to call PMT number when they arrived. Also requested copies of the patient's HCPOA and Living Will - Precious Bard stated she could bring them to the meeting tomorrow.  Discussed with patient/family the importance of continued conversation with each other and the medical providers regarding overall plan of care and treatment options, ensuring decisions  are within the context of the patient's values and GOCs.    Questions and concerns were addressed. The patient and family were encouraged to call with questions or concerns. PMT card was provided to patient/PMT number to family.  Primary Decision Maker: PATIENT    SUMMARY OF RECOMMENDATIONS:  Continue current medical treatment/treat the treatable without invasive tests/procedures  Continue DNR/DNI as previously documented  Patient is clear in his desire not to proceed with work up for metastatic cancer or have GI work up with invasive procedures  Patient is considering outpatient Palliative Care at discharge to continue HHPT vs home hospice  Patient is already [facility] with [facility]   Family meeting scheduled with PMT tomorrow afternoon  Patti Lawson/daughter is HCPOA #1 and Cecille Rubin Sutphin is [facility] #2 per patient  Please obtain copies of HCPOA and Living Will documents if they become available - copies were requested  TOC consult placed for DME needs  Chaplain consulted for spiritual and emotional support per patient request  PMT will continue to follow holistically  Code Status/Advance Care Planning:  DNR  Palliative Prophylaxis:   Aspiration, Bowel Regimen, Delirium Protocol, Frequent Pain Assessment, Oral Care and Turn Reposition  Additional Recommendations (Limitations, Scope, Preferences):  No Chemotherapy, No Radiation, No Surgical Procedures and No Tracheostomy  Psycho-social/Spiritual:   Desire for further Chaplaincy support:no Created space and opportunity for patient and family to express thoughts and feelings regarding patient's current medical situation.   Emotional support provided.  Prognosis:   Unable to determine  Discharge Planning: To Be Determined      Primary Diagnoses: Present on Admission: . BPH (benign prostatic hyperplasia) . Hyperlipidemia . Dehydration . Tobacco abuse . Hypokalemia . Macrocytic anemia . Thrombocytosis .  Lung mass . Anxiety . COPD (chronic obstructive pulmonary disease) ([place]) . Chronic hypoxemic respiratory failure ([place])   I have reviewed the medical record, interviewed the patient and family, and examined the patient. The following aspects are pertinent.  Past Medical History:  Diagnosis Date  . Abnormality of gait 05/03/2015  . Anemia   . ANEMIA-UNSPECIFIED 09/06/2008   Qualifier: Diagnosis of  By: Henrene Pastor MD, Docia Chuck   . Anxiety   . Arthritis   . BPH (benign prostatic hyperplasia) 10/03/2016  . DYSPHAGIA 09/06/2008   Qualifier: Diagnosis of  By: Henrene Pastor MD, Docia Chuck   . Erectile dysfunction   . Hereditary and idiopathic peripheral neuropathy 05/03/2015  . Hyperlipidemia   . Hypokalemia 08/21/2011  . Peptic ulcer disease   . Perforated ulcer ([place])   . SDH (  subdural hematoma) ([place]) 10/03/2016  . Slurred speech 10/03/2016  . 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 Syncope and collapse 08/20/2011  . Tobacco abuse 10/03/2016  . Unspecified disease of spinal cord    myelopahty, cervical spine   Social History   Socioeconomic History  . Marital status: Divorced    Spouse name: Not on file  . Number of children: 3  . Years of education: Not on file  . Highest education level: Not on file  Occupational History  . Occupation: retired  Tobacco Use  . Smoking status: Current Every Day Smoker    Packs/day: 1.00    Years: 50.00    Pack years: 50.00  . Smokeless tobacco: Current User  Vaping Use  . Vaping Use: Never used  Substance and Sexual Activity  . Alcohol use: Not Currently  . Drug use: No  . Sexual activity: Never  Other Topics Concern  . Not on file  Social History Narrative  . Not on file   Social Determinants of Health   Financial Resource Strain:   . Difficulty of Paying Living Expenses: Not on file  Food Insecurity:   . Worried About Charity fundraiser in the Last Year: Not on file  . Ran Out of Food in the Last Year: Not on file  Transportation Needs:     . Lack of Transportation (Medical): Not on file  . Lack of Transportation (Non-Medical): Not on file  Physical Activity:   . Days of Exercise per Week: Not on file  . Minutes of Exercise per Session: Not on file  Stress:   . Feeling of Stress : Not on file  Social Connections:   . Frequency of Communication with Friends and Family: Not on file  . Frequency of Social Gatherings with Friends and Family: Not on file  . Attends Religious Services: Not on file  . Active Member of Clubs or Organizations: Not on file  . Attends Archivist Meetings: Not on file  . Marital Status: Not on file   Family History  Problem Relation Age of Onset  . Stroke Mother   . COPD Father        ?  Marland Kitchen Heart attack Father   . CAD Father   . Diabetes Other        family members  . CAD Other        family members  . Colon cancer Neg Hx   . Neuropathy Neg Hx    Scheduled Meds: . [START ON 11/28/2019] aspirin EC  81 mg Oral Daily  . atorvastatin  10 mg Oral QHS  . [START ON 11/28/2019] ferrous sulfate  325 mg Oral Q breakfast  . [START ON 11/28/2019] gabapentin  300 mg Oral Q breakfast   And  . gabapentin  600 mg Oral QHS  . [START ON 11/28/2019] pantoprazole  40 mg Oral Daily  . [START ON 11/28/2019] umeclidinium-vilanterol  1 puff Inhalation Daily   Continuous Infusions: . 0.9 % NaCl with KCl 20 mEq / L     PRN Meds:.acetaminophen **OR** acetaminophen, albuterol, diazepam, loperamide, ondansetron **OR** ondansetron (ZOFRAN) IV Medications Prior to Admission:  Prior to Admission medications   Medication Sig Start Date End Date Taking? Authorizing Provider  albuterol (PROVENTIL) (2.5 MG/3ML) 0.083% nebulizer solution Take 2.5 mg by nebulization every 6 (six) hours as needed for wheezing or shortness of breath.   Yes [provider]  albuterol (VENTOLIN HFA) 108 (90 Base) MCG/ACT inhaler Inhale 2  puffs into the lungs every 6 (six) hours as needed for wheezing or shortness of breath.    Yes [provider]  aspirin EC 81 MG tablet Take 81 mg by mouth daily.   Yes [provider]  atorvastatin (LIPITOR) 10 MG tablet Take 10 mg by mouth at bedtime.    Yes [provider]  Cholecalciferol (VITAMIN D) 2000 units tablet Take 2,000 Units by mouth daily.   Yes [provider]  diazepam (VALIUM) 5 MG tablet Take 2.5-5 mg by mouth See admin instructions. Take 1/2-1 tablet (2.5-5 mg) every other day as needed for anxiety 04/29/16  Yes [provider]  diclofenac sodium (VOLTAREN) 1 % GEL Apply 2 g 4 (four) times daily topically. Patient taking differently: Apply 2 g topically 4 (four) times daily as needed (pain).  01/08/17  Yes Volanda Napoleon, PA-C  diphenhydramine-acetaminophen (TYLENOL PM) 25-500 MG TABS tablet Take 1 tablet by mouth at bedtime as needed (pain/sleep).   Yes [provider]  ferrous sulfate 325 (65 FE) MG tablet Take 325 mg by mouth daily with breakfast.   Yes [provider]  gabapentin (NEURONTIN) 300 MG capsule Take 300-600 mg by mouth See admin instructions. Take one capsule (300 mg) by mouth every morning and two capsules (600 mg) at night   Yes [provider]  Homeopathic Products ([place]) LIQD Apply 1 application topically daily.   Yes [provider]  HYDROcodone-acetaminophen (NORCO/VICODIN) 5-325 MG tablet Take 1 tablet by mouth every 4 (four) hours as needed for moderate pain.  11/19/17  Yes [provider]  ibuprofen (ADVIL) 200 MG tablet Take 400 mg by mouth every 6 (six) hours as needed for fever or moderate pain.    Yes [provider]  MAGNESIUM PO Take 1 tablet by mouth daily.   Yes [provider]  MELATONIN PO Take 1 tablet by mouth at bedtime.   Yes [provider]  Multiple Vitamin (MULTIVITAMIN WITH MINERALS) TABS tablet Take 1 tablet by mouth daily.   Yes [provider]  naloxone (NARCAN) 4 MG/0.1ML LIQD nasal spray  kit Place 1 spray into the nose once as needed (opioid overdose).   Yes [provider]  naproxen sodium (ALEVE) 220 MG tablet Take 220 mg by mouth 2 (two) times daily as needed (pain).   Yes [provider]  pantoprazole (PROTONIX) 40 MG tablet Take 40 mg by mouth daily.  08/14/17  Yes [provider]  umeclidinium-vilanterol (ANORO ELLIPTA) 62.5-25 MCG/INH AEPB Inhale 1 puff into the lungs daily.   Yes [provider]  guaiFENesin (ROBITUSSIN) 100 MG/5ML SOLN Take 5 mLs (100 mg total) by mouth every 4 (four) hours as needed for cough or to loosen phlegm. Patient not taking: Reported on 11/27/2019 10/17/19   Cristal Ford, DO  senna-docusate (SENOKOT-S) 8.6-50 MG tablet Take 1 tablet by mouth at bedtime as needed for mild constipation. Patient not taking: Reported on 11/27/2019 10/04/16   Mariel Aloe, MD   No Known Allergies Review of Systems  Constitutional: Positive for activity change, appetite change and fatigue.  Respiratory: Negative for shortness of breath.   Gastrointestinal: Positive for diarrhea. Negative for nausea and vomiting.  Neurological: Positive for weakness.  All other systems reviewed and are negative.   Physical Exam Vitals and nursing note reviewed.  Constitutional:      General: He is not in acute distress.    Comments: Frail appearing  Pulmonary:     Effort:  No respiratory distress.  Skin:    General: Skin is warm and dry.  Neurological:     Mental Status: He is alert and oriented to person, place, and time.     Motor: Weakness present.  Psychiatric:        Attention and Perception: Attention normal.        Behavior: Behavior is cooperative.        Cognition and Memory: Cognition and memory normal.     Vital Signs: BP 115/63   Pulse 74   Temp 98.3 F (36.8 C) (Oral)   Resp 20   SpO2 92%  Pain Scale: 0-10   Pain Score: 6    SpO2: SpO2: 92 % O2 Device:SpO2: 92 % O2 Flow Rate: .   IO: Intake/output  summary: No intake or output data in the 24 hours ending 11/27/19 1950  LBM:   Baseline Weight:   Most recent weight:       Palliative Assessment/Data: PPS 40%     Time In: 1845 Time Out: 2000 Time Total: 75 minutes  Greater than 50%  of this time was spent counseling and coordinating care related to the above assessment and plan.  Signed by: Lin Landsman, NP   Please contact Palliative Medicine Team phone at [phone] for questions and concerns.  For individual provider: See Shea Evans

## 2019-11-27 NOTE — ED Notes (Signed)
Diarrhea controlled by imodium, will hold off on rectal tube insertion at this time and continue to reevaluate need for flexiseal.

## 2019-11-28 DIAGNOSIS — Z515 Encounter for palliative care: Secondary | ICD-10-CM | POA: Insufficient documentation

## 2019-11-28 DIAGNOSIS — J449 Chronic obstructive pulmonary disease, unspecified: Secondary | ICD-10-CM

## 2019-11-28 DIAGNOSIS — I959 Hypotension, unspecified: Secondary | ICD-10-CM | POA: Insufficient documentation

## 2019-11-28 DIAGNOSIS — E860 Dehydration: Principal | ICD-10-CM

## 2019-11-28 DIAGNOSIS — Z789 Other specified health status: Secondary | ICD-10-CM | POA: Insufficient documentation

## 2019-11-28 DIAGNOSIS — R918 Other nonspecific abnormal finding of lung field: Secondary | ICD-10-CM

## 2019-11-28 DIAGNOSIS — Z66 Do not resuscitate: Secondary | ICD-10-CM | POA: Insufficient documentation

## 2019-11-28 DIAGNOSIS — F419 Anxiety disorder, unspecified: Secondary | ICD-10-CM

## 2019-11-28 DIAGNOSIS — Z7189 Other specified counseling: Secondary | ICD-10-CM | POA: Insufficient documentation

## 2019-11-28 DIAGNOSIS — R197 Diarrhea, unspecified: Secondary | ICD-10-CM | POA: Insufficient documentation

## 2019-11-28 DIAGNOSIS — R531 Weakness: Secondary | ICD-10-CM | POA: Insufficient documentation

## 2019-11-28 LAB — URINE CULTURE

## 2019-11-28 LAB — BASIC METABOLIC PANEL
Anion gap: 8 (ref 5–15)
BUN: 14 mg/dL (ref 8–23)
CO2: 19 mmol/L — ABNORMAL LOW (ref 22–32)
Calcium: 7.3 mg/dL — ABNORMAL LOW (ref 8.9–10.3)
Chloride: 114 mmol/L — ABNORMAL HIGH (ref 98–111)
Creatinine, Ser: 0.77 mg/dL (ref 0.61–1.24)
GFR calc Af Amer: >60 mL/min (ref >60)
GFR calc non Af Amer: >60 mL/min (ref >60)
Glucose, Bld: 77 mg/dL (ref 70–99)
Potassium: 3.4 mmol/L — ABNORMAL LOW (ref 3.5–5.1)
Sodium: 141 mmol/L (ref 135–145)

## 2019-11-28 LAB — URINALYSIS, ROUTINE W REFLEX MICROSCOPIC
Bilirubin Urine: NEGATIVE
Glucose, UA: NEGATIVE mg/dL
Hgb urine dipstick: NEGATIVE
Ketones, ur: NEGATIVE mg/dL
Leukocytes,Ua: NEGATIVE
Nitrite: NEGATIVE
Protein, ur: NEGATIVE mg/dL
Specific Gravity, Urine: 1.044 — ABNORMAL HIGH (ref 1.005–1.030)
pH: 5 (ref 5.0–8.0)

## 2019-11-28 LAB — PROTIME-INR
INR: 1.4 — ABNORMAL HIGH (ref 0.8–1.2)
Prothrombin Time: 16.4 seconds — ABNORMAL HIGH (ref 11.4–15.2)

## 2019-11-28 LAB — CBC
HCT: 30.8 % — ABNORMAL LOW (ref 39.0–52.0)
Hemoglobin: 10.2 g/dL — ABNORMAL LOW (ref 13.0–17.0)
MCH: 35.1 pg — ABNORMAL HIGH (ref 26.0–34.0)
MCHC: 33.1 g/dL (ref 30.0–36.0)
MCV: 105.8 fL — ABNORMAL HIGH (ref 80.0–100.0)
Platelets: 476 10*3/uL — ABNORMAL HIGH (ref 150–400)
RBC: 2.91 MIL/uL — ABNORMAL LOW (ref 4.22–5.81)
RDW: 14.7 % (ref 11.5–15.5)
WBC: 7.9 10*3/uL (ref 4.0–10.5)
nRBC: 0 % (ref 0.0–0.2)

## 2019-11-28 MED ORDER — ZINC OXIDE 40 % EX OINT
TOPICAL_OINTMENT | Freq: Two times a day (BID) | CUTANEOUS | Status: DC
  Filled 2019-11-28 (×2): qty 57

## 2019-11-28 MED ORDER — NICOTINE 21 MG/24HR TD PT24
21.0000 mg | MEDICATED_PATCH | Freq: Every day | TRANSDERMAL | Status: DC
  Administered 2019-11-28 – 2019-11-29 (×2): 21 mg via TRANSDERMAL
  Filled 2019-11-28 (×2): qty 1

## 2019-11-28 NOTE — Progress Notes (Signed)
PROGRESS NOTE    Curtis Gentry  DXA:[id number] DOB: 04/28/1945 DOA: 11/27/2019 PCP: [name], MD   Brief Narrative:  Curtis Gentry is a 74 y.o. male with medical history significant of chronic hypoxemic respiratory failure-on 5 L of oxygen via nasal cannula secondary to underlying COPD, tobacco abuse, hyperlipidemia, protein calorie malnutrition, lung mass, GERD presents to emergency department with generalized weakness and diarrhea since 5 days. Patient tells me that he has loose stools since 5 days which is watery, nonbloody, dark [place] in color, associated with mucus, foul-smelling, too many episodes to count associated with generalized weakness, no energy.  He denies vomiting, abdominal pain, nausea, fever, chills, urinary symptoms.  No recent sick contact.  He lives with his daughter and son-in-law at home.  Due to worsening loose stool and generalized weakness family called EMS.  Patient's blood pressure noted to be 80 systolic, IVF bolus given in route to ED. He has chronic cough & shortness of breath however denies worsening respiratory symptoms, denies chest pain, wheezing, leg swelling, orthopnea, PND,  headache, blurry vision, syncope. He is on 5 L of oxygen at home all the time, continues to smoke 1 pack of cigarettes per day, denies alcohol, illicit drug use. He is DNR/DNI.  Refused further work-up for possible metastatic lung cancer in the past. Upon arrival to ED: Patient hypotensive, on 6 L of oxygen via nasal cannula, afebrile with leukocytosis of 10.8, H&H: 12.1/36.8, MCV: 102.2, platelet: 495, CMP shows potassium 3.1, CO2: 20, albumin: 1.9, magnesium: 1.9, POC occult blood positive, CT of: Negative, GI panel, COVID-19, BC, UC, UA: Pending.  CT chest, abdomen/pelvis shows 24 mm spiculated nodule in the right upper lobe suspicious for primary bronchogenic neoplasm, mildly progressive.  14 mm hyper enhancing lesion in the posterior right hepatic lobe, raising concern for metastasis.   Bilateral adrenal nodules, progressive from May 2021, raising concern for metastatic disease.  Patient was given IV fluid bolus, KCl and Imodium in ED.  [facility] hospital[facility] consulted for admission for hypotension and dehydration.   Assessment & Plan:   Principal Problem:   Dehydration Active Problems:   Macrocytic anemia   Hypokalemia   Hyperlipidemia   Anxiety   Tobacco abuse   BPH (benign prostatic hyperplasia)   Lung mass   Thrombocytosis   COPD (chronic obstructive pulmonary disease) (HCC)   Chronic hypoxemic respiratory failure (HCC)   Advanced care planning, goals of care  -Patient evaluated and discussed MOST form with palliative care today -Patient remains DNR, DNI, is not requesting any further invasive procedures or evaluation -Patient continues to refuse any further work-up or evaluation of lung mass with likely metastatic disease to the liver -We discussed that our goals at this time would be to keep the patient comfortable, his family was at bedside for this conversation and agrees. -Goals of care this point will focus on keeping patient comfortable, early discharge home with limited return to the hospital given his goals are to stay at home with family and wishes only to return to the hospital if absolutely urgent or necessary. -We appreciate palliative care's assistance with this complex case.  Profound hypotension and dehydration secondary to profuse diarrhea - C. difficile negative, Covid negative, likely viral gastroenteritis, improving with supportive care - Continue IV fluids.  Zofran as needed for nausea and vomiting and Imodium as needed for loose stool. 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  Likely chronic iron deficiency anemia of with concurrent chronic anemia of chronic disease given above  -Continue to monitor. -Continue ferrous sulfate  Thrombocytosis: Platelet: 495 -Likely reactive, given above.  Hypokalemia: Replenished.  Magnesium level: WNL.  Repeat BMP tomorrow a.m.  Hyperlipidemia: Continue statin  Tobacco abuse: Counseled about cessation especially given concern for metastatic disease and lung lesion  Anxiety: Continue Valium as needed  Prolonged QT interval: Reviewed EKG.  Replenish potassium.  Monitor closely on telemetry.  Severe protein calorie malnutrition: -Albumin: 1.9.  Consult dietitian  DVT prophylaxis: SCD Code Status: DNR/DNI Family Communication: At bedside  Status is: Inpatient  Dispo: The patient is from: Home              Anticipated d/c is to: Likely home              Anticipated d/c date is: 24 to 48 hours              Patient currently not medically stable for discharge given need for ongoing IV fluids and close monitoring  Consultants:   Palliative care  Procedures:   None planned  Antimicrobials:  None indicated  Subjective: No acute issues or events overnight, diarrhea improving, patient feels remarkably improved since admission but not yet back to baseline.  Denies nausea vomiting constipation headache fevers or chills.  Objective: Vitals:   11/28/19 0400 11/28/19 0445 11/28/19 0530 11/28/19 0630  BP: (!) 100/57 111/63 107/68 109/63  Pulse: 77 81 79 78  Resp: 19 (!) 34 19 17  Temp:      TempSrc:      SpO2: (!) 87% (!) 88% (!) 89% (!) 85%   No intake or output data in the 24 hours  ending 11/28/19 0812 There were no vitals filed for this visit.  Examination:  General: Cachectic appearing gentleman resting comfortably in bed, No acute distress. HEENT:  Normocephalic atraumatic.  Sclerae nonicteric, noninjected.  Extraocular movements intact bilaterally. Neck:  Without mass or deformity.  Trachea is midline. Lungs:  Clear to auscultate bilaterally without rhonchi, wheeze, or rales. Heart:  Regular rate and rhythm.  Without murmurs, rubs, or gallops. Abdomen:  Soft, nontender, nondistended.  Without guarding or rebound. Extremities: Without cyanosis, clubbing, edema, or obvious deformity. Vascular:  Dorsalis pedis and posterior tibial pulses palpable bilaterally. Skin:  Warm and dry, no erythema, no ulcerations.   Data Reviewed: I have personally reviewed following labs and imaging studies  CBC: Recent Labs  Lab 11/27/19 1435 11/28/19 0401  WBC 10.8* 7.9  NEUTROABS 8.6*  --   HGB 12.1* 10.2*  HCT 36.8* 30.8*  MCV 102.2* 105.8*  PLT 495* 326*   Basic Metabolic Panel: Recent Labs  Lab 11/27/19 1435  NA 139  K 3.1*  CL 107  CO2 20*  GLUCOSE 107*  BUN 19  CREATININE 0.95  CALCIUM 8.3*  MG 1.9   GFR: CrCl cannot be calculated (Unknown ideal weight.). Liver Function Tests: Recent Labs  Lab 11/27/19 1435  AST 23  ALT 16  ALKPHOS 112  BILITOT 0.6  PROT 5.4*  ALBUMIN 1.9*   No results for input(s): LIPASE, AMYLASE in the last 168 hours. No results for input(s): AMMONIA in the last 168 hours. Coagulation Profile: Recent Labs  Lab 11/27/19 1501 11/28/19 0401  INR 1.4* 1.4*   Cardiac Enzymes: No results for input(s): CKTOTAL, CKMB, CKMBINDEX, TROPONINI in the last 168 hours. BNP (last 3 results) No results for input(s): PROBNP in the last 8760 hours. HbA1C: No results for input(s): HGBA1C in the last 72 hours. CBG: No results for input(s): GLUCAP in the last 168 hours. Lipid Profile: No results for input(s): CHOL, HDL, LDLCALC, TRIG,  CHOLHDL, LDLDIRECT in the last 72 hours. Thyroid Function Tests: No results for input(s): TSH, T4TOTAL, FREET4, T3FREE, THYROIDAB in the last 72 hours. Anemia Panel: No results for input(s): VITAMINB12, FOLATE, FERRITIN, TIBC, IRON, RETICCTPCT in the last 72 hours. Sepsis Labs: Recent Labs  Lab 11/27/19 1501  LATICACIDVEN 1.9    Recent Results (from the past 240 hour(s))  Blood culture (routine single)     Status: None (Preliminary result)   Collection Time: 11/27/19  3:43 PM   Specimen: BLOOD  Result Value Ref Range Status   Specimen Description BLOOD RIGHT ANTECUBITAL  Final   Special Requests   Final    BOTTLES DRAWN AEROBIC ONLY Blood Culture results may not be optimal due to an inadequate volume of blood received in culture bottles Performed at [facility] [address] [place], [place] [address]    Culture PENDING  Incomplete   Report Status PENDING  Incomplete  C Difficile Quick Screen w PCR reflex     Status: None   Collection Time: 11/27/19  4:00 PM   Specimen: Stool  Result Value Ref Range Status   C Diff antigen NEGATIVE NEGATIVE Final   C Diff toxin NEGATIVE NEGATIVE Final   C Diff interpretation No C. difficile detected.  Final    Comment: Performed at [facility], [place] [address] [place], [place] [address]  Respiratory Panel by RT PCR (Flu A&B, Covid) - Nasopharyngeal Swab     Status: None   Collection Time: 11/27/19  7:25 PM   Specimen: Nasopharyngeal Swab  Result Value Ref Range Status   SARS Coronavirus 2 by RT PCR NEGATIVE NEGATIVE Final    Comment: (NOTE) SARS-CoV-2 target nucleic acids are NOT DETECTED.  The SARS-CoV-2 RNA is generally detectable in upper respiratoy specimens during the acute phase of infection. The lowest concentration of SARS-CoV-2 viral copies this assay can detect is 131 copies/mL. A negative result does not preclude SARS-Cov-2 infection and should not be used as the sole basis for treatment or other patient  management decisions. A negative result may occur with  improper specimen collection/handling, submission of specimen other than nasopharyngeal swab, presence of viral mutation(s) within the areas targeted by this assay, and inadequate number of viral copies (<131 copies/mL). A negative result must be combined with clinical observations, patient history, and epidemiological information. The expected result is Negative.  Fact Sheet for Patients:  PinkCheek.be  Fact Sheet for Healthcare Providers:  GravelBags.it  This test is no t yet approved or cleared by the [place] FDA and  has been authorized for detection and/or diagnosis of SARS-CoV-2 by FDA under an Emergency Use Authorization (EUA). This EUA will remain  in effect (meaning this test can be used) for the duration of the COVID-19 declaration under Section 564(b)(1) of the Act, 21 U.S.C. section 360bbb-3(b)(1), unless the authorization is terminated or revoked  sooner.     Influenza A by PCR NEGATIVE NEGATIVE Final   Influenza B by PCR NEGATIVE NEGATIVE Final    Comment: (NOTE) The Xpert Xpress SARS-CoV-2/FLU/RSV assay is intended as an aid in  the diagnosis of influenza from Nasopharyngeal swab specimens and  should not be used as a sole basis for treatment. Nasal washings and  aspirates are unacceptable for Xpert Xpress SARS-CoV-2/FLU/RSV  testing.  Fact Sheet for Patients: PinkCheek.be  Fact Sheet for Healthcare Providers: GravelBags.it  This test is not yet approved or cleared by the [place] FDA and  has been authorized for detection and/or diagnosis of SARS-CoV-2 by  FDA under an Emergency Use Authorization (EUA). This EUA will remain  in effect (meaning this test can be used) for the duration of the  Covid-19 declaration under Section 564(b)(1) of the Act, 21  U.S.C. section 360bbb-3(b)(1),  unless the authorization is  terminated or revoked. Performed at [facility], [place] [address], [place], [place] [address]          Radiology Studies: CT Angio Chest PE W and/or Wo Contrast  Result Date: 11/27/2019 CLINICAL DATA:  Weakness, abdominal pain, diarrhea. Hypotension and hypoxia. Evaluate for PE. EXAM: CT ANGIOGRAPHY CHEST CT ABDOMEN AND PELVIS WITH CONTRAST TECHNIQUE: Multidetector CT imaging of the chest was performed using the standard protocol during bolus administration of intravenous contrast. Multiplanar CT image reconstructions and MIPs were obtained to evaluate the vascular anatomy. Multidetector CT imaging of the abdomen and pelvis was performed using the standard protocol during bolus administration of intravenous contrast. CONTRAST:  154mL OMNIPAQUE IOHEXOL 350 MG/ML SOLN COMPARISON:  CTA chest dated 10/14/2019.  CT chest dated 07/15/2019. FINDINGS: CTA CHEST FINDINGS Cardiovascular: Satisfactory opacification of the bilateral pulmonary arteries to the segmental level. No evidence of pulmonary embolism. Study is not tailored for evaluation of the thoracic aorta. No evidence of thoracic aortic aneurysm. Atherosclerotic calcifications of the aortic arch. The heart is top-normal in size.  No pericardial effusion. Coronary atherosclerosis of the LAD and right coronary artery. Mediastinum/Nodes: 16 mm short axis subcarinal node, grossly unchanged, possibly reactive. No suspicious hilar lymphadenopathy. Visualized thyroid is unremarkable. Lungs/Pleura: 18 x 24 mm spiculated nodule in the right upper lobe (series 5/image 30), previously 18 x 20 mm, suspicious for primary bronchogenic neoplasm. Multifocal patchy opacities posterior right upper lobe, right middle lobe, and right lower lobe. This is progressive from the prior, which was previously isolated to the right middle and lower lobes. While multifocal infection/pneumonia is possible, when correlating with multiple CTs,  multifocal invasive adenocarcinoma with a lepidic growth pattern could demonstrate this appearance. Superimposed small right pleural effusion, mildly increased. Small left pleural effusion, new/increased. Associated left lower lobe atelectasis. Moderate to severe centrilobular and paraseptal emphysematous changes, upper lung predominant. 11 mm nodule at the left lung apex (series 5/image 14), unchanged, possibly reflecting apical pleural-parenchymal scarring. 6 mm nodule in the left lower lobe (series 5/image 89) and 12 mm nodule in the medial left lower lobe posterior to the aorta (series 5/image 77), previously 4 and 7 mm respectively, suspicious for metastases. No pneumothorax. Musculoskeletal: Mild degenerative changes of the thoracic spine. Review of the MIP images confirms the above findings. CT ABDOMEN and PELVIS FINDINGS Hepatobiliary: 14 mm hypoenhancing lesion in the posterior right hepatic lobe (series 8/image 22), raising concern for metastasis. Distended gallbladder, without associated inflammatory changes. No intrahepatic or extrahepatic ductal dilatation. Pancreas: Within normal limits. Spleen: Within normal limits. Adrenals/Urinary Tract: Low-density bilateral adrenal nodules, measuring  up to 2.6 cm on the left and 2.2 cm on the right (series 8/images 34 and 36), progressive from 07/15/2019 and worrisome for metastatic disease. Kidneys are within normal limits.  No hydronephrosis. Layering 8 mm bladder calculus (series 8/image 85). Stomach/Bowel: Postsurgical changes at the GE junction. Stomach is grossly unremarkable. No evidence of bowel obstruction. Appendix is not discretely visualized. Extensive colonic diverticulosis, without evidence of diverticulitis. Vascular/Lymphatic: No evidence of abdominal aortic aneurysm. Atherosclerotic calcifications of the abdominal aorta and branch vessels. No suspicious abdominopelvic lymphadenopathy. Reproductive: Prostate is unremarkable. Other: No  abdominopelvic ascites. Musculoskeletal: Mild degenerative changes of the lumbar spine. Review of the MIP images confirms the above findings. IMPRESSION: No evidence of pulmonary embolism. 24 mm spiculated nodule in the right upper lobe, suspicious for primary bronchogenic neoplasm, mildly progressive. Mildly progressive nodules in the left lower lobe, suspicious for metastases. Multifocal patchy opacities in the right lung, progressive across multiple studies. Given persistence, multifocal invasive adenocarcinoma demonstrating a lepidic growth pattern should be considered, although multifocal pneumonia is still possible. Associated small bilateral pleural effusions, new/progressive. Mildly prominent mediastinal node, grossly unchanged. 14 mm hypoenhancing lesion in the posterior right hepatic lobe, raising concern for metastasis. Bilateral adrenal nodules, progressive from May 2021, raising concern for metastatic disease. Aortic Atherosclerosis (ICD10-I70.0) and Emphysema (ICD10-J43.9). Electronically Signed   By: Julian Hy M.D.   On: 11/27/2019 17:39   CT ABDOMEN PELVIS W CONTRAST  Result Date: 11/27/2019 CLINICAL DATA:  Weakness, abdominal pain, diarrhea. Hypotension and hypoxia. Evaluate for PE. EXAM: CT ANGIOGRAPHY CHEST CT ABDOMEN AND PELVIS WITH CONTRAST TECHNIQUE: Multidetector CT imaging of the chest was performed using the standard protocol during bolus administration of intravenous contrast. Multiplanar CT image reconstructions and MIPs were obtained to evaluate the vascular anatomy. Multidetector CT imaging of the abdomen and pelvis was performed using the standard protocol during bolus administration of intravenous contrast. CONTRAST:  167mL OMNIPAQUE IOHEXOL 350 MG/ML SOLN COMPARISON:  CTA chest dated 10/14/2019.  CT chest dated 07/15/2019. FINDINGS: CTA CHEST FINDINGS Cardiovascular: Satisfactory opacification of the bilateral pulmonary arteries to the segmental level. No evidence of  pulmonary embolism. Study is not tailored for evaluation of the thoracic aorta. No evidence of thoracic aortic aneurysm. Atherosclerotic calcifications of the aortic arch. The heart is top-normal in size.  No pericardial effusion. Coronary atherosclerosis of the LAD and right coronary artery. Mediastinum/Nodes: 16 mm short axis subcarinal node, grossly unchanged, possibly reactive. No suspicious hilar lymphadenopathy. Visualized thyroid is unremarkable. Lungs/Pleura: 18 x 24 mm spiculated nodule in the right upper lobe (series 5/image 30), previously 18 x 20 mm, suspicious for primary bronchogenic neoplasm. Multifocal patchy opacities posterior right upper lobe, right middle lobe, and right lower lobe. This is progressive from the prior, which was previously isolated to the right middle and lower lobes. While multifocal infection/pneumonia is possible, when correlating with multiple CTs, multifocal invasive adenocarcinoma with a lepidic growth pattern could demonstrate this appearance. Superimposed small right pleural effusion, mildly increased. Small left pleural effusion, new/increased. Associated left lower lobe atelectasis. Moderate to severe centrilobular and paraseptal emphysematous changes, upper lung predominant. 11 mm nodule at the left lung apex (series 5/image 14), unchanged, possibly reflecting apical pleural-parenchymal scarring. 6 mm nodule in the left lower lobe (series 5/image 89) and 12 mm nodule in the medial left lower lobe posterior to the aorta (series 5/image 77), previously 4 and 7 mm respectively, suspicious for metastases. No pneumothorax. Musculoskeletal: Mild degenerative changes of the thoracic spine. Review of the MIP images confirms  the above findings. CT ABDOMEN and PELVIS FINDINGS Hepatobiliary: 14 mm hypoenhancing lesion in the posterior right hepatic lobe (series 8/image 22), raising concern for metastasis. Distended gallbladder, without associated inflammatory changes. No  intrahepatic or extrahepatic ductal dilatation. Pancreas: Within normal limits. Spleen: Within normal limits. Adrenals/Urinary Tract: Low-density bilateral adrenal nodules, measuring up to 2.6 cm on the left and 2.2 cm on the right (series 8/images 34 and 36), progressive from 07/15/2019 and worrisome for metastatic disease. Kidneys are within normal limits.  No hydronephrosis. Layering 8 mm bladder calculus (series 8/image 85). Stomach/Bowel: Postsurgical changes at the GE junction. Stomach is grossly unremarkable. No evidence of bowel obstruction. Appendix is not discretely visualized. Extensive colonic diverticulosis, without evidence of diverticulitis. Vascular/Lymphatic: No evidence of abdominal aortic aneurysm. Atherosclerotic calcifications of the abdominal aorta and branch vessels. No suspicious abdominopelvic lymphadenopathy. Reproductive: Prostate is unremarkable. Other: No abdominopelvic ascites. Musculoskeletal: Mild degenerative changes of the lumbar spine. Review of the MIP images confirms the above findings. IMPRESSION: No evidence of pulmonary embolism. 24 mm spiculated nodule in the right upper lobe, suspicious for primary bronchogenic neoplasm, mildly progressive. Mildly progressive nodules in the left lower lobe, suspicious for metastases. Multifocal patchy opacities in the right lung, progressive across multiple studies. Given persistence, multifocal invasive adenocarcinoma demonstrating a lepidic growth pattern should be considered, although multifocal pneumonia is still possible. Associated small bilateral pleural effusions, new/progressive. Mildly prominent mediastinal node, grossly unchanged. 14 mm hypoenhancing lesion in the posterior right hepatic lobe, raising concern for metastasis. Bilateral adrenal nodules, progressive from May 2021, raising concern for metastatic disease. Aortic Atherosclerosis (ICD10-I70.0) and Emphysema (ICD10-J43.9). Electronically Signed   By: Julian Hy  M.D.   On: 11/27/2019 17:39   DG Chest Port 1 View  Result Date: 11/27/2019 CLINICAL DATA:  Questionable sepsis. EXAM: PORTABLE CHEST 1 VIEW COMPARISON:  CT and radiographs from 10/14/2019 FINDINGS: Increased consolidation in the right lower lobe. As before, intermixed lucencies within the area of consolidation. Similar right paratracheal opacity, likely related to spiculated mass seen on prior CT. Stable cardiomediastinal silhouette, partially obscured. Limited evaluation for right pleural effusion. No evidence of a left pleural effusion. No pneumothorax. Clips in the region of the gastroesophageal junction. IMPRESSION: 1. Increased consolidation within the right lower lobe, concerning for pneumonia. Intermixed lucencies within this consolidation, which is nonospecific by radiograph but may represent necrotizing pneumonia given findings on prior chest CT. Chest CT with contrast could further characterize, if clinically indicated. 2. Emphysematous disease. 3. Persistent right medial apical ill-defined opacity, likely correlating to the spiculated mass seen on prior CT. Electronically Signed   By: Margaretha Sheffield MD   On: 11/27/2019 15:15        Scheduled Meds: . aspirin EC  81 mg Oral Daily  . atorvastatin  10 mg Oral QHS  . ferrous sulfate  325 mg Oral Q breakfast  . gabapentin  300 mg Oral Q breakfast   And  . gabapentin  600 mg Oral QHS  . pantoprazole  40 mg Oral Daily  . umeclidinium-vilanterol  1 puff Inhalation Daily   Continuous Infusions: . 0.9 % NaCl with KCl 20 mEq / L 125 mL/hr at 11/28/19 0636     LOS: 1 day   Time spent: 40min  Captola Teschner C Nayeli Calvert, DO [facility]  If 7PM-7AM, please contact night-coverage [email or web address]  11/28/2019, 8:12 AM

## 2019-11-28 NOTE — ED Notes (Signed)
Patient's O2 sats dip in the low 80's and high 70's while sleeping because of mouth breathing. Physician notified.

## 2019-11-28 NOTE — ED Notes (Signed)
Additional bag of NS w/KCl arrived from pharmacy; at bedside when needed

## 2019-11-28 NOTE — ED Notes (Signed)
Pt had episode of diarrhea

## 2019-11-28 NOTE — ED Notes (Signed)
Pt SpO2 dropped too 78%. Pt placed on NRB to encourage increase of O2. Pt SOB, tachypnea, labored.   Sp02 91%. MD called and informed. This RN was advised as long as pt is not symptomatic, Sp02 in mid 80s acceptable

## 2019-11-28 NOTE — ED Notes (Signed)
While rounding on pt it was noted that pts IV was out (20 RT FA). Pump was paused. IV site assessed,no infiltration, no swelling or redness to site. IV Catheter was intact. This RN assessed the other IV site (22 LF Wrist) and IV would not flush and pt voiced discomfort. IV removed, catheter intact, no infiltration, swelling or redness to said area. Pt was changed, condom cath placed, Pt resting, vital signs stable.

## 2019-11-28 NOTE — ED Notes (Signed)
Avon Gully, MD notified of ptoxygen saturation staying in the low 70's while on 6L [place]. This RN was advised to place pt on salter nasal cannula. Pt is now on 15L salter [place]

## 2019-11-28 NOTE — Progress Notes (Signed)
Pt seen in ER as he is awaiting progressive care bed. Reviewed chart.  Pt is resting comfortably at this time with no complaints. Denies any pain.  Is hemodynamically stable.  Has been evaluated by palliative care and plan is to discharge with hospice.  No concerns by RN staff at this time Continue current care while awaiting for transfer to progressive care unit.

## 2019-11-28 NOTE — ED Notes (Signed)
Patient has had several loose stools in a row. Imodium given.

## 2019-11-28 NOTE — ED Notes (Signed)
Pt slid up in bed, sitting up and snacking on breakfast.

## 2019-11-28 NOTE — Progress Notes (Signed)
°   11/28/19 1014  Clinical Encounter Type  Visited With Patient  Visit Type Initial;Spiritual support  Referral From Palliative care team  Consult/Referral To Chaplain  Stress Factors  Patient Stress Factors Other (Comment) (Equipment to transition home)  This chaplain responded to PMT consult for spiritual care.  The Pt. is awake when the chaplain entered and accepted a visit. The chaplain understands the Pt. is free from pain and the diarrhea has stopped.  The Pt. shares he is looking for a nap in the near future and anticipates time with his family today.  The Pt. talks freely about his goal of regaining the use of his walker while balancing his oxygen needs.  The Pt. statement fits into the quality of life he desires at home without invasive procedures. The Pt. describes himself as an "outdoors man."  The chaplain understands the Pt. [employer] faith and his family allows him to not be afraid of death.  The Pt. explains to the chaplain conversations with his daughters have occurred and will continue to guide his choices for healthcare.  The chaplain heard the Pt invitation for F/U spiritual care and accepted the Pt. request to pray after the family meeting.

## 2019-11-28 NOTE — Progress Notes (Signed)
Daily Progress Note   Patient Name: Curtis Gentry       Date: 11/28/2019 DOB: December 12, 1945  Age: 74 y.o. MRN#: [id number] Attending Physician: Little Ishikawa, MD Primary Care Physician: [name], MD Admit Date: 11/27/2019  Reason for Consultation/Follow-up: To discuss complex medical decision making related to patient's goals of care  Subjective: Met with patient at bedside in ER.  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  He does not want work up for the tumors, no surgery, no chemo.   He loves to be outside in the park.  Wants to continue to smoke despite is 5L/hour oxygen requirement.   Desires only to return to the hospital if he has something than can be fixed.    MOST form partially completed with Curtis Gentry last evening reflects:  DNR, Antibiotics to be determined at time of the infection, IVF for a trial period, no feeding tube.    I met with family in a consult room as Curtis Gentry was being cleaned up.  Family is also of the same mindset.  They feel additional help in the home would be very beneficial.  We discussed Hospice services in the home.  They are supposed to meet with [facility] at some point soon.   However, after discussing Hospice they feel that it may be a better option.    Recently Curtis Gentry has declined from walking with a cane to walking with a walker - and he has become more sedentary.  Called [facility] who is readily available to meet with family and answer any remaining questions regarding Hospice services at 1:00 today.   Assessment: Very pleasant man - struggling with diarrhea, decreased mobility and 5-6L oxygen requirement.   Patient Profile/HPI:  74 y.o. male  with past medical history of  COPD on chronic 5L oxygen, necrotizing pneumonia, hyperlipidemia, dysphagia, lung mass, peptic ulcer disease, peripheral neuropathy, BPH, anxiety, arthritis presented to the ED on 11/27/19 for generalized weakness, abdominal pain, and diarrhea for 2 weeks.    Length of Stay: 1   Vital Signs: BP (!) 102/56 (BP Location: Right Arm)   Pulse 84   Temp 98.3 F (36.8 C) (Oral)   Resp (!) 23   SpO2 90%  SpO2: SpO2: 90 %  O2 Device: O2 Device: NRB O2 Flow Rate: O2 Flow Rate (L/min): 15 L/min       Palliative Assessment/Data:  20%     Palliative Care Plan    Recommendations/Plan:  Hospice to meet with family today at 1:00 pm.  Anticipate they will want to attempt to optimize him inpatient (at least slow the diarrhea) then DC home with hospice.  If after 2-3 days of hospital treatment patient is not improved - consider discharge to home or hospice house anyway.  Code Status:  DNR  Prognosis:   < 6 months secondary to metastatic cancer that patient is choosing not to work up or treat.  5-6L oxygen requirement.  Progressive debility.   Discharge Planning:  Home with Hospice when medically optimized.  Care plan was discussed with patient, family, Dr. Avon Gully, [facility].  Thank you for allowing the Palliative Medicine Team to assist in the care of this patient.  Total time spent:  70 min.     Greater than 50%  of this time was spent counseling and coordinating care related to the above assessment and plan.  Florentina Jenny, PA-C Palliative Medicine  Please contact Palliative MedicineTeam phone at [phone] for questions and concerns between 7 am - 7 pm.   Please see AMION for individual provider pager numbers.

## 2019-11-28 NOTE — ED Notes (Signed)
Pt refusing rectal tube for diarrhea at this time. Pt prefers to be placed on the bedpan.

## 2019-11-28 NOTE — ED Notes (Signed)
Pt continuing to refuse rectal tube; rectal tube remains available if pt consents

## 2019-11-28 NOTE — Progress Notes (Signed)
Pt seen in ER as he is awaiting progressive care bed. Reviewed chart.  Pt is resting comfortably at this time with no complaints. States he is starting to feel better. Is on IVF hydration.  Is hemodynamically stable.  No concerns by RN staff at this time Continue current care while awaiting for transfer to progressive care unit.

## 2019-11-28 NOTE — Progress Notes (Signed)
Manufacturing engineer [facility]) liaison note  Received request from [place], NP with PMT, to meet with family to offer education on hospice at home vs outpt palliative services.  Met with pt, daughter Chong Sicilian and Dory Larsen and former spouse Pamala Hurry at bedside.  Services explained.  Pt had been referred to our outpt palliative services in August but had declined earlier admission visit; visit scheduled for 12/01/19.  At this time family and pt in agreement that hospices services are desired after discharge.  Chart under review by [facility] MD for eligibility.  DME needs discussed.  Hospital bed, gel overlay mattress and bedside commode to be delivered to home ahead of discharge.  Contact information left with family and patient.    Thank you for the opportunity to participate in this patient's care.    Domenic Moras, BSN, RN [facility] hospital liaison [phone] (24h on call)

## 2019-11-29 DIAGNOSIS — R0602 Shortness of breath: Secondary | ICD-10-CM

## 2019-11-29 MED ORDER — ADULT MULTIVITAMIN W/MINERALS CH
1.0000 | ORAL_TABLET | Freq: Every day | ORAL | Status: DC
  Administered 2019-11-29 – 2019-11-30 (×2): 1 via ORAL
  Filled 2019-11-29 (×2): qty 1

## 2019-11-29 MED ORDER — BOOST / RESOURCE BREEZE PO LIQD CUSTOM
1.0000 | Freq: Three times a day (TID) | ORAL | Status: DC
  Administered 2019-11-29 – 2019-11-30 (×3): 1 via ORAL

## 2019-11-29 NOTE — Progress Notes (Signed)
PROGRESS NOTE    Curtis Gentry  UJW:[id number] DOB: 09/23/1945 DOA: 11/27/2019 PCP: [name], MD   Brief Narrative:  Curtis Gentry is a 74 y.o. male with medical history significant of chronic hypoxemic respiratory failure-on 5 L of oxygen via nasal cannula secondary to underlying COPD, tobacco abuse, hyperlipidemia, protein calorie malnutrition, lung mass, GERD presents to emergency department with generalized weakness and diarrhea since 5 days. Patient tells me that he has loose stools since 5 days which is watery, nonbloody, dark [place] in color, associated with mucus, foul-smelling, too many episodes to count associated with generalized weakness, no energy.  He denies vomiting, abdominal pain, nausea, fever, chills, urinary symptoms.  No recent sick contact.  He lives with his daughter and son-in-law at home.  Due to worsening loose stool and generalized weakness family called EMS.  Patient's blood pressure noted to be 80 systolic, IVF bolus given in route to ED. He has chronic cough & shortness of breath however denies worsening respiratory symptoms, denies chest pain, wheezing, leg swelling, orthopnea, PND,  headache, blurry vision, syncope. He is on 5 L of oxygen at home all the time, continues to smoke 1 pack of cigarettes per day, denies alcohol, illicit drug use. He is DNR/DNI.  Refused further work-up for possible metastatic lung cancer in the past. Upon arrival to ED: Patient hypotensive, on 6 L of oxygen via nasal cannula, afebrile with leukocytosis of 10.8, H&H: 12.1/36.8, MCV: 102.2, platelet: 495, CMP shows potassium 3.1, CO2: 20, albumin: 1.9, magnesium: 1.9, POC occult blood positive, CT of: Negative, GI panel, COVID-19, BC, UC, UA: Pending.  CT chest, abdomen/pelvis shows 24 mm spiculated nodule in the right upper lobe suspicious for primary bronchogenic neoplasm, mildly progressive.  14 mm hyper enhancing lesion in the posterior right hepatic lobe, raising concern for metastasis.   Bilateral adrenal nodules, progressive from May 2021, raising concern for metastatic disease.  Patient was given IV fluid bolus, KCl and Imodium in ED.  [facility] consulted for admission for hypotension and dehydration.   Assessment & Plan:   Principal Problem:   Dehydration Active Problems:   Macrocytic anemia   Hypokalemia   Hyperlipidemia   Anxiety   Tobacco abuse   BPH (benign prostatic hyperplasia)   Lung mass   Thrombocytosis   COPD (chronic obstructive pulmonary disease) (HCC)   Chronic hypoxemic respiratory failure (HCC)   Advanced care planning, goals of care  -Plan at this time is to discharge patient home with hospice -Currently somewhat more hypoxic baseline in the setting of volume overload and worsening progressing likely metastatic lung disease. -Continue to wean off or down on oxygen as appropriate and discharge home with hospice in the next 24 to 48 hours pending clinical course and evaluation with hospice as well as case management for set up for durable medical equipment as indicated.  Profound hypotension and dehydration secondary to profuse diarrhea, resolving - C. difficile negative, Covid negative, likely viral gastroenteritis, improving with supportive care - Continue IV fluids.  Zofran as needed for nausea and vomiting and Imodium as needed for loose stool. -Patient refusing further imaging or GI consult for endoscopy given improving symptoms  Acute on chronic hypoxic respiratory failure, likely metastatic bronchogenic mass, worsening - Unlikely concurrent COPD exacerbation, without wheeze - Patient wears 5 to 6 L nasal cannula at home -sats appear to be in the mid 80s for the most part at home -Respiratory status continues to worsen in the setting of worsening metastatic disease and  likely volume overload, IV fluids held, follow clinically and wean oxygen as tolerated -Goal oxygen is 85% or better, or if patient is acutely symptomatic (tachypneic  or has dyspnea at rest)  Likely chronic iron deficiency anemia of with concurrent chronic anemia of chronic disease given above  -Continue to monitor. -Continue ferrous sulfate  Thrombocytosis: Platelet: 495 -Likely reactive, given above.  Hypokalemia: Replenished.  Magnesium level: WNL.  Repeat BMP tomorrow a.m.  Hyperlipidemia: Continue statin  Tobacco abuse: Counseled about cessation especially given concern for metastatic disease and lung lesion  Anxiety: Continue Valium as needed  Prolonged QT interval: Reviewed EKG.  Replenish potassium.  Monitor closely on telemetry.  Severe protein calorie malnutrition: -Albumin: 1.9.  Consult dietitian  DVT prophylaxis: SCD Code Status: DNR/DNI Family Communication: At bedside  Status is: Inpatient  Dispo: The patient is from: Home              Anticipated d/c is to: Likely home              Anticipated d/c date is: 24 to 48 hours              Patient currently not medically stable for discharge given need for ongoing IV fluids and close monitoring  Consultants:   Palliative care  Procedures:   None planned  Antimicrobials:  None indicated  Subjective: Worsening respiratory status overnight, IV fluids held, diarrhea resolved at this point and blood pressure is currently well controlled patient states he feels well just somewhat short of breath -biggest complaint this morning was that he was hungry and had not yet had breakfast.  Otherwise denies nausea, vomiting, diarrhea, constipation, headache, fevers, chills.  Objective: Vitals:   11/29/19 0702 11/29/19 0800 11/29/19 0805 11/29/19 1048  BP: 105/72     Pulse: 84 83    Resp: (!) 25     Temp: (!) 97.5 F (36.4 C) (!) 97.5 F (36.4 C)    TempSrc: Oral Oral    SpO2: 90%  95%   Weight:    59.4 kg   No intake or output data in the 24 hours ending 11/29/19 1219 Filed Weights   11/29/19 1048  Weight: 59.4 kg    Examination:  General: Cachectic appearing  gentleman resting comfortably in bed, No acute distress. HEENT:  Normocephalic atraumatic.  Sclerae nonicteric, noninjected.  Extraocular movements intact bilaterally. Neck:  Without mass or deformity.  Trachea is midline. Lungs:  Clear to auscultate bilaterally without rhonchi, wheeze, or rales. Heart:  Regular rate and rhythm.  Without murmurs, rubs, or gallops. Abdomen:  Soft, nontender, nondistended.  Without guarding or rebound. Extremities: Without cyanosis, clubbing, edema, or obvious deformity. Vascular:  Dorsalis pedis and posterior tibial pulses palpable bilaterally. Skin:  Warm and dry, no erythema, no ulcerations.   Data Reviewed: I have personally reviewed following labs and imaging studies  CBC: Recent Labs  Lab 11/27/19 1435 11/28/19 0401  WBC 10.8* 7.9  NEUTROABS 8.6*  --   HGB 12.1* 10.2*  HCT 36.8* 30.8*  MCV 102.2* 105.8*  PLT 495* 539*   Basic Metabolic Panel: Recent Labs  Lab 11/27/19 1435 11/28/19 2100  NA 139 141  K 3.1* 3.4*  CL 107 114*  CO2 20* 19*  GLUCOSE 107* 77  BUN 19 14  CREATININE 0.95 0.77  CALCIUM 8.3* 7.3*  MG 1.9  --    GFR: Estimated Creatinine Clearance: 68.1 mL/min (by C-G formula based on SCr of 0.77 mg/dL). Liver Function Tests: Recent Labs  Lab 11/27/19 1435  AST 23  ALT 16  ALKPHOS 112  BILITOT 0.6  PROT 5.4*  ALBUMIN 1.9*   No results for input(s): LIPASE, AMYLASE in the last 168 hours. No results for input(s): AMMONIA in the last 168 hours. Coagulation Profile: Recent Labs  Lab 11/27/19 1501 11/28/19 0401  INR 1.4* 1.4*   Cardiac Enzymes: No results for input(s): CKTOTAL, CKMB, CKMBINDEX, TROPONINI in the last 168 hours. BNP (last 3 results) No results for input(s): PROBNP in the last 8760 hours. HbA1C: No results for input(s): HGBA1C in the last 72 hours. CBG: No results for input(s): GLUCAP in the last 168 hours. Lipid Profile: No results for input(s): CHOL, HDL, LDLCALC, TRIG, CHOLHDL, LDLDIRECT in  the last 72 hours. Thyroid Function Tests: No results for input(s): TSH, T4TOTAL, FREET4, T3FREE, THYROIDAB in the last 72 hours. Anemia Panel: No results for input(s): VITAMINB12, FOLATE, FERRITIN, TIBC, IRON, RETICCTPCT in the last 72 hours. Sepsis Labs: Recent Labs  Lab 11/27/19 1501  LATICACIDVEN 1.9    Recent Results (from the past 240 hour(s))  Blood culture (routine single)     Status: None (Preliminary result)   Collection Time: 11/27/19  3:43 PM   Specimen: BLOOD  Result Value Ref Range Status   Specimen Description BLOOD RIGHT ANTECUBITAL  Final   Special Requests   Final    BOTTLES DRAWN AEROBIC ONLY Blood Culture results may not be optimal due to an inadequate volume of blood received in culture bottles   Culture   Final    NO GROWTH 2 DAYS Performed at [facility] [address] [place], [place] [address]    Report Status PENDING  Incomplete  C Difficile Quick Screen w PCR reflex     Status: None   Collection Time: 11/27/19  4:00 PM   Specimen: Stool  Result Value Ref Range Status   C Diff antigen NEGATIVE NEGATIVE Final   C Diff toxin NEGATIVE NEGATIVE Final   C Diff interpretation No C. difficile detected.  Final    Comment: Performed at [facility], [place] [address], [place], [place] [address]  Respiratory Panel by RT PCR (Flu A&B, Covid) - Nasopharyngeal Swab     Status: None   Collection Time: 11/27/19  7:25 PM   Specimen: Nasopharyngeal Swab  Result Value Ref Range Status   SARS Coronavirus 2 by RT PCR NEGATIVE NEGATIVE Final    Comment: (NOTE) SARS-CoV-2 target nucleic acids are NOT DETECTED.  The SARS-CoV-2 RNA is generally detectable in upper respiratoy specimens during the acute phase of infection. The lowest concentration of SARS-CoV-2 viral copies this assay can detect is 131 copies/mL. A negative result does not preclude SARS-Cov-2 infection and should not be used as the sole basis for treatment or other patient management  decisions. A negative result may occur with  improper specimen collection/handling, submission of specimen other than nasopharyngeal swab, presence of viral mutation(s) within the areas targeted by this assay, and inadequate number of viral copies (<131 copies/mL). A negative result must be combined with clinical observations, patient history, and epidemiological information. The expected result is Negative.  Fact Sheet for Patients:  PinkCheek.be  Fact Sheet for Healthcare Providers:  GravelBags.it  This test is no t yet approved or cleared by the [place] FDA and  has been authorized for detection and/or diagnosis of SARS-CoV-2 by FDA under an Emergency Use Authorization (EUA). This EUA will remain  in effect (meaning this test can be used) for  the duration of the COVID-19 declaration under Section 564(b)(1) of the Act, 21 U.S.C. section 360bbb-3(b)(1), unless the authorization is terminated or revoked sooner.     Influenza A by PCR NEGATIVE NEGATIVE Final   Influenza B by PCR NEGATIVE NEGATIVE Final    Comment: (NOTE) The Xpert Xpress SARS-CoV-2/FLU/RSV assay is intended as an aid in  the diagnosis of influenza from Nasopharyngeal swab specimens and  should not be used as a sole basis for treatment. Nasal washings and  aspirates are unacceptable for Xpert Xpress SARS-CoV-2/FLU/RSV  testing.  Fact Sheet for Patients: PinkCheek.be  Fact Sheet for Healthcare Providers: GravelBags.it  This test is not yet approved or cleared by the [place] FDA and  has been authorized for detection and/or diagnosis of SARS-CoV-2 by  FDA under an Emergency Use Authorization (EUA). This EUA will remain  in effect (meaning this test can be used) for the duration of the  Covid-19 declaration under Section 564(b)(1) of the Act, 21  U.S.C. section 360bbb-3(b)(1), unless the  authorization is  terminated or revoked. Performed at [facility], [place] [address], [place], [place] [address]   Urine culture     Status: Abnormal   Collection Time: 11/27/19 11:51 PM   Specimen: Urine, Random  Result Value Ref Range Status   Specimen Description URINE, RANDOM  Final   Special Requests   Final    NONE Performed at [facility], [place] [address] [place], [place] [address]    Culture MULTIPLE SPECIES PRESENT, SUGGEST RECOLLECTION (A)  Final   Report Status 11/28/2019 FINAL  Final         Radiology Studies: CT Angio Chest PE W and/or Wo Contrast  Result Date: 11/27/2019 CLINICAL DATA:  Weakness, abdominal pain, diarrhea. Hypotension and hypoxia. Evaluate for PE. EXAM: CT ANGIOGRAPHY CHEST CT ABDOMEN AND PELVIS WITH CONTRAST TECHNIQUE: Multidetector CT imaging of the chest was performed using the standard protocol during bolus administration of intravenous contrast. Multiplanar CT image reconstructions and MIPs were obtained to evaluate the vascular anatomy. Multidetector CT imaging of the abdomen and pelvis was performed using the standard protocol during bolus administration of intravenous contrast. CONTRAST:  117mL OMNIPAQUE IOHEXOL 350 MG/ML SOLN COMPARISON:  CTA chest dated 10/14/2019.  CT chest dated 07/15/2019. FINDINGS: CTA CHEST FINDINGS Cardiovascular: Satisfactory opacification of the bilateral pulmonary arteries to the segmental level. No evidence of pulmonary embolism. Study is not tailored for evaluation of the thoracic aorta. No evidence of thoracic aortic aneurysm. Atherosclerotic calcifications of the aortic arch. The heart is top-normal in size.  No pericardial effusion. Coronary atherosclerosis of the LAD and right coronary artery. Mediastinum/Nodes: 16 mm short axis subcarinal node, grossly unchanged, possibly reactive. No suspicious hilar lymphadenopathy. Visualized thyroid is unremarkable. Lungs/Pleura: 18 x 24 mm spiculated nodule in the right  upper lobe (series 5/image 30), previously 18 x 20 mm, suspicious for primary bronchogenic neoplasm. Multifocal patchy opacities posterior right upper lobe, right middle lobe, and right lower lobe. This is progressive from the prior, which was previously isolated to the right middle and lower lobes. While multifocal infection/pneumonia is possible, when correlating with multiple CTs, multifocal invasive adenocarcinoma with a lepidic growth pattern could demonstrate this appearance. Superimposed small right pleural effusion, mildly increased. Small left pleural effusion, new/increased. Associated left lower lobe atelectasis. Moderate to severe centrilobular and paraseptal emphysematous changes, upper lung predominant. 11 mm nodule at the left lung apex (series 5/image 14), unchanged, possibly reflecting apical pleural-parenchymal scarring. 6 mm nodule in  the left lower lobe (series 5/image 89) and 12 mm nodule in the medial left lower lobe posterior to the aorta (series 5/image 77), previously 4 and 7 mm respectively, suspicious for metastases. No pneumothorax. Musculoskeletal: Mild degenerative changes of the thoracic spine. Review of the MIP images confirms the above findings. CT ABDOMEN and PELVIS FINDINGS Hepatobiliary: 14 mm hypoenhancing lesion in the posterior right hepatic lobe (series 8/image 22), raising concern for metastasis. Distended gallbladder, without associated inflammatory changes. No intrahepatic or extrahepatic ductal dilatation. Pancreas: Within normal limits. Spleen: Within normal limits. Adrenals/Urinary Tract: Low-density bilateral adrenal nodules, measuring up to 2.6 cm on the left and 2.2 cm on the right (series 8/images 34 and 36), progressive from 07/15/2019 and worrisome for metastatic disease. Kidneys are within normal limits.  No hydronephrosis. Layering 8 mm bladder calculus (series 8/image 85). Stomach/Bowel: Postsurgical changes at the GE junction. Stomach is grossly unremarkable.  No evidence of bowel obstruction. Appendix is not discretely visualized. Extensive colonic diverticulosis, without evidence of diverticulitis. Vascular/Lymphatic: No evidence of abdominal aortic aneurysm. Atherosclerotic calcifications of the abdominal aorta and branch vessels. No suspicious abdominopelvic lymphadenopathy. Reproductive: Prostate is unremarkable. Other: No abdominopelvic ascites. Musculoskeletal: Mild degenerative changes of the lumbar spine. Review of the MIP images confirms the above findings. IMPRESSION: No evidence of pulmonary embolism. 24 mm spiculated nodule in the right upper lobe, suspicious for primary bronchogenic neoplasm, mildly progressive. Mildly progressive nodules in the left lower lobe, suspicious for metastases. Multifocal patchy opacities in the right lung, progressive across multiple studies. Given persistence, multifocal invasive adenocarcinoma demonstrating a lepidic growth pattern should be considered, although multifocal pneumonia is still possible. Associated small bilateral pleural effusions, new/progressive. Mildly prominent mediastinal node, grossly unchanged. 14 mm hypoenhancing lesion in the posterior right hepatic lobe, raising concern for metastasis. Bilateral adrenal nodules, progressive from May 2021, raising concern for metastatic disease. Aortic Atherosclerosis (ICD10-I70.0) and Emphysema (ICD10-J43.9). Electronically Signed   By: Julian Hy M.D.   On: 11/27/2019 17:39   CT ABDOMEN PELVIS W CONTRAST  Result Date: 11/27/2019 CLINICAL DATA:  Weakness, abdominal pain, diarrhea. Hypotension and hypoxia. Evaluate for PE. EXAM: CT ANGIOGRAPHY CHEST CT ABDOMEN AND PELVIS WITH CONTRAST TECHNIQUE: Multidetector CT imaging of the chest was performed using the standard protocol during bolus administration of intravenous contrast. Multiplanar CT image reconstructions and MIPs were obtained to evaluate the vascular anatomy. Multidetector CT imaging of the abdomen  and pelvis was performed using the standard protocol during bolus administration of intravenous contrast. CONTRAST:  161mL OMNIPAQUE IOHEXOL 350 MG/ML SOLN COMPARISON:  CTA chest dated 10/14/2019.  CT chest dated 07/15/2019. FINDINGS: CTA CHEST FINDINGS Cardiovascular: Satisfactory opacification of the bilateral pulmonary arteries to the segmental level. No evidence of pulmonary embolism. Study is not tailored for evaluation of the thoracic aorta. No evidence of thoracic aortic aneurysm. Atherosclerotic calcifications of the aortic arch. The heart is top-normal in size.  No pericardial effusion. Coronary atherosclerosis of the LAD and right coronary artery. Mediastinum/Nodes: 16 mm short axis subcarinal node, grossly unchanged, possibly reactive. No suspicious hilar lymphadenopathy. Visualized thyroid is unremarkable. Lungs/Pleura: 18 x 24 mm spiculated nodule in the right upper lobe (series 5/image 30), previously 18 x 20 mm, suspicious for primary bronchogenic neoplasm. Multifocal patchy opacities posterior right upper lobe, right middle lobe, and right lower lobe. This is progressive from the prior, which was previously isolated to the right middle and lower lobes. While multifocal infection/pneumonia is possible, when correlating with multiple CTs, multifocal invasive adenocarcinoma with a lepidic growth pattern  could demonstrate this appearance. Superimposed small right pleural effusion, mildly increased. Small left pleural effusion, new/increased. Associated left lower lobe atelectasis. Moderate to severe centrilobular and paraseptal emphysematous changes, upper lung predominant. 11 mm nodule at the left lung apex (series 5/image 14), unchanged, possibly reflecting apical pleural-parenchymal scarring. 6 mm nodule in the left lower lobe (series 5/image 89) and 12 mm nodule in the medial left lower lobe posterior to the aorta (series 5/image 77), previously 4 and 7 mm respectively, suspicious for metastases. No  pneumothorax. Musculoskeletal: Mild degenerative changes of the thoracic spine. Review of the MIP images confirms the above findings. CT ABDOMEN and PELVIS FINDINGS Hepatobiliary: 14 mm hypoenhancing lesion in the posterior right hepatic lobe (series 8/image 22), raising concern for metastasis. Distended gallbladder, without associated inflammatory changes. No intrahepatic or extrahepatic ductal dilatation. Pancreas: Within normal limits. Spleen: Within normal limits. Adrenals/Urinary Tract: Low-density bilateral adrenal nodules, measuring up to 2.6 cm on the left and 2.2 cm on the right (series 8/images 34 and 36), progressive from 07/15/2019 and worrisome for metastatic disease. Kidneys are within normal limits.  No hydronephrosis. Layering 8 mm bladder calculus (series 8/image 85). Stomach/Bowel: Postsurgical changes at the GE junction. Stomach is grossly unremarkable. No evidence of bowel obstruction. Appendix is not discretely visualized. Extensive colonic diverticulosis, without evidence of diverticulitis. Vascular/Lymphatic: No evidence of abdominal aortic aneurysm. Atherosclerotic calcifications of the abdominal aorta and branch vessels. No suspicious abdominopelvic lymphadenopathy. Reproductive: Prostate is unremarkable. Other: No abdominopelvic ascites. Musculoskeletal: Mild degenerative changes of the lumbar spine. Review of the MIP images confirms the above findings. IMPRESSION: No evidence of pulmonary embolism. 24 mm spiculated nodule in the right upper lobe, suspicious for primary bronchogenic neoplasm, mildly progressive. Mildly progressive nodules in the left lower lobe, suspicious for metastases. Multifocal patchy opacities in the right lung, progressive across multiple studies. Given persistence, multifocal invasive adenocarcinoma demonstrating a lepidic growth pattern should be considered, although multifocal pneumonia is still possible. Associated small bilateral pleural effusions,  new/progressive. Mildly prominent mediastinal node, grossly unchanged. 14 mm hypoenhancing lesion in the posterior right hepatic lobe, raising concern for metastasis. Bilateral adrenal nodules, progressive from May 2021, raising concern for metastatic disease. Aortic Atherosclerosis (ICD10-I70.0) and Emphysema (ICD10-J43.9). Electronically Signed   By: Julian Hy M.D.   On: 11/27/2019 17:39   DG Chest Port 1 View  Result Date: 11/27/2019 CLINICAL DATA:  Questionable sepsis. EXAM: PORTABLE CHEST 1 VIEW COMPARISON:  CT and radiographs from 10/14/2019 FINDINGS: Increased consolidation in the right lower lobe. As before, intermixed lucencies within the area of consolidation. Similar right paratracheal opacity, likely related to spiculated mass seen on prior CT. Stable cardiomediastinal silhouette, partially obscured. Limited evaluation for right pleural effusion. No evidence of a left pleural effusion. No pneumothorax. Clips in the region of the gastroesophageal junction. IMPRESSION: 1. Increased consolidation within the right lower lobe, concerning for pneumonia. Intermixed lucencies within this consolidation, which is nonospecific by radiograph but may represent necrotizing pneumonia given findings on prior chest CT. Chest CT with contrast could further characterize, if clinically indicated. 2. Emphysematous disease. 3. Persistent right medial apical ill-defined opacity, likely correlating to the spiculated mass seen on prior CT. Electronically Signed   By: Margaretha Sheffield MD   On: 11/27/2019 15:15        Scheduled Meds: . aspirin EC  81 mg Oral Daily  . atorvastatin  10 mg Oral QHS  . feeding supplement  1 Container Oral TID BM  . ferrous sulfate  325 mg Oral Q  breakfast  . gabapentin  300 mg Oral Q breakfast   And  . gabapentin  600 mg Oral QHS  . liver oil-zinc oxide   Topical BID  . multivitamin with minerals  1 tablet Oral Daily  . nicotine  21 mg Transdermal Daily  . pantoprazole   40 mg Oral Daily  . umeclidinium-vilanterol  1 puff Inhalation Daily   Continuous Infusions: . 0.9 % NaCl with KCl 20 mEq / L 125 mL/hr at 11/28/19 2253     LOS: 2 days   Time spent: 77min  Perpetua Elling C Kiet Geer, DO [facility]  If 7PM-7AM, please contact night-coverage [email or web address]  11/29/2019, 12:19 PM

## 2019-11-29 NOTE — Progress Notes (Signed)
Pt arrived on the unit, AOX4. SpO2 87% on 15L heated high flow and NRB. RT came with pt. VS taken. Pt placed on tele monitor. MD aware, NT suctioning prn ordered. Pt labored breathing. SpO2 90%. RR 26 at this time. Unable to do skin assessment and turn pt on the bed due to O2 demand. Will notify day RN.

## 2019-11-29 NOTE — Progress Notes (Signed)
This chaplain was present for F/U spiritual care.  The Pt. is awake and conversational at the time of the visit.  The Pt. is eating an [place] and remains focused on Hospice discharge today. The chaplain hears the Pt. clearly describe he has everything at home he needs at home and the support of his family.  The Pt. chooses to share, I checked myself into the hospital, I can check my self out."  The Pt accepts, for the moment, the chaplain's encouragement to practice patience and communication in the process. The Pt. is open to prayer with the chaplain and requests a F/U visit.

## 2019-11-29 NOTE — Progress Notes (Addendum)
Manufacturing engineer [facility]) liaison note  Received request from [place], NP with PMT, to meet with family to offer education on hospice at home vs outpt palliative services.  Spoke with [name] the daughter and she discussed that her father's oxygen level on 6L is normally 87-89% and is willing to take the patient home on the same 6L to get him home. Plan is to d.c home tomorrow on 6L.Marland KitchenPatient can go by personal vehicle, and they are providing oxygen also for ride home.  Patient has a scheduled RN visit with [facility] around 330pm so will need patient home by then. Patient is eligible for hospice.  DME needs discussed.  Hospital bed, gel overlay mattress and bedside commode to be delivered to home ahead of discharge.  Contact information left with family and patient.    Thank you for the opportunity to participate in this patient's care.    Clementeen Hoof, BSN, RN [facility] hospital liaison [phone]

## 2019-11-29 NOTE — Progress Notes (Addendum)
Nutrition Follow-up  DOCUMENTATION CODES:   Severe malnutrition in context of chronic illness  INTERVENTION:   -MVI with minerals daily -Boost Breeze po TID, each supplement provides 250 kcal and 9 grams of protein -RD will follow for diet advancement and adjust supplement regimen as appropriate  NUTRITION DIAGNOSIS:   Severe Malnutrition related to chronic illness (COPD) as evidenced by estimated needs.  GOAL:   Patient will meet greater than or equal to 90% of their needs  MONITOR:   PO intake, Supplement acceptance, Diet advancement, Labs, Weight trends, Skin, I & O's  REASON FOR ASSESSMENT:   Consult Assessment of nutrition requirement/status  ASSESSMENT:   Curtis Gentry is a 74 y.o. male with medical history significant of chronic hypoxemic respiratory failure-on 5 L of oxygen via nasal cannula secondary to underlying COPD, tobacco abuse, hyperlipidemia, protein calorie malnutrition, lung mass, GERD presents to emergency department with generalized weakness and diarrhea since 5 days.  Pt admitted with dehydration secondary to profuse diarrhea.   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   Spoke with pt at bedside. RD asked mainly close ended questions due to pt on non-rebreather mask. He reports that it is difficult for him to seat secondary to respiratory status, however, has been sipping on gingerale (RD provided pt with a few sips during visit per his request). Pt shares that PTA he had "no problems eating". He denies any diarrhea today.   Pt is unsure if he has lost. He reports that last time he was weighed was approximately 8 months ago at his PCP's office, where he weighed 120#. He estimates that this is his UBW and has always been small framed. Wt obtained via bedscale, however, RD questions accuracy of this wt.   Per  chart review, pt refusing feeding tube and rectal tube. Per palliative care notes, plan to medically optimize in the hospital and transition to hospice services at home.   Labs reviewed: K: 3.4.   NUTRITION - FOCUSED PHYSICAL EXAM:    Most Recent Value  Orbital Region Severe depletion  Upper Arm Region Severe depletion  Thoracic and Lumbar Region Moderate depletion  Buccal Region Severe depletion  Temple Region Severe depletion  Clavicle Bone Region Severe depletion  Clavicle and Acromion Bone Region Severe depletion  Scapular Bone Region Severe depletion  Dorsal Hand Moderate depletion  Patellar Region Severe depletion  Anterior Thigh Region Severe depletion  Posterior Calf Region Severe depletion  Edema (RD Assessment) None  Hair Reviewed  Eyes Reviewed  Mouth Reviewed  Skin Reviewed  Nails Reviewed       Diet Order:   Diet Order            Diet clear liquid Room service appropriate? Yes; Fluid consistency: Thin  Diet effective now                 EDUCATION NEEDS:   Not appropriate for education at this time  Skin:  Skin Assessment: Reviewed RN Assessment  Last BM:  11/28/19  Height:   Ht Readings from Last 1 Encounters:  10/14/19 5\' 8"  (1.727 m)    Weight:   Wt Readings from Last 1 Encounters:  11/29/19 59.4 kg    Ideal Body Weight:  70 kg  BMI:  Body mass index is 19.91 kg/m.  Estimated Nutritional Needs:   Kcal:  2100-2300  Protein:  115-130 grams  Fluid:  > 2 L    Loistine Chance, RD, LDN, [place] Registered Dietitian II Certified Diabetes Care and Education Specialist Please refer to [facility] for RD and/or RD on-call/weekend/after hours pager

## 2019-11-29 NOTE — Progress Notes (Signed)
Due to patient increased O2 demands. RRT transported with ED RN to [place]. No complications noted. Saturations stable

## 2019-11-29 NOTE — TOC Initial Note (Signed)
Transition of Care (TOC) - Initial/Assessment Note  Marvetta Gibbons RN, BSN Transitions of Care Unit 4E- RN Case Manager See Treatment Team for direct phone # Cross coverage for [place]    Patient Details  Name: Curtis Gentry MRN: [id number] Date of Birth: 03/09/1945  Transition of Care [facility]) CM/SW Contact:    Dawayne Patricia, RN Phone Number: 11/29/2019, 1:24 PM  Clinical Narrative:                 Notified by [facility] team that pt wants to return home with family and home hospice services as soon as possible. Pt was active with [facility] prior to admission and would like to continue with them for Home Hospice needs- PC has already contacted them for transition needs- Melissa with [facility] is working on home hospice arrangements and DME needs.  Pt already has home 02 in the home.  Plan will be to have pt go home tomorrow once DME has been delivered. Family would like to transport via private car.  [facility] can have home hospice RN at the home around 1:30 pm to admit into hospice services- will need to have pt discharged by noon in order to be home for this appointment time.  Will need GOLD DNR for home.  TOC will f/u in am   Expected Discharge Plan: Home w Hospice Care Barriers to Discharge: Equipment Delay (awaiting DME delivery with Home Hospice)   Patient Goals and CMS Choice Patient states their goals for this hospitalization and ongoing recovery are:: return home with Home Hospice CMS Medicare.gov Compare Post Acute Care list provided to:: Patient Choice offered to / list presented to : Patient  Expected Discharge Plan and Services Expected Discharge Plan: [place]   Discharge Planning Services: CM Consult Post Acute Care Choice: Hospice Living arrangements for the past 2 months: Single Family Home                 DME Arranged: Hospice Equipment Package A DME Agency: [employer] Date DME Agency Contacted: 11/29/19 Time DME Agency Contacted:  (Per Hospice)   [place] Arranged:  Disease Management (Hopsice) [place]: Hospice and [place] Date [place]: 11/29/19 Time HH Agency Contacted:  (Contacted per [employer]) Representative spoke with at [place]: [place] Arrangements/Services Living arrangements for the past 2 months: [place] with:: Adult Children Patient language and need for interpreter reviewed:: Yes Do you feel safe going back to the place where you live?: Yes      Need for Family Participation in Patient Care: Yes (Comment) Care giver support system in place?: Yes (comment) Current home services: DME Criminal Activity/Legal Involvement Pertinent to Current Situation/Hospitalization: No - Comment as needed  Activities of Daily Living      Permission Sought/Granted Permission sought to share information with : Facility Art therapist granted to share information with : Yes, Verbal Permission Granted     Permission granted to share info w AGENCY: Hospice        Emotional Assessment Appearance:: Appears stated age Attitude/Demeanor/Rapport: Engaged Affect (typically observed): Accepting Orientation: : Oriented to Self, Oriented to Place, Oriented to  Time, Oriented to Situation Alcohol / Substance Use: Not Applicable Psych Involvement: No (comment)  Admission diagnosis:  Dehydration [E86.0] Hypokalemia [E87.6] Chronic respiratory failure with hypoxia (HCC) [J96.11] Hypotension, unspecified hypotension type [I95.9] Diarrhea, unspecified type [R19.7] Patient Active Problem List   Diagnosis Date Noted  . Shortness of breath   .  Diarrhea   . Hypotension   . Palliative care by specialist   . Goals of care, counseling/discussion   . Encounter for hospice care discussion   . DNR (do not resuscitate)   . DNI (do not intubate)   . Generalized weakness   . Palliative care encounter   . Dehydration 11/27/2019  . Thrombocytosis 11/27/2019  . COPD (chronic obstructive  pulmonary disease) ([place]) 11/27/2019  . Chronic hypoxemic respiratory failure ([place]) 11/27/2019  . Acute respiratory failure ([place]) 10/14/2019  . Lung mass 10/14/2019  . CAP (community acquired pneumonia) 10/14/2019  . Acute respiratory failure with hypoxia ([place]) 10/14/2019  . Tobacco abuse 10/03/2016  . BPH (benign prostatic hyperplasia) 10/03/2016  . SDH (subdural hematoma) ([place]) 10/03/2016  . Slurred speech 10/03/2016  . Hyperlipidemia   . Anxiety   . 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 Abnormality of gait 05/03/2015  . Hereditary and idiopathic peripheral neuropathy 05/03/2015  . Hypokalemia 08/21/2011  . Syncope and collapse 08/20/2011  . Macrocytic anemia 09/06/2008  . DYSPHAGIA 09/06/2008   PCP:  [name], MD Pharmacy:   [place] ([place]), [place] - [address] [place] [address] Phone: [phone] Fax: [phone]     Social Determinants of Health (SDOH) Interventions    Readmission Risk Interventions No flowsheet data found.

## 2019-11-29 NOTE — Progress Notes (Signed)
Daily Progress Note   Patient Name: Curtis Gentry       Date: 11/29/2019 DOB: 18-Jan-1946  Age: 74 y.o. MRN#: [id number] Attending Physician: Little Ishikawa, MD Primary Care Physician: [name], MD Admit Date: 11/27/2019  Reason for Consultation/Follow-up: Disposition, Non pain symptom management, Pain control and Psychosocial/spiritual support  Subjective: Chart review performed. Received report from primary RN - no acute concerns at this time. Overnight patient has had increasing oxygen requirement - he is now on 25L heated high flow [place] with sats in the low to mid 90s.   Went to visit patient at bedside - no family/visitors present. Patient was lying in bed asleep - he did wake to voice and gentle touch. He denies pain and minimal shortness of breath. No signs or non-verbal gestures of pain or discomfort noted. No respiratory distress, increased work of breathing, or secretions noted.   Reviewed with patient the conversation and decisions from yesterday. He is still wanting discharge home with hospice. Discussed concerns about his increased O2 requirement and uncertainty if he would tolerate discharge/transport back home.  Informed him that currently he is requiring 25L oxygen and at home the highest that can be supported is 15L. Risk of passing away during transport was discussed in detail. Patient expressed his clear desire to pass away at home and stated he was willing to take the risk - he stated "I will check myself out if I have to." Explained that if the goal is to discharge with home hospice, we will need to try and get him home as soon as possible before missing the very small window of opportunity that might be left - he is willing and ready to discharge today/as soon as  possible.   I spoke with hospice liaison/Mary Webb Silversmith and explained current situation. She will reach out to family to discuss current situation and what is needed to expedite the process of getting the patient home. I appreciate her help.   All questions and concerns addressed with patient. Encouraged to call with questions and/or concerns.   Length of Stay: 2  Current Medications: Scheduled Meds:  . aspirin EC  81 mg Oral Daily  . atorvastatin  10 mg Oral QHS  . feeding supplement  1 Container Oral TID BM  . ferrous sulfate  325 mg Oral  Q breakfast  . gabapentin  300 mg Oral Q breakfast   And  . gabapentin  600 mg Oral QHS  . liver oil-zinc oxide   Topical BID  . multivitamin with minerals  1 tablet Oral Daily  . nicotine  21 mg Transdermal Daily  . pantoprazole  40 mg Oral Daily  . umeclidinium-vilanterol  1 puff Inhalation Daily    Continuous Infusions: . 0.9 % NaCl with KCl 20 mEq / L 125 mL/hr at 11/28/19 2253    PRN Meds: acetaminophen **OR** acetaminophen, albuterol, diazepam, loperamide, ondansetron **OR** ondansetron (ZOFRAN) IV  Physical Exam Vitals and nursing note reviewed.  Constitutional:      General: He is not in acute distress.    Appearance: He is ill-appearing.  Pulmonary:     Effort: No respiratory distress.  Skin:    General: Skin is warm and dry.     Coloration: Skin is pale.  Neurological:     Mental Status: He is alert and oriented to person, place, and time.     Motor: Weakness present.  Psychiatric:        Attention and Perception: Attention normal.        Cognition and Memory: Cognition and memory normal.             Vital Signs: BP 105/72 (BP Location: Right Arm)   Pulse 83   Temp (!) 97.5 F (36.4 C) (Oral)   Resp (!) 25   Wt 59.4 kg Comment: bedscale  SpO2 95%   BMI 19.91 kg/m  SpO2: SpO2: 95 % O2 Device: O2 Device: High Flow Nasal Cannula, NRB O2 Flow Rate: O2 Flow Rate (L/min): 25 L/min  Intake/output summary: No intake or  output data in the 24 hours ending 11/29/19 1127 LBM: Last BM Date: 11/28/19 Baseline Weight: Weight: 59.4 kg (bedscale) Most recent weight: Weight: 59.4 kg (bedscale)       Palliative Assessment/Data: PPS 30%    Flowsheet Rows     Most Recent Value  Intake Tab  Referral Department Hospitalist  Unit at Time of Referral ER  Palliative Care Primary Diagnosis Cancer  Date Notified 11/27/19  Palliative Care Type New Palliative care  Reason for referral Clarify Goals of Care  Date of Admission 11/27/19  Date first seen by Palliative Care 11/27/19  # of days Palliative referral response time 0 Day(s)  # of days IP prior to Palliative referral 0  Clinical Assessment  Psychosocial & Spiritual Assessment  Palliative Care Outcomes  Patient/Family meeting held? Yes  Who was at the meeting? patient, daughter  Palliative Care Outcomes Clarified goals of care, Counseled regarding hospice, Provided psychosocial or spiritual support  Patient/Family wishes: Interventions discontinued/not started  Mechanical Ventilation, Trach, PEG, Tube feedings/TPN      Patient Active Problem List   Diagnosis Date Noted  . Diarrhea   . Hypotension   . Palliative care by specialist   . Goals of care, counseling/discussion   . Encounter for hospice care discussion   . DNR (do not resuscitate)   . DNI (do not intubate)   . Generalized weakness   . Palliative care encounter   . Dehydration 11/27/2019  . Thrombocytosis 11/27/2019  . COPD (chronic obstructive pulmonary disease) ([place]) 11/27/2019  . Chronic hypoxemic respiratory failure ([place]) 11/27/2019  . Acute respiratory failure ([place]) 10/14/2019  . Lung mass 10/14/2019  . CAP (community acquired pneumonia) 10/14/2019  . Acute respiratory failure with hypoxia ([place]) 10/14/2019  . Tobacco abuse 10/03/2016  . BPH (  benign prostatic hyperplasia) 10/03/2016  . SDH (subdural hematoma) ([place]) 10/03/2016  . Slurred speech 10/03/2016  . Hyperlipidemia   .  Anxiety   . 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 Abnormality of gait 05/03/2015  . Hereditary and idiopathic peripheral neuropathy 05/03/2015  . Hypokalemia 08/21/2011  . Syncope and collapse 08/20/2011  . Macrocytic anemia 09/06/2008  . DYSPHAGIA 09/06/2008    Palliative Care Assessment & Plan   Patient Profile: 74 y.o. male  with past medical history of COPD on chronic 5L oxygen, necrotizing pneumonia, hyperlipidemia, dysphagia, lung mass, peptic ulcer disease, peripheral neuropathy, BPH, anxiety, arthritis presented to the ED on 11/27/19 for generalized weakness, abdominal pain, and diarrhea for 2 weeks.  ED Course: Upon arrival to ED: Patient hypotensive, on 6 L of oxygen via nasal cannula, afebrile with leukocytosis of 10.8, H&H: 12.1/36.8, MCV: 102.2, platelet: 495, CMP shows potassium 3.1, CO2: 20, albumin: 1.9, magnesium: 1.9, POC occult blood positive, CT of: Negative, GI panel, COVID-19, BC, UC, UA: Pending. CT chest, abdomen/pelvis shows 24 mm spiculated nodule in the right upper lobe suspicious for primary bronchogenic neoplasm, mildly progressive. 14 mm hyper enhancing lesion in the posterior right hepatic lobe, raising concern for metastasis. Bilateral adrenal nodules, progressive from May 2021, raising concern for metastatic disease. Patient was given IV fluid bolus, KCl and Imodium in ED. [facility] hospitalist consulted for admission for hypotension and dehydration.  Of note, patient was admitted from 8/20-8/23/21 and discharged with [facility] outpatient Palliative Care and HHPT. [facility] had set up an initial meeting for 12/01/19.  Assessment: Dehydration Hypokalemia Hyperlipidemia Anxiety Tobacco abuse BPH Lung mass with metastasis  COPD Chronic hypoxic respiratory failure Severe malnutrition  Recommendations/Plan:  Continue current medical treatment without invasive procedures/tests  Continue DNR/DNI as previously documented  Patient wants to discharge  home with hospice as soon as possible. [place] hospice liaison notified - initial RN visit and DME equipment is scheduled for delivery tomorrow 10/5. Plan is for discharge tomorrow morning if patient is stable.   PMT will continue to follow holistically  Goals of Care and Additional Recommendations:  Limitations on Scope of Treatment: Full Scope Treatment, No Chemotherapy, No Diagnostics, No Radiation, No Surgical Procedures and No Tracheostomy  Code Status:    Code Status Orders  (From admission, onward)         Start     Ordered   11/27/19 1816  Do not attempt resuscitation (DNR)  Continuous       Question Answer Comment  In the event of cardiac or respiratory ARREST Do not call a "code blue"   In the event of cardiac or respiratory ARREST Do not perform Intubation, CPR, defibrillation or ACLS   In the event of cardiac or respiratory ARREST Use medication by any route, position, wound care, and other measures to relive pain and suffering. May use oxygen, suction and manual treatment of airway obstruction as needed for comfort.      11/27/19 1816        Code Status History    Date Active Date Inactive Code Status Order ID Comments User Context   11/27/2019 1522 11/27/2019 1816 DNR [id number]  Lennice Sites, DO ED   10/14/2019 2351 10/17/2019 2108 DNR [id number]  Rise Patience, MD Inpatient   10/03/2016 2341 10/04/2016 2025 Full Code [id number]  Ivor Costa, MD ED   Advance Care Planning Activity       Prognosis:   < 6 months  Discharge Planning:  Home with Hospice  Care plan  was discussed with primary RN, [facility] hospice liaison, Dr. Avon Gully, [facility]  Thank you for allowing the Palliative Medicine Team to assist in the care of this patient.   Total Time  40 minutes Prolonged Time Billed  no       Greater than 50%  of this time was spent counseling and coordinating care related to the above assessment and plan.  Lin Landsman, NP  Please contact Palliative Medicine Team  phone at [phone] for questions and concerns.

## 2019-11-29 NOTE — Progress Notes (Signed)
   11/29/19 0647  Assess: MEWS Score  Temp (!) 97.5 F (36.4 C)  BP 105/72  Pulse Rate 86  ECG Heart Rate 84  Resp (!) 27  Level of Consciousness Alert  SpO2 (!) 89 %  O2 Device HFNC;Non-rebreather Mask  Patient Activity (if Appropriate) In bed  Heater temperature 91.6 F (33.1 C)  O2 Flow Rate (L/min) 35 L/min  FiO2 (%) 100 %  Assess: MEWS Score  MEWS Temp 0  MEWS Systolic 0  MEWS Pulse 0  MEWS RR 2  MEWS LOC 0  MEWS Score 2  MEWS Score Color Yellow  Assess: if the MEWS score is Yellow or Red  Were vital signs taken at a resting state? Yes  Focused Assessment No change from prior assessment  Early Detection of Sepsis Score *See Row Information* Low  MEWS guidelines implemented *See Row Information* Yes  Treat  MEWS Interventions Escalated (See documentation below);Consulted Respiratory Therapy (RT preseent at bedside)  Pain Score 0  Take Vital Signs  Increase Vital Sign Frequency  Yellow: Q 2hr X 2 then Q 4hr X 2, if remains yellow, continue Q 4hrs  Escalate  MEWS: Escalate Yellow: discuss with charge nurse/RN and consider discussing with provider and RRT  Notify: Charge Nurse/RN  Name of Charge Nurse/RN Notified Lyn   Date Charge Nurse/RN Notified 11/29/19  Time Charge Nurse/RN Notified 5520  Notify: Provider  Provider Name/Title [place]  Date Provider Notified 11/29/19  Time Provider Notified 0630  Notification Type Page  Notification Reason Other (Comment) (Need NT suctioning)  Response See new orders  Date of Provider Response 11/29/19  Time of Provider Response 0631  Notify: Rapid Response  Name of Rapid Response RN Notified n/a  Document  Patient Outcome Not stable and remains on department  Progress note created (see row info) Yes

## 2019-11-29 NOTE — Therapy (Signed)
Pt weaned to 10L and 80% HHFNC

## 2019-11-29 NOTE — ED Notes (Signed)
Ordered hospital bed--Curtis Gentry 

## 2019-11-30 DIAGNOSIS — E43 Unspecified severe protein-calorie malnutrition: Secondary | ICD-10-CM | POA: Diagnosis not present

## 2019-11-30 DIAGNOSIS — J9620 Acute and chronic respiratory failure, unspecified whether with hypoxia or hypercapnia: Secondary | ICD-10-CM | POA: Diagnosis not present

## 2019-11-30 DIAGNOSIS — R197 Diarrhea, unspecified: Secondary | ICD-10-CM | POA: Diagnosis not present

## 2019-11-30 DIAGNOSIS — J449 Chronic obstructive pulmonary disease, unspecified: Secondary | ICD-10-CM | POA: Diagnosis not present

## 2019-11-30 DIAGNOSIS — L89151 Pressure ulcer of sacral region, stage 1: Secondary | ICD-10-CM | POA: Diagnosis not present

## 2019-11-30 DIAGNOSIS — Z681 Body mass index (BMI) 19 or less, adult: Secondary | ICD-10-CM | POA: Diagnosis not present

## 2019-11-30 DIAGNOSIS — Z9981 Dependence on supplemental oxygen: Secondary | ICD-10-CM | POA: Diagnosis not present

## 2019-11-30 DIAGNOSIS — R69 Illness, unspecified: Secondary | ICD-10-CM | POA: Diagnosis not present

## 2019-11-30 DIAGNOSIS — C3411 Malignant neoplasm of upper lobe, right bronchus or lung: Secondary | ICD-10-CM | POA: Diagnosis not present

## 2019-11-30 DIAGNOSIS — E785 Hyperlipidemia, unspecified: Secondary | ICD-10-CM | POA: Diagnosis not present

## 2019-11-30 LAB — GASTROINTESTINAL PANEL BY PCR, STOOL (REPLACES STOOL CULTURE)

## 2019-11-30 MED ORDER — LOPERAMIDE HCL 2 MG PO CAPS: 4.0000 mg | ORAL_CAPSULE | ORAL | 0 refills | Status: AC | PRN

## 2019-11-30 NOTE — Progress Notes (Signed)
Pt SpO2 sustaining in the 70's. Placed back on 15L. SpO2 82% at this time. Will notify day RN

## 2019-11-30 NOTE — TOC Transition Note (Signed)
Transition of Care [facility]) - CM/SW Discharge Note   Patient Details  Name: Curtis Gentry MRN: [id number] Date of Birth: 12/15/1945  [facility]) CM/SW Contact:  Zenon Mayo, RN Phone Number: 11/30/2019, 10:11 AM   Clinical Narrative:    NCM spoke with daughter Chong Sicilian at [phone], she would like patient to have ptar transport, NCM set transport up .  Packet is at front desk. NCM notified Venia Carbon with [facility] , ptar set up .   Final next level of care: Home w Hospice Care Barriers to Discharge: No Barriers Identified   Patient Goals and CMS Choice Patient states their goals for this hospitalization and ongoing recovery are:: home with hospice CMS Medicare.gov Compare Post Acute Care list provided to:: Patient Represenative (must comment) Choice offered to / list presented to : Adult Children  Discharge Placement                       Discharge Plan and Services   Discharge Planning Services: CM Consult Post Acute Care Choice: Hospice          DME Arranged:  (DME supplied by [facility]) DME Agency: [employer] Date DME Agency Contacted: 11/29/19 Time DME Agency Contacted:  (Per Hospice)   [place] Arranged: RN [place] Agency:  ([place]) Date [place]: 11/29/19 Time HH Agency Contacted: 1000 Representative spoke with at [place]: [place] (SDOH) Interventions     Readmission Risk Interventions No flowsheet data found.

## 2019-11-30 NOTE — Progress Notes (Deleted)
Spoke to the patient's daughter Lynelle Smoke about patient belonging left after discharge. Belonging consist of wallet, candy and manual razor. Belonging are currently left at the [place] and family will come sometime today to retrieve the items.

## 2019-11-30 NOTE — Therapy (Signed)
Pt FiO2 weaned to 60% still on 10L. Will continue monitor.

## 2019-11-30 NOTE — Progress Notes (Signed)
This chaplain attempted F/U spiritual care with the Pt.  The chaplain observed the Pt. sleeping comfortably.  The chaplain learned from the Pt. RN-[place], plans are moving forward for the Pt. to transition home with Hospice.

## 2019-11-30 NOTE — Discharge Summary (Signed)
Physician Discharge Summary  Curtis Gentry YBO:[id number] DOB: 1945-08-03 DOA: 11/27/2019  PCP: [name], MD  Admit date: 11/27/2019 Discharge date: 11/30/2019  Admitted From: Home Disposition: Home with hospice  Recommendations for Outpatient Follow-up:  Follow up with palliative care if worsening symptoms, pain, respiratory distress.  Discharge Condition: Guarded CODE STATUS: DNR Diet recommendation: Regular diet as tolerated  Brief/Interim Summary: Curtis Knutzen Brownis a 74 y.o.malewith medical history significant ofchronic hypoxemic respiratory failure-on 5 L of oxygen via nasal cannula secondary to underlying COPD, tobacco abuse, hyperlipidemia, protein calorie malnutrition, lung mass, GERD presents to emergency department with generalized weakness and diarrhea since 5 days. Patient tells me that he has loose stools since 5 days which is watery, nonbloody, dark Mosher in color, associated with mucus, foul-smelling, too many episodes to count associated with generalized weakness, no energy. He denies vomiting, abdominal pain, nausea, fever, chills, urinary symptoms. No recent sick contact. He lives with his daughter and son-in-law at home. Due to worsening loose stool and generalized weakness family called EMS. Patient's blood pressure noted to ID78 systolic,IVFbolus given in route to ED. He has chronic cough&shortness of breath however denies worseningrespiratory symptoms, denies chest pain, wheezing, leg swelling, orthopnea, PND, headache, blurry vision, syncope. He is on 5 L of oxygen at home all the time, continues to smoke 1 pack of cigarettes per day, denies alcohol, illicit drug use. He is DNR/DNI.Refused further work-up for possible metastatic lung cancer in the past. Upon arrival to ED: Patient hypotensive, on 6 L of oxygen via nasal cannula, afebrile with leukocytosis of 10.8, H&H: 12.1/36.8, MCV: 102.2, platelet: 495, CMP shows potassium 3.1, CO2: 20, albumin: 1.9,  magnesium: 1.9, POC occult blood positive, CT of: Negative, GI panel, COVID-19, BC, UC, UA: Pending. CT chest, abdomen/pelvis shows 24 mm spiculated nodule in the right upper lobe suspicious for primary bronchogenic neoplasm, mildly progressive. 14 mm hyper enhancing lesion in the posterior right hepatic lobe, raising concern for metastasis. Bilateral adrenal nodules, progressive from May 2021, raising concern for metastatic disease. Patient was given IV fluid bolus, KCl and Imodium in ED. [facility] hospitalist consulted for admission for hypotension and dehydration.  Patient met as above with acute symptomatic diarrhea with negative work-up, likely viral in etiology and self-limiting.  Patient's diarrhea resolved quickly with supportive care IV fluids his blood pressure also returned to normal.  Unfortunately patient's respiratory status continues to worsen in the setting of untreated likely metastatic lung disease.  Patient had lengthy discussion with palliative care team and family, wishing to return home with hospice.  Over the following 24 hours patient's oxygen remained somewhat labile, at home his oxygen sats apparently are in the 80s per wife even on 6 L nasal cannula which is his baseline.  Oxygen was weaned down to his home 6 L and he remained without symptomatology, telemetry and vital signs were discontinued given patient is on hospice and otherwise clinically well.  Discussed close follow-up with palliative care in the outpatient setting, DME ordered per patient requests and palliative request.  Patient otherwise stable and agreeable for discharge home with hospice.  Discharge Diagnoses:  Principal Problem:   Dehydration Active Problems:   Macrocytic anemia   Hypokalemia   Hyperlipidemia   Anxiety   Tobacco abuse   BPH (benign prostatic hyperplasia)   Lung mass   Thrombocytosis   COPD (chronic obstructive pulmonary disease) (HCC)   Chronic hypoxemic respiratory failure (HCC)    Shortness of breath   Protein-calorie malnutrition, severe  Discharge Instructions  Discharge Instructions    Call MD for:  severe uncontrolled pain   Complete by: As directed    Diet general   Complete by: As directed    Discharge wound care:   Complete by: As directed    Daily as indicated   Increase activity slowly   Complete by: As directed      Allergies as of 11/30/2019   No Known Allergies     Medication List    STOP taking these medications   ibuprofen 200 MG tablet Commonly known as: ADVIL   senna-docusate 8.6-50 MG tablet Commonly known as: Senokot-S     TAKE these medications   albuterol 108 (90 Base) MCG/ACT inhaler Commonly known as: VENTOLIN HFA Inhale 2 puffs into the lungs every 6 (six) hours as needed for wheezing or shortness of breath.   albuterol (2.5 MG/3ML) 0.083% nebulizer solution Commonly known as: PROVENTIL Take 2.5 mg by nebulization every 6 (six) hours as needed for wheezing or shortness of breath.   Anoro Ellipta 62.5-25 MCG/INH Aepb Generic drug: umeclidinium-vilanterol Inhale 1 puff into the lungs daily.   aspirin EC 81 MG tablet Take 81 mg by mouth daily.   atorvastatin 10 MG tablet Commonly known as: LIPITOR Take 10 mg by mouth at bedtime.   diazepam 5 MG tablet Commonly known as: VALIUM Take 2.5-5 mg by mouth See admin instructions. Take 1/2-1 tablet (2.5-5 mg) every other day as needed for anxiety   diclofenac sodium 1 % Gel Commonly known as: Voltaren Apply 2 g 4 (four) times daily topically. What changed:   when to take this  reasons to take this   diphenhydramine-acetaminophen 25-500 MG Tabs tablet Commonly known as: TYLENOL PM Take 1 tablet by mouth at bedtime as needed (pain/sleep).   ferrous sulfate 325 (65 FE) MG tablet Take 325 mg by mouth daily with breakfast.   gabapentin 300 MG capsule Commonly known as: NEURONTIN Take 300-600 mg by mouth See admin instructions. Take one capsule (300 mg) by mouth  every morning and two capsules (600 mg) at night   guaiFENesin 100 MG/5ML Soln Commonly known as: ROBITUSSIN Take 5 mLs (100 mg total) by mouth every 4 (four) hours as needed for cough or to loosen phlegm.   HYDROcodone-acetaminophen 5-325 MG tablet Commonly known as: NORCO/VICODIN Take 1 tablet by mouth every 4 (four) hours as needed for moderate pain.   loperamide 2 MG capsule Commonly known as: IMODIUM Take 2 capsules (4 mg total) by mouth as needed for diarrhea or loose stools.   MAGNESIUM PO Take 1 tablet by mouth daily.   MELATONIN PO Take 1 tablet by mouth at bedtime.   multivitamin with minerals Tabs tablet Take 1 tablet by mouth daily.   naproxen sodium 220 MG tablet Commonly known as: ALEVE Take 220 mg by mouth 2 (two) times daily as needed (pain).   Narcan 4 MG/0.1ML Liqd nasal spray kit Generic drug: naloxone Place 1 spray into the nose once as needed (opioid overdose).   pantoprazole 40 MG tablet Commonly known as: PROTONIX Take 40 mg by mouth daily.   Theraworx Relief Liqd Apply 1 application topically daily.   Vitamin D 50 MCG (2000 UT) tablet Take 2,000 Units by mouth daily.            Durable Medical Equipment  (From admission, onward)         Start     Ordered   11/30/19 0908  DME Oxygen  Once  Question Answer Comment  Length of Need Lifetime   Mode or (Route) Nasal cannula   Liters per Minute 6   Frequency Continuous (stationary and portable oxygen unit needed)   Oxygen conserving device No   Oxygen delivery system Gas      11/30/19 0908           Discharge Care Instructions  (From admission, onward)         Start     Ordered   11/30/19 0000  Discharge wound care:       Comments: Daily as indicated   11/30/19 0908          No Known Allergies  Consultations:  Palliative care   Procedures/Studies: CT Angio Chest PE W and/or Wo Contrast  Result Date: 11/27/2019 CLINICAL DATA:  Weakness, abdominal pain,  diarrhea. Hypotension and hypoxia. Evaluate for PE. EXAM: CT ANGIOGRAPHY CHEST CT ABDOMEN AND PELVIS WITH CONTRAST TECHNIQUE: Multidetector CT imaging of the chest was performed using the standard protocol during bolus administration of intravenous contrast. Multiplanar CT image reconstructions and MIPs were obtained to evaluate the vascular anatomy. Multidetector CT imaging of the abdomen and pelvis was performed using the standard protocol during bolus administration of intravenous contrast. CONTRAST:  147m OMNIPAQUE IOHEXOL 350 MG/ML SOLN COMPARISON:  CTA chest dated 10/14/2019.  CT chest dated 07/15/2019. FINDINGS: CTA CHEST FINDINGS Cardiovascular: Satisfactory opacification of the bilateral pulmonary arteries to the segmental level. No evidence of pulmonary embolism. Study is not tailored for evaluation of the thoracic aorta. No evidence of thoracic aortic aneurysm. Atherosclerotic calcifications of the aortic arch. The heart is top-normal in size.  No pericardial effusion. Coronary atherosclerosis of the LAD and right coronary artery. Mediastinum/Nodes: 16 mm short axis subcarinal node, grossly unchanged, possibly reactive. No suspicious hilar lymphadenopathy. Visualized thyroid is unremarkable. Lungs/Pleura: 18 x 24 mm spiculated nodule in the right upper lobe (series 5/image 30), previously 18 x 20 mm, suspicious for primary bronchogenic neoplasm. Multifocal patchy opacities posterior right upper lobe, right middle lobe, and right lower lobe. This is progressive from the prior, which was previously isolated to the right middle and lower lobes. While multifocal infection/pneumonia is possible, when correlating with multiple CTs, multifocal invasive adenocarcinoma with a lepidic growth pattern could demonstrate this appearance. Superimposed small right pleural effusion, mildly increased. Small left pleural effusion, new/increased. Associated left lower lobe atelectasis. Moderate to severe centrilobular and  paraseptal emphysematous changes, upper lung predominant. 11 mm nodule at the left lung apex (series 5/image 14), unchanged, possibly reflecting apical pleural-parenchymal scarring. 6 mm nodule in the left lower lobe (series 5/image 89) and 12 mm nodule in the medial left lower lobe posterior to the aorta (series 5/image 77), previously 4 and 7 mm respectively, suspicious for metastases. No pneumothorax. Musculoskeletal: Mild degenerative changes of the thoracic spine. Review of the MIP images confirms the above findings. CT ABDOMEN and PELVIS FINDINGS Hepatobiliary: 14 mm hypoenhancing lesion in the posterior right hepatic lobe (series 8/image 22), raising concern for metastasis. Distended gallbladder, without associated inflammatory changes. No intrahepatic or extrahepatic ductal dilatation. Pancreas: Within normal limits. Spleen: Within normal limits. Adrenals/Urinary Tract: Low-density bilateral adrenal nodules, measuring up to 2.6 cm on the left and 2.2 cm on the right (series 8/images 34 and 36), progressive from 07/15/2019 and worrisome for metastatic disease. Kidneys are within normal limits.  No hydronephrosis. Layering 8 mm bladder calculus (series 8/image 85). Stomach/Bowel: Postsurgical changes at the GE junction. Stomach is grossly unremarkable. No evidence of bowel obstruction. Appendix  is not discretely visualized. Extensive colonic diverticulosis, without evidence of diverticulitis. Vascular/Lymphatic: No evidence of abdominal aortic aneurysm. Atherosclerotic calcifications of the abdominal aorta and branch vessels. No suspicious abdominopelvic lymphadenopathy. Reproductive: Prostate is unremarkable. Other: No abdominopelvic ascites. Musculoskeletal: Mild degenerative changes of the lumbar spine. Review of the MIP images confirms the above findings. IMPRESSION: No evidence of pulmonary embolism. 24 mm spiculated nodule in the right upper lobe, suspicious for primary bronchogenic neoplasm, mildly  progressive. Mildly progressive nodules in the left lower lobe, suspicious for metastases. Multifocal patchy opacities in the right lung, progressive across multiple studies. Given persistence, multifocal invasive adenocarcinoma demonstrating a lepidic growth pattern should be considered, although multifocal pneumonia is still possible. Associated small bilateral pleural effusions, new/progressive. Mildly prominent mediastinal node, grossly unchanged. 14 mm hypoenhancing lesion in the posterior right hepatic lobe, raising concern for metastasis. Bilateral adrenal nodules, progressive from May 2021, raising concern for metastatic disease. Aortic Atherosclerosis (ICD10-I70.0) and Emphysema (ICD10-J43.9). Electronically Signed   By: Julian Hy M.D.   On: 11/27/2019 17:39   CT ABDOMEN PELVIS W CONTRAST  Result Date: 11/27/2019 CLINICAL DATA:  Weakness, abdominal pain, diarrhea. Hypotension and hypoxia. Evaluate for PE. EXAM: CT ANGIOGRAPHY CHEST CT ABDOMEN AND PELVIS WITH CONTRAST TECHNIQUE: Multidetector CT imaging of the chest was performed using the standard protocol during bolus administration of intravenous contrast. Multiplanar CT image reconstructions and MIPs were obtained to evaluate the vascular anatomy. Multidetector CT imaging of the abdomen and pelvis was performed using the standard protocol during bolus administration of intravenous contrast. CONTRAST:  12m OMNIPAQUE IOHEXOL 350 MG/ML SOLN COMPARISON:  CTA chest dated 10/14/2019.  CT chest dated 07/15/2019. FINDINGS: CTA CHEST FINDINGS Cardiovascular: Satisfactory opacification of the bilateral pulmonary arteries to the segmental level. No evidence of pulmonary embolism. Study is not tailored for evaluation of the thoracic aorta. No evidence of thoracic aortic aneurysm. Atherosclerotic calcifications of the aortic arch. The heart is top-normal in size.  No pericardial effusion. Coronary atherosclerosis of the LAD and right coronary artery.  Mediastinum/Nodes: 16 mm short axis subcarinal node, grossly unchanged, possibly reactive. No suspicious hilar lymphadenopathy. Visualized thyroid is unremarkable. Lungs/Pleura: 18 x 24 mm spiculated nodule in the right upper lobe (series 5/image 30), previously 18 x 20 mm, suspicious for primary bronchogenic neoplasm. Multifocal patchy opacities posterior right upper lobe, right middle lobe, and right lower lobe. This is progressive from the prior, which was previously isolated to the right middle and lower lobes. While multifocal infection/pneumonia is possible, when correlating with multiple CTs, multifocal invasive adenocarcinoma with a lepidic growth pattern could demonstrate this appearance. Superimposed small right pleural effusion, mildly increased. Small left pleural effusion, new/increased. Associated left lower lobe atelectasis. Moderate to severe centrilobular and paraseptal emphysematous changes, upper lung predominant. 11 mm nodule at the left lung apex (series 5/image 14), unchanged, possibly reflecting apical pleural-parenchymal scarring. 6 mm nodule in the left lower lobe (series 5/image 89) and 12 mm nodule in the medial left lower lobe posterior to the aorta (series 5/image 77), previously 4 and 7 mm respectively, suspicious for metastases. No pneumothorax. Musculoskeletal: Mild degenerative changes of the thoracic spine. Review of the MIP images confirms the above findings. CT ABDOMEN and PELVIS FINDINGS Hepatobiliary: 14 mm hypoenhancing lesion in the posterior right hepatic lobe (series 8/image 22), raising concern for metastasis. Distended gallbladder, without associated inflammatory changes. No intrahepatic or extrahepatic ductal dilatation. Pancreas: Within normal limits. Spleen: Within normal limits. Adrenals/Urinary Tract: Low-density bilateral adrenal nodules, measuring up to 2.6 cm on the  left and 2.2 cm on the right (series 8/images 34 and 36), progressive from 07/15/2019 and worrisome  for metastatic disease. Kidneys are within normal limits.  No hydronephrosis. Layering 8 mm bladder calculus (series 8/image 85). Stomach/Bowel: Postsurgical changes at the GE junction. Stomach is grossly unremarkable. No evidence of bowel obstruction. Appendix is not discretely visualized. Extensive colonic diverticulosis, without evidence of diverticulitis. Vascular/Lymphatic: No evidence of abdominal aortic aneurysm. Atherosclerotic calcifications of the abdominal aorta and branch vessels. No suspicious abdominopelvic lymphadenopathy. Reproductive: Prostate is unremarkable. Other: No abdominopelvic ascites. Musculoskeletal: Mild degenerative changes of the lumbar spine. Review of the MIP images confirms the above findings. IMPRESSION: No evidence of pulmonary embolism. 24 mm spiculated nodule in the right upper lobe, suspicious for primary bronchogenic neoplasm, mildly progressive. Mildly progressive nodules in the left lower lobe, suspicious for metastases. Multifocal patchy opacities in the right lung, progressive across multiple studies. Given persistence, multifocal invasive adenocarcinoma demonstrating a lepidic growth pattern should be considered, although multifocal pneumonia is still possible. Associated small bilateral pleural effusions, new/progressive. Mildly prominent mediastinal node, grossly unchanged. 14 mm hypoenhancing lesion in the posterior right hepatic lobe, raising concern for metastasis. Bilateral adrenal nodules, progressive from May 2021, raising concern for metastatic disease. Aortic Atherosclerosis (ICD10-I70.0) and Emphysema (ICD10-J43.9). Electronically Signed   By: Julian Hy M.D.   On: 11/27/2019 17:39   DG Chest Port 1 View  Result Date: 11/27/2019 CLINICAL DATA:  Questionable sepsis. EXAM: PORTABLE CHEST 1 VIEW COMPARISON:  CT and radiographs from 10/14/2019 FINDINGS: Increased consolidation in the right lower lobe. As before, intermixed lucencies within the area of  consolidation. Similar right paratracheal opacity, likely related to spiculated mass seen on prior CT. Stable cardiomediastinal silhouette, partially obscured. Limited evaluation for right pleural effusion. No evidence of a left pleural effusion. No pneumothorax. Clips in the region of the gastroesophageal junction. IMPRESSION: 1. Increased consolidation within the right lower lobe, concerning for pneumonia. Intermixed lucencies within this consolidation, which is nonospecific by radiograph but may represent necrotizing pneumonia given findings on prior chest CT. Chest CT with contrast could further characterize, if clinically indicated. 2. Emphysematous disease. 3. Persistent right medial apical ill-defined opacity, likely correlating to the spiculated mass seen on prior CT. Electronically Signed   By: Margaretha Sheffield MD   On: 11/27/2019 15:15    Subjective: No acute issues or events overnight, respiratory status markedly improving, denies nausea, vomiting, diarrhea, constipation, headache, fever, chills.   Discharge Exam: Vitals:   11/30/19 0300 11/30/19 0656  BP: 104/66   Pulse: 92   Resp: 16   Temp: 98.5 F (36.9 C)   SpO2: (!) 83% (!) 82%   Vitals:   11/30/19 0100 11/30/19 0153 11/30/19 0300 11/30/19 0656  BP: 106/62  104/66   Pulse: 81 88 92   Resp: (!) 23 (!) 23 16   Temp: 98 F (36.7 C)  98.5 F (36.9 C)   TempSrc: Oral  Oral   SpO2: (!) 88% (!) 86% (!) 83% (!) 82%  Weight:   54.3 kg     General: Pt is alert, awake, not in acute distress Cardiovascular: RRR, S1/S2 +, no rubs, no gallops Respiratory: CTA bilaterally, no wheezing, no rhonchi Abdominal: Soft, NT, ND, bowel sounds + Extremities: no edema, no cyanosis    The results of significant diagnostics from this hospitalization (including imaging, microbiology, ancillary and laboratory) are listed below for reference.     Microbiology: Recent Results (from the past 240 hour(s))  Blood culture (routine single)  Status: None (Preliminary result)   Collection Time: 11/27/19  3:43 PM   Specimen: BLOOD  Result Value Ref Range Status   Specimen Description BLOOD RIGHT ANTECUBITAL  Final   Special Requests   Final    BOTTLES DRAWN AEROBIC ONLY Blood Culture results may not be optimal due to an inadequate volume of blood received in culture bottles   Culture   Final    NO GROWTH 3 DAYS Performed at [facility], [place] [address] [place], [place] [address]    Report Status PENDING  Incomplete  C Difficile Quick Screen w PCR reflex     Status: None   Collection Time: 11/27/19  4:00 PM   Specimen: Stool  Result Value Ref Range Status   C Diff antigen NEGATIVE NEGATIVE Final   C Diff toxin NEGATIVE NEGATIVE Final   C Diff interpretation No C. difficile detected.  Final    Comment: Performed at [facility], [place] [address] [place], [place] [address]  Respiratory Panel by RT PCR (Flu A&B, Covid) - Nasopharyngeal Swab     Status: None   Collection Time: 11/27/19  7:25 PM   Specimen: Nasopharyngeal Swab  Result Value Ref Range Status   SARS Coronavirus 2 by RT PCR NEGATIVE NEGATIVE Final    Comment: (NOTE) SARS-CoV-2 target nucleic acids are NOT DETECTED.  The SARS-CoV-2 RNA is generally detectable in upper respiratoy specimens during the acute phase of infection. The lowest concentration of SARS-CoV-2 viral copies this assay can detect is 131 copies/mL. A negative result does not preclude SARS-Cov-2 infection and should not be used as the sole basis for treatment or other patient management decisions. A negative result may occur with  improper specimen collection/handling, submission of specimen other than nasopharyngeal swab, presence of viral mutation(s) within the areas targeted by this assay, and inadequate number of viral copies (<131 copies/mL). A negative result must be combined with clinical observations, patient history, and epidemiological information. The expected result is  Negative.  Fact Sheet for Patients:  PinkCheek.be  Fact Sheet for Healthcare Providers:  GravelBags.it  This test is no t yet approved or cleared by the [place] FDA and  has been authorized for detection and/or diagnosis of SARS-CoV-2 by FDA under an Emergency Use Authorization (EUA). This EUA will remain  in effect (meaning this test can be used) for the duration of the COVID-19 declaration under Section 564(b)(1) of the Act, 21 U.S.C. section 360bbb-3(b)(1), unless the authorization is terminated or revoked sooner.     Influenza A by PCR NEGATIVE NEGATIVE Final   Influenza B by PCR NEGATIVE NEGATIVE Final    Comment: (NOTE) The Xpert Xpress SARS-CoV-2/FLU/RSV assay is intended as an aid in  the diagnosis of influenza from Nasopharyngeal swab specimens and  should not be used as a sole basis for treatment. Nasal washings and  aspirates are unacceptable for Xpert Xpress SARS-CoV-2/FLU/RSV  testing.  Fact Sheet for Patients: PinkCheek.be  Fact Sheet for Healthcare Providers: GravelBags.it  This test is not yet approved or cleared by the [place] FDA and  has been authorized for detection and/or diagnosis of SARS-CoV-2 by  FDA under an Emergency Use Authorization (EUA). This EUA will remain  in effect (meaning this test can be used) for the duration of the  Covid-19 declaration under Section 564(b)(1) of the Act, 21  U.S.C. section 360bbb-3(b)(1), unless the authorization is  terminated or revoked. Performed at [facility], [place] [address], [place], [place]  [address]   Urine culture     Status: Abnormal   Collection Time: 11/27/19 11:51 PM   Specimen: Urine, Random  Result Value Ref Range Status   Specimen Description URINE, RANDOM  Final   Special Requests   Final    NONE Performed at [facility], [place] [address], [place], [place]  [address]    Culture MULTIPLE SPECIES PRESENT, SUGGEST RECOLLECTION (A)  Final   Report Status 11/28/2019 FINAL  Final     Labs: BNP (last 3 results) Recent Labs    10/14/19 1739  BNP 812.7*   Basic Metabolic Panel: Recent Labs  Lab 11/27/19 1435 11/28/19 2100  NA 139 141  K 3.1* 3.4*  CL 107 114*  CO2 20* 19*  GLUCOSE 107* 77  BUN 19 14  CREATININE 0.95 0.77  CALCIUM 8.3* 7.3*  MG 1.9  --    Liver Function Tests: Recent Labs  Lab 11/27/19 1435  AST 23  ALT 16  ALKPHOS 112  BILITOT 0.6  PROT 5.4*  ALBUMIN 1.9*   No results for input(s): LIPASE, AMYLASE in the last 168 hours. No results for input(s): AMMONIA in the last 168 hours. CBC: Recent Labs  Lab 11/27/19 1435 11/28/19 0401  WBC 10.8* 7.9  NEUTROABS 8.6*  --   HGB 12.1* 10.2*  HCT 36.8* 30.8*  MCV 102.2* 105.8*  PLT 495* 476*   Cardiac Enzymes: No results for input(s): CKTOTAL, CKMB, CKMBINDEX, TROPONINI in the last 168 hours. BNP: Invalid input(s): POCBNP CBG: No results for input(s): GLUCAP in the last 168 hours. D-Dimer No results for input(s): DDIMER in the last 72 hours. Hgb A1c No results for input(s): HGBA1C in the last 72 hours. Lipid Profile No results for input(s): CHOL, HDL, LDLCALC, TRIG, CHOLHDL, LDLDIRECT in the last 72 hours. Thyroid function studies No results for input(s): TSH, T4TOTAL, T3FREE, THYROIDAB in the last 72 hours.  Invalid input(s): FREET3 Anemia work up No results for input(s): VITAMINB12, FOLATE, FERRITIN, TIBC, IRON, RETICCTPCT in the last 72 hours. Urinalysis    Component Value Date/Time   COLORURINE AMBER (A) 11/27/2019 2358   APPEARANCEUR HAZY (A) 11/27/2019 2358   LABSPEC 1.044 (H) 11/27/2019 2358   PHURINE 5.0 11/27/2019 2358   GLUCOSEU NEGATIVE 11/27/2019 2358   HGBUR NEGATIVE 11/27/2019 2358   BILIRUBINUR NEGATIVE 11/27/2019 2358   KETONESUR NEGATIVE 11/27/2019 2358   PROTEINUR NEGATIVE 11/27/2019 2358   UROBILINOGEN 0.2 08/20/2011 1933    NITRITE NEGATIVE 11/27/2019 2358   LEUKOCYTESUR NEGATIVE 11/27/2019 2358   Sepsis Labs Invalid input(s): PROCALCITONIN,  WBC,  LACTICIDVEN Microbiology Recent Results (from the past 240 hour(s))  Blood culture (routine single)     Status: None (Preliminary result)   Collection Time: 11/27/19  3:43 PM   Specimen: BLOOD  Result Value Ref Range Status   Specimen Description BLOOD RIGHT ANTECUBITAL  Final   Special Requests   Final    BOTTLES DRAWN AEROBIC ONLY Blood Culture results may not be optimal due to an inadequate volume of blood received in culture bottles   Culture   Final    NO GROWTH 3 DAYS Performed at [facility], [place] [address] [place], [place] [address]    Report Status PENDING  Incomplete  C Difficile Quick Screen w PCR reflex     Status: None   Collection Time: 11/27/19  4:00 PM   Specimen: Stool  Result Value Ref Range Status   C Diff antigen NEGATIVE NEGATIVE Final   C  Diff toxin NEGATIVE NEGATIVE Final   C Diff interpretation No C. difficile detected.  Final    Comment: Performed at [facility], [place] [address], [place], [place] [address]  Respiratory Panel by RT PCR (Flu A&B, Covid) - Nasopharyngeal Swab     Status: None   Collection Time: 11/27/19  7:25 PM   Specimen: Nasopharyngeal Swab  Result Value Ref Range Status   SARS Coronavirus 2 by RT PCR NEGATIVE NEGATIVE Final    Comment: (NOTE) SARS-CoV-2 target nucleic acids are NOT DETECTED.  The SARS-CoV-2 RNA is generally detectable in upper respiratoy specimens during the acute phase of infection. The lowest concentration of SARS-CoV-2 viral copies this assay can detect is 131 copies/mL. A negative result does not preclude SARS-Cov-2 infection and should not be used as the sole basis for treatment or other patient management decisions. A negative result may occur with  improper specimen collection/handling, submission of specimen other than nasopharyngeal swab, presence of viral mutation(s)  within the areas targeted by this assay, and inadequate number of viral copies (<131 copies/mL). A negative result must be combined with clinical observations, patient history, and epidemiological information. The expected result is Negative.  Fact Sheet for Patients:  PinkCheek.be  Fact Sheet for Healthcare Providers:  GravelBags.it  This test is no t yet approved or cleared by the [place] FDA and  has been authorized for detection and/or diagnosis of SARS-CoV-2 by FDA under an Emergency Use Authorization (EUA). This EUA will remain  in effect (meaning this test can be used) for the duration of the COVID-19 declaration under Section 564(b)(1) of the Act, 21 U.S.C. section 360bbb-3(b)(1), unless the authorization is terminated or revoked sooner.     Influenza A by PCR NEGATIVE NEGATIVE Final   Influenza B by PCR NEGATIVE NEGATIVE Final    Comment: (NOTE) The Xpert Xpress SARS-CoV-2/FLU/RSV assay is intended as an aid in  the diagnosis of influenza from Nasopharyngeal swab specimens and  should not be used as a sole basis for treatment. Nasal washings and  aspirates are unacceptable for Xpert Xpress SARS-CoV-2/FLU/RSV  testing.  Fact Sheet for Patients: PinkCheek.be  Fact Sheet for Healthcare Providers: GravelBags.it  This test is not yet approved or cleared by the [place] FDA and  has been authorized for detection and/or diagnosis of SARS-CoV-2 by  FDA under an Emergency Use Authorization (EUA). This EUA will remain  in effect (meaning this test can be used) for the duration of the  Covid-19 declaration under Section 564(b)(1) of the Act, 21  U.S.C. section 360bbb-3(b)(1), unless the authorization is  terminated or revoked. Performed at [facility], [place] [address], [place], [place] [address]   Urine culture     Status: Abnormal    Collection Time: 11/27/19 11:51 PM   Specimen: Urine, Random  Result Value Ref Range Status   Specimen Description URINE, RANDOM  Final   Special Requests   Final    NONE Performed at [facility], [place] [address], [place], [place] [address]    Culture MULTIPLE SPECIES PRESENT, SUGGEST RECOLLECTION (A)  Final   Report Status 11/28/2019 FINAL  Final     Time coordinating discharge: Over 30 minutes  SIGNED:   Little Ishikawa, DO [facility] 11/30/2019, 3:25 PM Pager   If 7PM-7AM, please contact night-coverage [email or web address]

## 2019-11-30 NOTE — Progress Notes (Signed)
Patient is currently on 6 L for per MD orders to transfer home with daughter and Hospice care. Plan to give PRN for air hunger as needed.

## 2019-12-01 ENCOUNTER — Other Ambulatory Visit: Payer: Self-pay

## 2019-12-01 ENCOUNTER — Other Ambulatory Visit: Payer: Medicare HMO | Admitting: Nurse Practitioner

## 2019-12-02 LAB — CULTURE, BLOOD (SINGLE): Culture: NO GROWTH

## 2019-12-04 NOTE — Progress Notes (Deleted)
Synopsis: Referred in *** for *** by [name], MD  Subjective:   PATIENT ID: Curtis Gentry GENDER: male DOB: 1945/06/12, MRN: [id number]  No chief complaint on file.   HPI  ***  Past Medical History:  Diagnosis Date  . Abnormality of gait 05/03/2015  . Anemia   . ANEMIA-UNSPECIFIED 09/06/2008   Qualifier: Diagnosis of  By: Henrene Pastor MD, Docia Chuck   . Anxiety   . Arthritis   . BPH (benign prostatic hyperplasia) 10/03/2016  . DYSPHAGIA 09/06/2008   Qualifier: Diagnosis of  By: Henrene Pastor MD, Docia Chuck   . Erectile dysfunction   . Hereditary and idiopathic peripheral neuropathy 05/03/2015  . Hyperlipidemia   . Hypokalemia 08/21/2011  . Peptic ulcer disease   . Perforated ulcer ([place])   . SDH (subdural hematoma) ([place]) 10/03/2016  . Slurred speech 10/03/2016  . 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 Syncope and collapse 08/20/2011  . Tobacco abuse 10/03/2016  . Unspecified disease of spinal cord    myelopahty, cervical spine     Family History  Problem Relation Age of Onset  . Stroke Mother   . COPD Father        ?  Marland Kitchen Heart attack Father   . CAD Father   . Diabetes Other        family members  . CAD Other        family members  . Colon cancer Neg Hx   . Neuropathy Neg Hx      Past Surgical History:  Procedure Laterality Date  . BACK SURGERY  2011   3 level ACD with fusion and plating  . HEMORRHOID SURGERY    . PARTIAL GASTRECTOMY      Social History   Socioeconomic History  . Marital status: Divorced    Spouse name: Not on file  . Number of children: 3  . Years of education: Not on file  . Highest education level: Not on file  Occupational History  . Occupation: retired  Tobacco Use  . Smoking status: Current Every Day Smoker    Packs/day: 1.00    Years: 50.00    Pack years: 50.00  . Smokeless tobacco: Current User  Vaping Use  . Vaping Use: Never used  Substance and Sexual Activity  . Alcohol use: Not Currently  . Drug use: No  . Sexual  activity: Never  Other Topics Concern  . Not on file  Social History Narrative  . Not on file   Social Determinants of Health   Financial Resource Strain:   . Difficulty of Paying Living Expenses: Not on file  Food Insecurity:   . Worried About Charity fundraiser in the Last Year: Not on file  . Ran Out of Food in the Last Year: Not on file  Transportation Needs:   . Lack of Transportation (Medical): Not on file  . Lack of Transportation (Non-Medical): Not on file  Physical Activity:   . Days of Exercise per Week: Not on file  . Minutes of Exercise per Session: Not on file  Stress:   . Feeling of Stress : Not on file  Social Connections:   . Frequency of Communication with Friends and Family: Not on file  . Frequency of Social Gatherings with Friends and Family: Not on file  . Attends Religious Services: Not on file  . Active Member of Clubs or Organizations: Not on file  . Attends Archivist Meetings: Not on file  . Marital  Status: Not on file  Intimate Partner Violence:   . Fear of Current or Ex-Partner: Not on file  . Emotionally Abused: Not on file  . Physically Abused: Not on file  . Sexually Abused: Not on file     No Known Allergies   Outpatient Medications Prior to Visit  Medication Sig Dispense Refill  . albuterol (PROVENTIL) (2.5 MG/3ML) 0.083% nebulizer solution Take 2.5 mg by nebulization every 6 (six) hours as needed for wheezing or shortness of breath.    Marland Kitchen albuterol (VENTOLIN HFA) 108 (90 Base) MCG/ACT inhaler Inhale 2 puffs into the lungs every 6 (six) hours as needed for wheezing or shortness of breath.    Marland Kitchen aspirin EC 81 MG tablet Take 81 mg by mouth daily.    Marland Kitchen atorvastatin (LIPITOR) 10 MG tablet Take 10 mg by mouth at bedtime.     . Cholecalciferol (VITAMIN D) 2000 units tablet Take 2,000 Units by mouth daily.    . diazepam (VALIUM) 5 MG tablet Take 2.5-5 mg by mouth See admin instructions. Take 1/2-1 tablet (2.5-5 mg) every other day as  needed for anxiety    . diclofenac sodium (VOLTAREN) 1 % GEL Apply 2 g 4 (four) times daily topically. (Patient taking differently: Apply 2 g topically 4 (four) times daily as needed (pain). ) 100 g 0  . diphenhydramine-acetaminophen (TYLENOL PM) 25-500 MG TABS tablet Take 1 tablet by mouth at bedtime as needed (pain/sleep).    . ferrous sulfate 325 (65 FE) MG tablet Take 325 mg by mouth daily with breakfast.    . gabapentin (NEURONTIN) 300 MG capsule Take 300-600 mg by mouth See admin instructions. Take one capsule (300 mg) by mouth every morning and two capsules (600 mg) at night    . guaiFENesin (ROBITUSSIN) 100 MG/5ML SOLN Take 5 mLs (100 mg total) by mouth every 4 (four) hours as needed for cough or to loosen phlegm. (Patient not taking: Reported on 11/27/2019) 236 mL 0  . Homeopathic Products (THERAWORX RELIEF) LIQD Apply 1 application topically daily.    Marland Kitchen HYDROcodone-acetaminophen (NORCO/VICODIN) 5-325 MG tablet Take 1 tablet by mouth every 4 (four) hours as needed for moderate pain.   0  . loperamide (IMODIUM) 2 MG capsule Take 2 capsules (4 mg total) by mouth as needed for diarrhea or loose stools. 30 capsule 0  . MAGNESIUM PO Take 1 tablet by mouth daily.    Marland Kitchen MELATONIN PO Take 1 tablet by mouth at bedtime.    . Multiple Vitamin (MULTIVITAMIN WITH MINERALS) TABS tablet Take 1 tablet by mouth daily.    . naloxone (NARCAN) 4 MG/0.1ML LIQD nasal spray kit Place 1 spray into the nose once as needed (opioid overdose).    . naproxen sodium (ALEVE) 220 MG tablet Take 220 mg by mouth 2 (two) times daily as needed (pain).    . pantoprazole (PROTONIX) 40 MG tablet Take 40 mg by mouth daily.   2  . umeclidinium-vilanterol (ANORO ELLIPTA) 62.5-25 MCG/INH AEPB Inhale 1 puff into the lungs daily.     No facility-administered medications prior to visit.    ROS   Objective:  Physical Exam   There were no vitals filed for this visit.   on *** LPM *** RA BMI Readings from Last 3 Encounters:    11/30/19 18.20 kg/m  10/17/19 15.39 kg/m  12/09/17 18.50 kg/m   Wt Readings from Last 3 Encounters:  11/30/19 119 lb 11.4 oz (54.3 kg)  10/17/19 101 lb 3.1 oz (45.9 kg)  12/09/17 118 lb 1.9 oz (53.6 kg)     CBC    Component Value Date/Time   WBC 7.9 11/28/2019 0401   RBC 2.91 (L) 11/28/2019 0401   HGB 10.2 (L) 11/28/2019 0401   HCT 30.8 (L) 11/28/2019 0401   PLT 476 (H) 11/28/2019 0401   MCV 105.8 (H) 11/28/2019 0401   MCH 35.1 (H) 11/28/2019 0401   MCHC 33.1 11/28/2019 0401   RDW 14.7 11/28/2019 0401   LYMPHSABS 1.3 11/27/2019 1435   MONOABS 0.7 11/27/2019 1435   EOSABS 0.1 11/27/2019 1435   BASOSABS 0.0 11/27/2019 1435    ***  Chest Imaging: ***  Pulmonary Functions Testing Results: No flowsheet data found.  FeNO: ***  Pathology: ***  Echocardiogram: ***  Heart Catheterization: ***    Assessment & Plan:   No diagnosis found.  Discussion: ***   Current Outpatient Medications:  .  albuterol (PROVENTIL) (2.5 MG/3ML) 0.083% nebulizer solution, Take 2.5 mg by nebulization every 6 (six) hours as needed for wheezing or shortness of breath., Disp: , Rfl:  .  albuterol (VENTOLIN HFA) 108 (90 Base) MCG/ACT inhaler, Inhale 2 puffs into the lungs every 6 (six) hours as needed for wheezing or shortness of breath., Disp: , Rfl:  .  aspirin EC 81 MG tablet, Take 81 mg by mouth daily., Disp: , Rfl:  .  atorvastatin (LIPITOR) 10 MG tablet, Take 10 mg by mouth at bedtime. , Disp: , Rfl:  .  Cholecalciferol (VITAMIN D) 2000 units tablet, Take 2,000 Units by mouth daily., Disp: , Rfl:  .  diazepam (VALIUM) 5 MG tablet, Take 2.5-5 mg by mouth See admin instructions. Take 1/2-1 tablet (2.5-5 mg) every other day as needed for anxiety, Disp: , Rfl:  .  diclofenac sodium (VOLTAREN) 1 % GEL, Apply 2 g 4 (four) times daily topically. (Patient taking differently: Apply 2 g topically 4 (four) times daily as needed (pain). ), Disp: 100 g, Rfl: 0 .  diphenhydramine-acetaminophen  (TYLENOL PM) 25-500 MG TABS tablet, Take 1 tablet by mouth at bedtime as needed (pain/sleep)., Disp: , Rfl:  .  ferrous sulfate 325 (65 FE) MG tablet, Take 325 mg by mouth daily with breakfast., Disp: , Rfl:  .  gabapentin (NEURONTIN) 300 MG capsule, Take 300-600 mg by mouth See admin instructions. Take one capsule (300 mg) by mouth every morning and two capsules (600 mg) at night, Disp: , Rfl:  .  guaiFENesin (ROBITUSSIN) 100 MG/5ML SOLN, Take 5 mLs (100 mg total) by mouth every 4 (four) hours as needed for cough or to loosen phlegm. (Patient not taking: Reported on 11/27/2019), Disp: 236 mL, Rfl: 0 .  Homeopathic Products (THERAWORX RELIEF) LIQD, Apply 1 application topically daily., Disp: , Rfl:  .  HYDROcodone-acetaminophen (NORCO/VICODIN) 5-325 MG tablet, Take 1 tablet by mouth every 4 (four) hours as needed for moderate pain. , Disp: , Rfl: 0 .  loperamide (IMODIUM) 2 MG capsule, Take 2 capsules (4 mg total) by mouth as needed for diarrhea or loose stools., Disp: 30 capsule, Rfl: 0 .  MAGNESIUM PO, Take 1 tablet by mouth daily., Disp: , Rfl:  .  MELATONIN PO, Take 1 tablet by mouth at bedtime., Disp: , Rfl:  .  Multiple Vitamin (MULTIVITAMIN WITH MINERALS) TABS tablet, Take 1 tablet by mouth daily., Disp: , Rfl:  .  naloxone (NARCAN) 4 MG/0.1ML LIQD nasal spray kit, Place 1 spray into the nose once as needed (opioid overdose)., Disp: , Rfl:  .  naproxen sodium (ALEVE)  220 MG tablet, Take 220 mg by mouth 2 (two) times daily as needed (pain)., Disp: , Rfl:  .  pantoprazole (PROTONIX) 40 MG tablet, Take 40 mg by mouth daily. , Disp: , Rfl: 2 .  umeclidinium-vilanterol (ANORO ELLIPTA) 62.5-25 MCG/INH AEPB, Inhale 1 puff into the lungs daily., Disp: , Rfl:   I spent *** minutes dedicated to the care of this patient on the date of this encounter to include pre-visit review of records, face-to-face time with the patient discussing conditions above, post visit ordering of testing, clinical documentation  with the electronic health record, making appropriate referrals as documented, and communicating necessary findings to members of the patients care team.   Garner Nash, DO [facility] 12/04/2019 12:20 PM

## 2019-12-05 ENCOUNTER — Institutional Professional Consult (permissible substitution): Payer: Medicare HMO | Admitting: Pulmonary Disease

## 2019-12-26 DEATH — deceased
